# Patient Record
Sex: Male | Born: 1950 | Race: White | Hispanic: No | Marital: Single | Smoking: Former smoker
Health system: Southern US, Community
[De-identification: ages and names within clinical notes are randomized; demographics above are authoritative.]

## PROBLEM LIST (undated history)

## (undated) DIAGNOSIS — D126 Benign neoplasm of colon, unspecified: Secondary | ICD-10-CM

## (undated) DIAGNOSIS — T4145XA Adverse effect of unspecified anesthetic, initial encounter: Secondary | ICD-10-CM

## (undated) DIAGNOSIS — E230 Hypopituitarism: Principal | ICD-10-CM

## (undated) DIAGNOSIS — T8859XA Other complications of anesthesia, initial encounter: Secondary | ICD-10-CM

## (undated) DIAGNOSIS — J45909 Unspecified asthma, uncomplicated: Secondary | ICD-10-CM

## (undated) DIAGNOSIS — F32A Depression, unspecified: Secondary | ICD-10-CM

## (undated) DIAGNOSIS — K219 Gastro-esophageal reflux disease without esophagitis: Secondary | ICD-10-CM

## (undated) DIAGNOSIS — R519 Headache, unspecified: Secondary | ICD-10-CM

## (undated) DIAGNOSIS — F419 Anxiety disorder, unspecified: Secondary | ICD-10-CM

## (undated) DIAGNOSIS — E039 Hypothyroidism, unspecified: Secondary | ICD-10-CM

## (undated) DIAGNOSIS — E232 Diabetes insipidus: Secondary | ICD-10-CM

## (undated) DIAGNOSIS — M1990 Unspecified osteoarthritis, unspecified site: Secondary | ICD-10-CM

## (undated) DIAGNOSIS — F329 Major depressive disorder, single episode, unspecified: Secondary | ICD-10-CM

## (undated) DIAGNOSIS — R51 Headache: Secondary | ICD-10-CM

## (undated) DIAGNOSIS — E785 Hyperlipidemia, unspecified: Secondary | ICD-10-CM

## (undated) DIAGNOSIS — IMO0001 Reserved for inherently not codable concepts without codable children: Secondary | ICD-10-CM

## (undated) DIAGNOSIS — H548 Legal blindness, as defined in USA: Secondary | ICD-10-CM

## (undated) HISTORY — DX: Benign neoplasm of colon, unspecified: D12.6

## (undated) HISTORY — DX: Major depressive disorder, single episode, unspecified: F32.9

## (undated) HISTORY — DX: Depression, unspecified: F32.A

## (undated) HISTORY — PX: BACK SURGERY: SHX140

## (undated) HISTORY — PX: CRANIOTOMY: SHX93

## (undated) HISTORY — DX: Unspecified osteoarthritis, unspecified site: M19.90

## (undated) HISTORY — DX: Hyperlipidemia, unspecified: E78.5

## (undated) HISTORY — PX: APPENDECTOMY: SHX54

## (undated) HISTORY — DX: Hypopituitarism: E23.0

---

## 1996-10-26 HISTORY — PX: SPINE SURGERY: SHX786

## 1999-12-30 ENCOUNTER — Ambulatory Visit (HOSPITAL_COMMUNITY): Admission: RE | Admit: 1999-12-30 | Discharge: 1999-12-30 | Payer: Self-pay | Admitting: General Surgery

## 1999-12-30 ENCOUNTER — Encounter: Payer: Self-pay | Admitting: General Surgery

## 2000-01-28 ENCOUNTER — Encounter (INDEPENDENT_AMBULATORY_CARE_PROVIDER_SITE_OTHER): Payer: Self-pay | Admitting: *Deleted

## 2000-01-28 ENCOUNTER — Ambulatory Visit (HOSPITAL_COMMUNITY): Admission: RE | Admit: 2000-01-28 | Discharge: 2000-01-29 | Payer: Self-pay | Admitting: General Surgery

## 2000-01-28 ENCOUNTER — Encounter: Payer: Self-pay | Admitting: General Surgery

## 2000-02-01 ENCOUNTER — Emergency Department (HOSPITAL_COMMUNITY): Admission: EM | Admit: 2000-02-01 | Discharge: 2000-02-01 | Payer: Self-pay | Admitting: Emergency Medicine

## 2000-07-16 ENCOUNTER — Ambulatory Visit (HOSPITAL_COMMUNITY): Admission: RE | Admit: 2000-07-16 | Discharge: 2000-07-16 | Payer: Self-pay | Admitting: Cardiology

## 2000-10-26 HISTORY — PX: CHOLECYSTECTOMY: SHX55

## 2001-02-22 ENCOUNTER — Encounter: Admission: RE | Admit: 2001-02-22 | Discharge: 2001-02-22 | Payer: Self-pay | Admitting: Gastroenterology

## 2001-02-22 ENCOUNTER — Encounter: Payer: Self-pay | Admitting: Gastroenterology

## 2001-04-05 ENCOUNTER — Emergency Department (HOSPITAL_COMMUNITY): Admission: EM | Admit: 2001-04-05 | Discharge: 2001-04-05 | Payer: Self-pay | Admitting: Emergency Medicine

## 2001-10-10 ENCOUNTER — Emergency Department (HOSPITAL_COMMUNITY): Admission: EM | Admit: 2001-10-10 | Discharge: 2001-10-10 | Payer: Self-pay

## 2001-10-10 ENCOUNTER — Encounter: Payer: Self-pay | Admitting: Emergency Medicine

## 2002-01-12 ENCOUNTER — Encounter: Payer: Self-pay | Admitting: Family Medicine

## 2002-01-12 ENCOUNTER — Encounter: Admission: RE | Admit: 2002-01-12 | Discharge: 2002-01-12 | Payer: Self-pay | Admitting: Family Medicine

## 2002-01-19 ENCOUNTER — Emergency Department (HOSPITAL_COMMUNITY): Admission: EM | Admit: 2002-01-19 | Discharge: 2002-01-19 | Payer: Self-pay | Admitting: *Deleted

## 2002-01-19 ENCOUNTER — Encounter: Payer: Self-pay | Admitting: *Deleted

## 2002-01-26 ENCOUNTER — Encounter: Payer: Self-pay | Admitting: Orthopedic Surgery

## 2002-01-26 ENCOUNTER — Encounter: Admission: RE | Admit: 2002-01-26 | Discharge: 2002-01-26 | Payer: Self-pay | Admitting: Orthopedic Surgery

## 2002-10-26 DIAGNOSIS — D126 Benign neoplasm of colon, unspecified: Secondary | ICD-10-CM

## 2002-10-26 HISTORY — DX: Benign neoplasm of colon, unspecified: D12.6

## 2003-01-20 ENCOUNTER — Emergency Department (HOSPITAL_COMMUNITY): Admission: EM | Admit: 2003-01-20 | Discharge: 2003-01-20 | Payer: Self-pay | Admitting: Emergency Medicine

## 2004-07-17 ENCOUNTER — Ambulatory Visit: Payer: Self-pay | Admitting: Psychiatry

## 2004-07-17 ENCOUNTER — Inpatient Hospital Stay (HOSPITAL_COMMUNITY): Admission: RE | Admit: 2004-07-17 | Discharge: 2004-07-23 | Payer: Self-pay | Admitting: Psychiatry

## 2004-07-24 ENCOUNTER — Other Ambulatory Visit (HOSPITAL_COMMUNITY): Admission: RE | Admit: 2004-07-24 | Discharge: 2004-10-22 | Payer: Self-pay | Admitting: Psychiatry

## 2004-10-26 HISTORY — PX: KNEE ARTHROSCOPY: SUR90

## 2005-07-03 ENCOUNTER — Encounter: Admission: RE | Admit: 2005-07-03 | Discharge: 2005-07-03 | Payer: Self-pay | Admitting: Internal Medicine

## 2007-10-27 HISTORY — PX: JOINT REPLACEMENT: SHX530

## 2007-11-30 ENCOUNTER — Inpatient Hospital Stay (HOSPITAL_COMMUNITY): Admission: RE | Admit: 2007-11-30 | Discharge: 2007-12-04 | Payer: Self-pay | Admitting: Orthopedic Surgery

## 2008-01-24 ENCOUNTER — Ambulatory Visit (HOSPITAL_COMMUNITY): Admission: RE | Admit: 2008-01-24 | Discharge: 2008-01-25 | Payer: Self-pay | Admitting: Orthopedic Surgery

## 2010-04-22 ENCOUNTER — Emergency Department (HOSPITAL_COMMUNITY): Admission: EM | Admit: 2010-04-22 | Discharge: 2010-04-22 | Payer: Self-pay | Admitting: Emergency Medicine

## 2010-10-26 HISTORY — PX: KNEE ARTHROSCOPY: SUR90

## 2011-03-10 NOTE — Op Note (Signed)
Lawrence Moore, Lawrence Moore             ACCOUNT NO.:  [id number]   MEDICAL RECORD NO.:  [id number]          PATIENT TYPE:  INP   LOCATION:  0012                         FACILITY:  [facility]   PHYSICIAN:  Madlyn Frankel. Charlann Boxer, M.D.  DATE OF BIRTH:  04-18-1951   DATE OF PROCEDURE:  11/30/2007  DATE OF DISCHARGE:                               OPERATIVE REPORT   PREOPERATIVE DIAGNOSIS:  Left knee osteoarthritis.   POSTOPERATIVE DIAGNOSIS:  Left knee osteoarthritis.   PROCEDURE:  Left total knee replacement.   COMPONENTS USED:  DePuy rotating platform posterior stabilized knee  system size 4 femur, 4 tibia, 10-mm insert and a 41 patellar button.   SURGEON:  Madlyn Frankel. Charlann Boxer, M.D.   ASSISTANT:  Dwyane Luo, PA   ANESTHESIA:  Duramorph spinal.   DRAINS:  x1.   COMPLICATIONS:  None.   BLOOD LOSS:  50 mL.   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  He had repeated injections with persistent  recurrent swelling and pain posteriorlywith swelling, no signs of  infection.  Given this, persistent discomfort despite conservative  attempts he wished to proceed with definitive measures predictable  definitive results knee replacement discussed, the risks of infection,  DVT, component failure, need for revision surgery, knee stiffness, need  for revision surgery were all discussed.  Consent was obtained.   PROCEDURE IN DETAIL:  The patient brought to the operative theater.  Once adequate anesthesia and preoperative antibiotics, Ancef,  administered the patient was positioned supine and the left thigh  tourniquet placed.  Left lower extremity was pre scrubbed and prepped  and draped in sterile fashion.  Midline incision was made followed by  median parapatellar arthrotomy with patella subluxation rather than  eversion.  Following my initial debridement, I attended to the patella  first with precut measurement 26  to 27 mm, I resected down to 15 mm and  the 41 button.  With the button in place, the caliper measurement was  back to within normal 26 to 27 mm.  Then I attended to the femur.  The  canal was opened and irrigated.  Intramedullary rod was then passed.  At  5 degrees of valgus I resected 10 mm of bone off the distal femur.   I sized the femur to be a size 4 based on the posterior condylar axis  despite a lateral tendency to degenerative change.  There was not an  excessive hyperplastic appearance of the lateral distal femur.   I then made my anterior-posterior chamfer cuts based on this posterior  condylar axis without notching or complication.  Final box cut was made  based off the lateral aspect distal femur.  Attention was now directed  tibia with the tibia subluxated anteriorly, continued meniscectomies  were carried out followed by removal of the cruciate ligaments.   Following this I attended tot he tibia,  I placed an extramedullary  guide and made a cut off the proximal tibia removing approximately 8 mm  off the medial or high side.  Marland Kitchen  I then checked to make sure I had 10 mm  extension block to come out to full extension to make sure that the knee  would come out to full extension with 10 mm block.  Once this was done,  I went ahead and used a size 4 tibial tray. I pinned it in position,  checked the alignment with an alignment rod, was happy that was  perpendicular in both planes.  I then drilled and keel punched this area  and did a trial reduction with 4 femur 4 tibia and 10 mm insert.  The  knee came out to full extension.  The knee ligaments were stable patella  tracked without application of pressure.   This point all trial components removed the synovial capsule layer was  injected 0.25% Marcaine with epinephrine and 1 mL of Toradol for a total  of 60 mL.  The knee was copiously irrigated normal saline solution.  The  final debridements  were carried out.  Cement was mixed and the final  components were cemented into position.  The knee was brought to  extension with 10 mm insert.  Extruded cement was removed.  Once the  cement had cured, I removed excessive cement from the knee.  Once I was  happy that I was unable to visualize any loose fragments of cement the  final 10-mm insert was placed.  The knee was then reirrigated.  A medium  Hemovac drain was placed deep.  The knee was then brought to flexion  with the knee in flexion and extensor mechanism reapproximated using 1-0  Vicryl.  2-0 Vicryl was used in the subcu layer followed by 4-0  Monocryl.  The patient's knee was then cleaned, dried and dressed  sterilely with Steri-Strips and sterile wrap.  He was brought to  recovery room in stable condition.      Madlyn Frankel Charlann Boxer, M.D.  Electronically Signed     MDO/MEDQ  D:  11/30/2007  T:  12/01/2007  Job:  [id number]

## 2011-03-10 NOTE — H&P (Signed)
Lawrence Moore, Lawrence Moore             ACCOUNT NO.:  [id number]   MEDICAL RECORD NO.:  [id number]          PATIENT TYPE:  INP   LOCATION:  NA                           FACILITY:  [facility]   PHYSICIAN:  Madlyn Frankel. Charlann Boxer, M.D.  DATE OF BIRTH:  [birth year]   DATE OF ADMISSION:  11/30/2007  DATE OF DISCHARGE:                              HISTORY & PHYSICAL   PROCEDURE:  Left total knee arthroplasty.   CHIEF COMPLAINT:  Left knee pain.   HISTORY OF PRESENT ILLNESS:  A 60 year old male with a history of right  knee pain secondary to osteoarthritis with a history of the tibial  plateau fracture in 2001 and valgus deformity with lateral compartment  end-stage bone on bone joint disease.  He has been refractory to all  conservative treatment.  He also has a significant history for  panhypopituitarism.  He will require careful management perioperatively  as well as postoperatively.   PAST MEDICAL HISTORY:  1. Significant for osteoarthritis.  2. Panhypopituitarism.  3. Hyperthyroidism.  4. Adrenal insufficiency.  5. Acid reflux.  6. Fibromyalgia.  7. Asthma.  8. Impaired vision with significant vision loss.   PAST SURGICAL HISTORY:  1. 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  2. Cholecystectomy.   FAMILY HISTORY:  Cancer, hypertension, diabetes.   SOCIAL HISTORY:  The patient is single.  Primary caregiver after surgery  will be sister.   DRUG ALLERGIES:  1. IBUPROFEN.  2. TERRAMYCIN.   MEDICATIONS:  1. Ambien 10 mg one-half tablet p.o. q.h.s.  2. Celebrex 200 mg 1 p.o. b.i.d.  3. Combivent 18 mcg 3-4 puffs 4 times a day for asthma.  4. 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  5. Fluoxetine 40 mg 1 p.o. daily.  6. Fosamax 70 mg 1 p.o. q. weekly,  7. Prednisone 5 mg 1 p.o. daily.  8. Seroquel 25 mg 1 p.o. q.h.s.  9. Synthroid 150 mcg 1 p.o. daily.  10.Tegretol XR 200 mg extended release 1 p.o. q.h.s.  11.Tramadol 50 mg 1-2 p.o. q.4-6 h. p.r.n. pain.  12.AndroGel 0.25 gm 2 times a week.  13.Gabapentin 300 mg 2 capsules at night.  14.Pantoprazole 40 mg 1 p.o. daily.  15.Zetia 10 mg 1 p.o. daily.  16.Vitamin D 1000 units 1 p.o. daily.  17.Low-dose aspirin 81 mg 1 p.o. daily.  18.Tylenol #3 p.r.n.   PERIOPERATIVE MEDICATIONS:  See plan.   REVIEW OF SYSTEMS:  None other than HPI.   PHYSICAL EXAMINATION:  VITAL SIGNS:  Pulse 64, respirations 18, blood  pressure 144/82.  GENERAL:  Awake, alert, oriented, well-developed, well-nourished in no  acute distress.  NECK:  Supple.  No carotid bruits.  CHEST:  Lungs are clear to auscultation bilaterally.  BREASTS:  Deferred.  HEART:  Regular rate and rhythm without gallops, clicks, rubs or  murmurs.  ABDOMEN:  Soft, nontender, bowel sounds are present.  GENITOURINARY:  Deferred.  EXTREMITIES:  Left knee has valgus deformity.  Diffuse tenderness.  SKIN:  No cellulitis.  NEUROLOGIC:  Intact  distal sensibilities.   LABORATORY DATA:  Labs and chest x-ray pending.   DIAGNOSTICS:  EKG:  Unremarkable, normal sinus rhythm.   IMPRESSION/PLAN:  1. Left knee osteoarthritis.  2. Panhypopituitarism.  3. Hyperthyroidism.  4. Adrenal insufficiency.  5. Reflux disease.  6. Fibromyalgia.  7. Asthma.   PLAN OF ACTION:  1. Left total knee arthroplasty November 30, 2007 by surgeon Dr.      Durene Romans.  Risks and complications were discussed.  Questions      were encouraged, answered and reviewed.  Postoperative medications      including Lovenox, Robaxin, iron, aspirin, Colace, MiraLax provided      at time of history and physical.  Pain medicine will be provided at      time of surgery.  2. Panhypopituitarism:  He should receive 100 mg of IV Solu-Medrol on      call in the OR on November 30, 2007.  3. Solu-Medrol IV 50 mg every 8 hours for a total of 3 doses after      surgeries, the first dose can be given 8 hours after initial 100 mg       dose.  4. Triple usual dose of hydrocortisone or prednisone for the first 3      days after surgery and then reduce the dose to double his current      dose for the following 7 days.  5. If he has any problems with hypotension or nausea, please contact      Dr. Ocie Cornfield immediately, phone number [phone].  If      unavailable, have staff page Dr. Roanna Raider.  6. Should resume his usual dose of thyroid hormone once daily      beginning the day after surgery.  7. If any additional information needed by anesthesia Nalee Lightle, please      feel free to contact Dr. Oneita Kras office.     ______________________________  Lawrence Moore. Lawrence Moore, [place]      Madlyn Frankel. Charlann Boxer, M.D.  Electronically Signed    BLM/MEDQ  D:  11/28/2007  T:  11/29/2007  Job:  [id number]   cc:   Ocie Cornfield  Fax: [phone]

## 2011-03-10 NOTE — Op Note (Signed)
NAMEARMONI, DEPASS             ACCOUNT NO.:  [id number]   MEDICAL RECORD NO.:  [id number]          PATIENT TYPE:  AMB   LOCATION:  DAY                          FACILITY:  [facility]   PHYSICIAN:  Madlyn Frankel. Charlann Boxer, M.D.  DATE OF BIRTH:  Jul 07, 1951   DATE OF PROCEDURE:  01/24/2008  DATE OF DISCHARGE:                               OPERATIVE REPORT   PREOPERATIVE DIAGNOSIS:  Stiff, painful, arthrofibrotic left total knee.   POSTOPERATIVE DIAGNOSIS:  Stiff, painful, arthrofibrotic left total  knee.   PROCEDURES:  1. Left knee examination under anesthesia with findings of a range of      motion from approximately 5-10 lacking full extension to 90 degrees      of flexion.  2. Manipulation under anesthesia with post-manipulation findings      consistent with a range of motion of 5 to 130 degrees.   SURGEON:  Madlyn Frankel. Charlann Boxer, M.D.   ASSISTANT:  None.   ANESTHESIA:  Spinal plus MAC.   COMPLICATIONS:  None.   INDICATION FOR PROCEDURE:  Mr. Klippel is a 60 year old gentleman who  is now a little over 6 weeks from his left total knee.  He has had no  postoperative complications but did develop some stiffness with limited  progress with therapy.  Due to the clinical presentation and the limited  range of motion, lack of success at improving his motion without  significant pain relief, we discussed options.  He wished to proceed  with the recommendations for manipulation under anesthesia, which I  fully supported.  Risks and benefits were discussed including the  potential for lack of progression despite manipulation effort.   PROCEDURE IN DETAIL:  The patient was brought to the operative theater.  Once spinal anesthesia was established and MAC anesthetic provided,  examination was carried out.  The patient's flexion contracture as well  as limited flexion were noted.  At this point I worked on flexion.  The  hip flexed, I applied pressure to the proximal tibia and the distal  femur.  I  had a very audible and palpable lysis of adhesions without  complication.  Patellar mobility seemed to improve.  I worked a bit on  extension just applying force to the distal femur.   I feel that there was a little bit of lysis and stretching the capsular  tissues without complications.   He was brought to the recovery room, tolerating the procedure well.  He  will be placed in immediate postoperative CPM and spend the night and  then attend therapy tomorrow.      Madlyn Frankel Charlann Boxer, M.D.  Electronically Signed     MDO/MEDQ  D:  01/24/2008  T:  01/24/2008  Job:  [id number]

## 2011-03-13 NOTE — Discharge Summary (Signed)
NAMEDEWARD, SEBEK             ACCOUNT NO.:  [id number]   MEDICAL RECORD NO.:  [id number]          PATIENT TYPE:  IPS   LOCATION:  0508                          FACILITY:  [facility]   PHYSICIAN:  Jeanice Lim, M.D. DATE OF BIRTH:  Apr 27, 1951   DATE OF ADMISSION:  07/17/2004  DATE OF DISCHARGE:  07/23/2004                                 DISCHARGE SUMMARY   IDENTIFYING DATA:  This is a 60 year old Caucasian male, single, voluntarily  admitted.  History of previous depression, gradual increase in depression  over the past six months with increased irritability and decreased sleep.   MEDICATIONS:  1.  DDAVP.  2.  Promethazine.  3.  Fosamax.  4.  Prilosec 20 mg b.i.d.  5.  Combivent inhaler.  6.  Zocor.  7.  Prednisone.   MENTAL STATUS EXAM:  The patient was fully alert, pleasant, cooperative,  with a fairly bright affect.  Speech within normal limits.  Mild  irritability.  Thought process goal-directed, logical.  No hallucinations.  Positive suicidal ideation with thoughts of cutting himself.  Cognitively  intact with past ruminating thought of cutting himself.  Insight was fair,  judgment somewhat impaired.   ADMISSION DIAGNOSES:   AXIS I:  Major depressive disorder, recurrent, severe.   AXIS II:  Deferred.   AXIS III:  1.  Status post craniopharyngioma.  2.  Chronic back pain.   AXIS IV:  Moderate, limited support system, and history of medical problems.   AXIS V:  20/55.   The patient was admitted and ordered routine p.r.n. medications, underwent  further monitoring, and was encouraged to participate in individual, group,  and milieu therapy.  He was placed on safety checks, trazodone for  restorative sleep, and was started on Prozac targeting depressive symptoms.  The patient reported strong suicidal urge prior to admission to cut himself  with a knife that had been present for six months, history of rage, racing  thoughts, and reported decreased sleep and weight  gain.  Reported depressive  symptoms have been going on for several months and anger outbursts and  irritability for over a month.  The patient described mild ups and downs  with a gradual decrease in suicidal ideation and stabilization of mood.  The  patient was discharged in improved condition.  Mood was euthymic, affect  brighter.  No dangerous ideation or psychotic symptoms.  The patient was  given medication education, showing a positive response to medications  without any side effects, is aware of the __________  alternative  treatments, and was discharged with:  1.  Tegretol XR 200 mg at night.  2.  Fosamax as taken previously prescribed.  3.  Seroquel 25 mg one-half three at night.  4.  Ambien 10 mg q.h.s. p.r.n.  5.  Prozac 20 mg q.a.m.  6.  Prednisone 5 mg q.a.m.  7.  Lamictal 25 mg q.a.m.  8.  Ultram 50 mg four times a day.  9.  Levothyroxine 125 mg q.a.m.  10. Celebrex 200 mg two q.a.m.  11. DDAVP 0.1 mg q.h.s.  12. Protonix 40 mg b.i.d.  The patient is to follow up with Dr. Almedia Balls on Monday and [place] IOP  program and Dr. Ocie Cornfield at [facility] Thursday, November 3, at 1:45.   DISCHARGE DIAGNOSES:   AXIS I:  Major depressive disorder, recurrent, severe.   AXIS II:  Deferred.   AXIS III:  1.  Status post craniopharyngioma.  2.  Chronic back pain.   AXIS IV:  Moderate, limited support system, and history of medical problems.   AXIS V:  55/60.     Jame   JEM/MEDQ  D:  08/24/2004  T:  08/25/2004  Job:  [id number]

## 2011-03-13 NOTE — Discharge Summary (Signed)
[name], [name]             ACCOUNT NO.:  [id number]   MEDICAL RECORD NO.:  [id number]          PATIENT TYPE:  INP   LOCATION:  1611                         FACILITY:  [facility]   PHYSICIAN:  Madlyn Frankel. Charlann Boxer, M.D.  DATE OF BIRTH:  [birth year]   DATE OF ADMISSION:  11/30/2007  DATE OF DISCHARGE:  12/04/2007                               DISCHARGE SUMMARY   ADMISSION DIAGNOSES:  1. Osteoarthritis.  2. Panhypopituitarism.  3. Hypothyroidism.  4. Adrenal insufficiency.  5. Acid reflux.  6. Fibromyalgia.  7. Asthma.  8. Impaired vision.  9. 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   DISCHARGE DIAGNOSES:  1. Osteoarthritis.  2. Panhypopituitarism.  3. Hypothyroidism.  4. Adrenal insufficiency.  5. Acid reflux.  6. Fibromyalgia.  7. Asthma.  8. Impaired vision.  9. 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  10.Acute blood loss anemia.   CONSULTANTS:  None.   PROCEDURE:  Left total knee replacement by surgeon Madlyn Frankel. Charlann Boxer,  M.D., assistant Yetta Glassman. Loreta Ave, PA-C.   BRIEF HISTORY:  A 60 year old male with a history of right knee pain  secondary to osteoarthritis with a history of tibial plateau fracture in  2001, valgus deformity and lateral compartment, end-stage bone-on-bone  joint disease.  Refractory to all conservative treatment.  Significant  history of panhypopituitarism.   CONSULTATIONS:  None.   LABS:  Preadmission CBC had a hemoglobin of 13.3, hematocrit 38.3.  On  postop day #2 he did have an acute blood loss anemia with a hemoglobin  of 7.2, hematocrit 20.2, was replaced with blood.  At discharge  hemoglobin 9.4, hematocrit 26.6, platelets 152 and stable.  White cell  differential normal.  His INR was 0.9.  PT was 12.8.  Routine chemistry  on admission:  Sodium was 139, potassium 4, glucose 104, creatinine  1.01.  At discharge sodium 137, potassium 4.2, glucose 99 and creatinine  0.98.  Kidney function remained normal with GFR good perfusion with  greater than 60.  His calcium was 8.1.  UA was negative.   CARDIOLOGY:  EKG:  Normal sinus rhythm.   RADIOLOGY:  Chest two-view:  Mild peribronchial cupping compatible with  the patient's history of asthma.  No acute disease.   HOSPITAL COURSE:  The patient underwent left total knee, was admitted to  the orthopedic floor in stable condition.  Seen day #1, afebrile,  hemodynamically stable.  We discontinued the Hemovac.  He was  neurovascularly intact.  He could do straight leg raise.  He was PT, OT,  weightbearing as tolerated.  We restarted his prednisone per  recommendation by medical doctor.  Made good progress on first day with  recommended home health care PT.  Seen day #2.  The knee was dry.  Continued the PT.  DVT prophylaxis was started.  Remained afebrile,  continued to be able to do a straight leg raise and was able to ambulate  100 feet during physical therapy.  On the 7th he had a low hemoglobin,  was  transfused a couple of units of blood.  Hemoglobin and hematocrit  came up to a stable level.  By the 8th he was feeling good.  He was  afebrile.  Lungs were clear.  He was ready for discharge on the 8th.   DISCHARGE DISPOSITION:  Stable and improved condition.  Discharged home  with home health about PT.   DISCHARGE DIET:  Regular.   DISCHARGE WOUND CARE:  Keep dry.   DISCHARGE PHYSICAL THERAPY:  Weightbearing as tolerated with the use of  a rolling walker.   DISCHARGE MEDICATIONS:  1. Lovenox 40 mg subcutaneous q.24 h. x11 days.  2. Robaxin 500 mg p.o. q.6 h. p.r.n. muscle spasm pain.  3. Vicodin 02/3252 mg one to two p.o. q.4-6 h. p.r.n. pain.  4. Iron 325 mg one p.o. t.i.d.  5. Aspirin 325 mg one p.o. daily x4 weeks after Lovenox completed.  6. Colace 199 mg p.o. b.i.d.  7. MiraLax 17 g p.o. daily.  8. Advair 250/50 mcg p.r.n.  9. Gabapentin 300 mg two at bedtime.  10.Celebrex 200 mg one p.o.  b.i.d.  11.Tegretol XR 200 mg one p.o. nightly.  12.Fluoxetine 40 mg one p.o. q.a.m.  13.Pantoprazole 40 mg one p.o. q.a.m.  14.Tramadol 50 mg one p.o. q.4-6 h. p.r.n.  15.Seroquel 25 mg one p.o. nightly.  16.Prednisone.  17.Desmopressin 0.1 mg one p.o. q.a.m. and one p.o. q.p.m.  18.Synthroid 150 mcg one p.o. q.a.m.  19.Alendronate 70 mg once a week on Wednesdays.  20.Aspirin 81 mg.  21.Beano p.r.n.  22.Vitamin D 1000 units one p.o. daily.  23.Combivent __________ two puffs as needed.   DISCHARGE FOLLOWUP:  Follow up with Dr. Charlann Boxer at phone number [phone].     ______________________________  Yetta Glassman. Loreta Ave, [place]      Madlyn Frankel. Charlann Boxer, M.D.  Electronically Signed    BLM/MEDQ  D:  02/06/2008  T:  02/06/2008  Job:  [id number]   cc:   Ocie Cornfield  Fax: [phone]   Dr. Haydee Salter Sena  Fax: [phone]

## 2011-03-13 NOTE — Op Note (Signed)
[facility]  Patient:    ARVIND, MEXICANO                      MRN: [id number] Proc. Date: 01/28/00 Adm. Date:  [id number] Attending:  Brandy Hale CC:         Meliton Rattan, M.D., [facility]             E. Harrold Donath, M.D., [place], [facility] [place]. Derrell Lolling, M.D. (2 copies)                           Operative Report  PREOPERATIVE DIAGNOSIS:  Chronic cholecystitis.  POSTOPERATIVE DIAGNOSIS:  Chronic cholecystitis.  OPERATION:  Laparoscopic cholecystectomy with intraoperative cholangiogram.  SURGEON:  Angelia Mould. Derrell Lolling, M.D.  FIRST ASSISTANT:  Zigmund Daniel, M.D.  OPERATIVE INDICATIONS:  This is a 60 year old white man who presents with a six-week history of intermittent episodes of right upper quadrant pain and anorexia.  The pain is exacerbated by meals.  The pain occurs in the right upper quadrant and radiates to his infrascapular area.  This occurs almost daily.  He has lost a little bit of weight because of reduction in his appetite.  He has had an ultrasound which suggests either a gallbladder polyp or stone.  Common bile duct was normal.  Liver function tests are normal.  He has had a hepatobiliary scan which is fairly normal.  He has hemoccult-negative stool and not other alteration of his bowel habits.  He was offered elective cholecystectomy as a diagnostic and therapeutic maneuver as opposed to further extensive workup.  Because of his classic symptoms and filling defect on his gallbladder ultrasound, both he and  elected to go ahead with cholecystectomy.  OPERATIVE FINDINGS:  The gallbladder looked fairly normal.  It was a little bit  discolored but thin walled, not acutely inflamed and not too many adhesions to t. I am not sure whether there was any palpable defect in the gallbladder or not. The cystic duct was tiny.  The intraoperative cholangiogram was normal.  There  was o dilatation of the biliary tree, no stricture, no filling defect, and no obstruction with good flow of contrast into the duodenum.  The liver looked fairly normal, ust a slightly irregular surface, but normal color.  The stomach and duodenum looked normal.  The small intestine and large intestine looked normal.  There was no ascites.  There was no abnormality of the peritoneal surface and no abnormality of the omentum.  OPERATIVE TECHNIQUE:  Following the induction of general endotracheal anesthesia, the patients abdomen was prepped and draped in sterile fashion.  Marcaine 0.5% with epinephrine was used as local infiltration anesthetic.  A vertical incision was made in the lower rim of the umbilicus.  The fascia was incised in the midline, and the abdominal cavity entered under direct vision.  A 10 mm Hasson trocar was inserted and secured with pursestring suture of 0 Vicryl.  Pneumoperitoneum was  treated.  Video camera was inserted with visualization and findings as described above.  A 10 mm trocar was placed in the subxiphoid area and two 5 mm trocars placed in the right mid abdomen.  The gallbladder was placed on traction.  A few adhesions were taken down around the infundibulum.  We dissected out the cystic  duct and the cystic artery.  The cystic  artery was secured with metal clips and  divided.  I secured the anterior branch and the posterior branch of the cystic artery separately before dividing these.  We had a nice long length of cystic duct. We secured the cystic duct with a metal clip close to the gallbladder.  We opened the cystic duct, and we had clear bile there.  We inserted a cholangiogram catheter.  This was a little bit difficult initially because the cystic duct was so tiny.  Cholangiogram was ultimately obtained, and this looked good.  There was o filling defect.  The intrahepatic and extrahepatic biliary tree looked normal.  There was good flow  of contrast into the duodenum.  The cholangiogram catheter was removed, and the cystic duct was secured with metal clips and divided.  The gallbladder was dissected from its bed with electrocautery and removed through the umbilical port.  The operative field and subphrenic space were copiously irrigated with saline.  The irrigation fluid was clear.  There was no bleeding and no bile leak whatsoever.  Trocars were removed under direct vision, and there was no bleeding from the trocar sites.  The pneumoperitoneum was released.  The fascia of the umbilicus was closed with 0 Vicryl suture.  The skin incisions were closed with subcuticular sutures of 4-0 Vicryl and Steri-Strips.  Clean bandages were placed, and the patient was taken to the recovery room in stable condition.  Estimated blood loss was about 10 cc.  Complications were none. Sponge, needle, and instrument counts were correct. DD:  01/28/00 TD:  01/28/00 Job: [id number]

## 2011-07-17 LAB — BASIC METABOLIC PANEL
BUN: 17
BUN: 9
CO2: 29
Calcium: 8.1 — ABNORMAL LOW
Chloride: 102
Chloride: 103
GFR calc Af Amer: >60
GFR calc non Af Amer: >60
GFR calc non Af Amer: >60
GFR calc non Af Amer: >60
Glucose, Bld: 104 — ABNORMAL HIGH
Glucose, Bld: 99
Potassium: 4
Potassium: 4
Potassium: 4.2
Sodium: 134 — ABNORMAL LOW
Sodium: 137
Sodium: 139

## 2011-07-17 LAB — CBC
HCT: 38.3 — ABNORMAL LOW
Hemoglobin: 8.5 — ABNORMAL LOW
Hemoglobin: 9 — ABNORMAL LOW
MCHC: 34.7
MCHC: 35.1
MCHC: 35.8
MCV: 88.1
MCV: 88.6
Platelets: 155
Platelets: 167
Platelets: 207
RBC: 2.29 — ABNORMAL LOW
RBC: 2.7 — ABNORMAL LOW
RBC: 4.32
RDW: 13.5
RDW: 13.6
WBC: 8.1
WBC: 8.5
WBC: 9.2

## 2011-07-17 LAB — CROSSMATCH

## 2011-07-17 LAB — DIFFERENTIAL
Basophils Absolute: 0
Basophils Relative: 0
Eosinophils Relative: 2
Lymphs Abs: 2
Monocytes Absolute: 0.5
Neutro Abs: 5.5

## 2011-07-17 LAB — URINALYSIS, ROUTINE W REFLEX MICROSCOPIC
Hgb urine dipstick: NEGATIVE
Ketones, ur: NEGATIVE
Specific Gravity, Urine: 1.017
Urobilinogen, UA: 0.2

## 2011-07-17 LAB — ABO/RH: ABO/RH(D): O POS

## 2011-07-17 LAB — PROTIME-INR: INR: 0.9

## 2011-07-17 LAB — HEMOGLOBIN AND HEMATOCRIT, BLOOD: HCT: 21.9 — ABNORMAL LOW

## 2011-07-17 LAB — TYPE AND SCREEN

## 2011-07-20 LAB — DIFFERENTIAL
Basophils Absolute: 0.1
Eosinophils Absolute: 0.1
Lymphocytes Relative: 18
Lymphs Abs: 1.7
Monocytes Absolute: 0.5
Monocytes Relative: 5
Neutro Abs: 6.8
Neutrophils Relative %: 75

## 2011-07-20 LAB — BASIC METABOLIC PANEL
BUN: 13
Calcium: 9.9
Creatinine, Ser: 1.08
GFR calc non Af Amer: >60
Sodium: 141

## 2011-07-20 LAB — CBC
HCT: 39.5
Hemoglobin: 14.1
MCHC: 35.8
MCV: 88.4
Platelets: 232
RBC: 4.47
RDW: 13.9

## 2012-07-19 ENCOUNTER — Other Ambulatory Visit: Payer: Self-pay

## 2012-07-19 DIAGNOSIS — M79609 Pain in unspecified limb: Secondary | ICD-10-CM

## 2012-08-05 ENCOUNTER — Encounter: Payer: Self-pay | Admitting: Vascular Surgery

## 2012-08-08 ENCOUNTER — Encounter: Payer: Self-pay | Admitting: Vascular Surgery

## 2012-08-09 ENCOUNTER — Ambulatory Visit (INDEPENDENT_AMBULATORY_CARE_PROVIDER_SITE_OTHER): Payer: Medicaid Other | Admitting: Vascular Surgery

## 2012-08-09 ENCOUNTER — Encounter (INDEPENDENT_AMBULATORY_CARE_PROVIDER_SITE_OTHER): Payer: Medicare Other | Admitting: *Deleted

## 2012-08-09 ENCOUNTER — Encounter: Payer: Self-pay | Admitting: Vascular Surgery

## 2012-08-09 VITALS — BP 169/68 | HR 76 | Resp 20 | Ht 64.0 in | Wt 213.7 lb

## 2012-08-09 DIAGNOSIS — I739 Peripheral vascular disease, unspecified: Secondary | ICD-10-CM | POA: Insufficient documentation

## 2012-08-09 DIAGNOSIS — I70219 Atherosclerosis of native arteries of extremities with intermittent claudication, unspecified extremity: Secondary | ICD-10-CM

## 2012-08-09 DIAGNOSIS — M79609 Pain in unspecified limb: Secondary | ICD-10-CM

## 2012-08-09 NOTE — Progress Notes (Signed)
V[facility] and [facility]in Specialist of    Patient name: Lawrence Moore MRN: [id number] DOB: [birth year] Sex: male   Referred by: Tresa Endo  Reason for referral:  Chief Complaint  Patient presents with  . PVD    REFERRED BY DR Remi Deter KELLY    HISTORY OF PRESENT ILLNESS: Patient has today for evaluation of bilateral calf burning with exercise. He also reports some tingling in his feet at night which is unassociated. He reports that the pain occurs only with walking in his calves and is relieved with rest. He has no history of tissue loss has no history of DVT. Does have a history of degenerative disc disease in his back and has had left knee replacement.  No past medical history on file.  Past Surgical History  Procedure Date  . Appendectomy   . Cholecystectomy 2002    Lap. cholecystectomy  . 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 Knee arthroscopy 2006    Right Knee  . Spine surgery 1998  . Joint replacement 2009    Left Knee replacement    History   Social History  . Marital Status: Single    Spouse Name: N/A    Number of Children: N/A  . Years of Education: N/A   Occupational History  . Not on file.   Social History Main Topics  . Smoking status: Former Smoker    Types: Cigarettes    Quit date: 01/25/1971  . Smokeless tobacco: Never Used  . Alcohol Use: No  . Drug Use: No  . Sexually Active: Not on file   Other Topics Concern  . Not on file   Social History Narrative  . No narrative on file    Family History  Problem Relation Age of Onset  . Depression Mother   . Hyperlipidemia Mother   . Other Mother     VARICOSE VEINS  . Cancer Father     prostate cancer  . Deep vein thrombosis Father   . Depression Sister   . Hyperlipidemia Sister     Allergies as of 08/09/2012 - Review Complete 08/09/2012  Allergen Reaction Noted  . Ibuprofen  08/05/2012  . Zocor (simvastatin)  08/05/2012    Current Outpatient  Prescriptions on File Prior to Visit  Medication Sig Dispense Refill  . albuterol-ipratropium (COMBIVENT) 18-103 MCG/ACT inhaler Inhale 2 puffs into the lungs every 6 (six) hours as needed.      . calcium citrate (CALCITRATE - DOSED IN MG ELEMENTAL CALCIUM) 950 MG tablet Take 1 tablet by mouth daily.      . celecoxib (CELEBREX) 200 MG capsule Take 200 mg by mouth daily.      . Cholecalciferol (VITAMIN D3) 2000 UNITS TABS Take by mouth.      . desmopressin (DDAVP) 0.1 MG tablet Take 0.1 mg by mouth daily.      Marland Kitchen dicyclomine (BENTYL) 10 MG capsule Take 10 mg by mouth 4 (four) times daily -  before meals and at bedtime.      Marland Kitchen FLUoxetine (PROZAC) 10 MG capsule Take 40 mg by mouth daily.      Marland Kitchen FLUTICASONE PROPIONATE, NASAL, NA Place 50 mcg into the nose.      Marland Kitchen Fluticasone-Salmeterol (ADVAIR) 250-50 MCG/DOSE AEPB Inhale 1 puff into the lungs every 12 (twelve) hours.      . gabapentin (NEURONTIN) 300 MG capsule Take 300 mg by mouth 3 (three) times daily.      Marland Kitchen levothyroxine (SYNTHROID, LEVOTHROID) 150  MCG tablet Take 150 mcg by mouth daily.      . Loperamide-Simethicone (IMODIUM ADVANCED) 2-125 MG TABS Take by mouth.      . pantoprazole (PROTONIX) 40 MG tablet Take 40 mg by mouth daily.      . pravastatin (PRAVACHOL) 40 MG tablet Take 40 mg by mouth daily.      . psyllium (REGULOID) 0.52 G capsule Take 0.52 g by mouth daily.      . QUEtiapine (SEROQUEL) 25 MG tablet Take 25 mg by mouth at bedtime.      . SUMAtriptan (IMITREX) 100 MG tablet Take 100 mg by mouth every 2 (two) hours as needed.      . testosterone (ANDROGEL) 50 MG/5GM GEL Place 5 g onto the skin daily.      . traMADol (ULTRAM) 50 MG tablet Take 50 mg by mouth every 6 (six) hours as needed.         REVIEW OF SYSTEMS:  Positives indicated with an "X"  CARDIOVASCULAR:  [ ]  chest pain   [ ]  chest pressure   [ ]  palpitations   [ ]  orthopnea   [ ]  dyspnea on exertion   [x ] claudication   [ ]  rest pain   [ ]  DVT   [ ]   phlebitis PULMONARY:   [ ]  productive cough   [x ] asthma   [ ]  wheezing NEUROLOGIC:   [ ]  weakness  [ ]  paresthesias  [ ]  aphasia  [ ]  amaurosis  [x ] dizziness HEMATOLOGIC:   [ ]  bleeding problems   [ ]  clotting disorders MUSCULOSKELETAL:  [ ]  joint pain   [ ]  joint swelling GASTROINTESTINAL: [ ]   blood in stool  [ ]   hematemesis GENITOURINARY:  [ ]   dysuria  [ ]   hematuria PSYCHIATRIC:  [ ]  history of major depression INTEGUMENTARY:  [ ]  rashes  [ ]  ulcers CONSTITUTIONAL:  [ ]  fever   [ ]  chills  PHYSICAL EXAMINATION:  General: The patient is a well-nourished male, in no acute distress. Obese Vital signs are BP 169/68  Pulse 76  Resp 20  Ht 5\' 4"  (1.626 m)  Wt 213 lb 11.2 oz (96.934 kg)  BMI 36.68 kg/m2 Pulmonary: There is a good air exchange bilaterally without wheezing or rales. Abdomen: Soft and non-tender with normal pitch bowel sounds. Musculoskeletal: There are no major deformities.  There is no significant extremity pain. Neurologic: No focal weakness or paresthesias are detected, Skin: There are no ulcer or rashes noted. Psychiatric: The patient has normal affect. Cardiovascular: There is a regular rate and rhythm without significant murmur appreciated. Pulse status 2+ radial and 2+ dorsalis pedis pulses bilaterally  VVS Vascular Lab Studies:  Ordered and Independently Reviewed resting lower surety arterial studies revealed normal ankle arm index at rest with normal triphasic waveforms.  Impression and Plan:  The patient has symptoms which are typical of lower surety claudication. However physical exam and his noninvasive studies did not support this. I did explain the potential of iliac occlusive disease which would allow him to continue to have normal pulses and resting pressures. I have recommended exercise noninvasive study. I explained that this is normal this essentially rules out any arterial disease. I offered to proceed with this today. He has another  appointment is unable to do this is a pre-schedule this at his convenience. I did reassure him that regardless this is not limb threatening and would be safe to continue to tolerate even if he  is related to iliac occlusive disease. Also explained that it is indeed related to this and all likelihood he would have a high incidence of being able to be treated with percutaneous intervention. He will notify us when he wants to reschedule A. exercise ankle arm indices in our office    Cataleah Stites [facility] [place] Office: [phone]

## 2012-08-16 ENCOUNTER — Encounter (INDEPENDENT_AMBULATORY_CARE_PROVIDER_SITE_OTHER): Payer: Medicare Other | Admitting: *Deleted

## 2012-08-16 DIAGNOSIS — I739 Peripheral vascular disease, unspecified: Secondary | ICD-10-CM

## 2012-08-16 DIAGNOSIS — I70219 Atherosclerosis of native arteries of extremities with intermittent claudication, unspecified extremity: Secondary | ICD-10-CM

## 2012-08-22 ENCOUNTER — Encounter: Payer: Self-pay | Admitting: Vascular Surgery

## 2012-08-23 ENCOUNTER — Encounter: Payer: Self-pay | Admitting: Vascular Surgery

## 2012-08-23 ENCOUNTER — Ambulatory Visit (INDEPENDENT_AMBULATORY_CARE_PROVIDER_SITE_OTHER): Payer: Medicare Other | Admitting: Vascular Surgery

## 2012-08-23 VITALS — BP 163/79 | HR 102 | Resp 20 | Ht 64.0 in | Wt 213.0 lb

## 2012-08-23 DIAGNOSIS — I739 Peripheral vascular disease, unspecified: Secondary | ICD-10-CM

## 2012-08-23 NOTE — Progress Notes (Signed)
The patient presents today for continued discussion of lower extremity claudication. I had seen him initially several weeks ago. At that time he had a resting noninvasive study which was completely normal and he did have normal pedal pulses. I did feel that for classic for claudication. Subsequently as return for a exercise lower extremity study. This was a markedly abnormal. Again he had normal pressures at rest but had immediate significant drop in both legs with walking and did not have recovery even at 10 minutes.  Past Medical History  Diagnosis Date  . Degenerative joint disease   . Hyperlipidemia   . Depression     History  Substance Use Topics  . Smoking status: Former Smoker    Types: Cigarettes    Quit date: 01/25/1971  . Smokeless tobacco: Never Used  . Alcohol Use: No    Family History  Problem Relation Age of Onset  . Depression Mother   . Hyperlipidemia Mother   . Other Mother     VARICOSE VEINS  . Cancer Father     prostate cancer  . Deep vein thrombosis Father   . Depression Sister   . Hyperlipidemia Sister     Allergies  Allergen Reactions  . Ibuprofen   . Zocor (Simvastatin)     Current outpatient prescriptions:albuterol-ipratropium (COMBIVENT) 18-103 MCG/ACT inhaler, Inhale 2 puffs into the lungs every 6 (six) hours as needed., Disp: , Rfl: ;  calcium citrate (CALCITRATE - DOSED IN MG ELEMENTAL CALCIUM) 950 MG tablet, Take 1 tablet by mouth daily., Disp: , Rfl: ;  celecoxib (CELEBREX) 200 MG capsule, Take 200 mg by mouth daily., Disp: , Rfl: ;  Cholecalciferol (VITAMIN D3) 2000 UNITS TABS, Take by mouth., Disp: , Rfl:  desmopressin (DDAVP) 0.1 MG tablet, Take 0.1 mg by mouth daily., Disp: , Rfl: ;  dicyclomine (BENTYL) 10 MG capsule, Take 10 mg by mouth 4 (four) times daily -  before meals and at bedtime., Disp: , Rfl: ;  FLUoxetine (PROZAC) 10 MG capsule, Take 40 mg by mouth daily., Disp: , Rfl: ;  FLUTICASONE PROPIONATE, NASAL, NA, Place 50 mcg into the  nose., Disp: , Rfl:  Fluticasone-Salmeterol (ADVAIR) 250-50 MCG/DOSE AEPB, Inhale 1 puff into the lungs every 12 (twelve) hours., Disp: , Rfl: ;  gabapentin (NEURONTIN) 300 MG capsule, Take 300 mg by mouth 3 (three) times daily., Disp: , Rfl: ;  levothyroxine (SYNTHROID, LEVOTHROID) 150 MCG tablet, Take 150 mcg by mouth daily., Disp: , Rfl: ;  Loperamide-Simethicone (IMODIUM ADVANCED) 2-125 MG TABS, Take by mouth., Disp: , Rfl:  pantoprazole (PROTONIX) 40 MG tablet, Take 40 mg by mouth daily., Disp: , Rfl: ;  pravastatin (PRAVACHOL) 40 MG tablet, Take 40 mg by mouth daily., Disp: , Rfl: ;  psyllium (REGULOID) 0.52 G capsule, Take 0.52 g by mouth daily., Disp: , Rfl: ;  QUEtiapine (SEROQUEL) 25 MG tablet, Take 25 mg by mouth at bedtime., Disp: , Rfl: ;  SUMAtriptan (IMITREX) 100 MG tablet, Take 100 mg by mouth every 2 (two) hours as needed., Disp: , Rfl:  testosterone (ANDROGEL) 50 MG/5GM GEL, Place 5 g onto the skin daily., Disp: , Rfl: ;  traMADol (ULTRAM) 50 MG tablet, Take 50 mg by mouth every 6 (six) hours as needed., Disp: , Rfl:   BP 163/79  Pulse 102  Resp 20  Ht 5\' 4"  (1.626 m)  Wt 213 lb (96.616 kg)  BMI 36.56 kg/m2  Body mass index is 36.56 kg/(m^2).       Physical  exam is unchanged. He continues to have 2+ dorsalis pedis pulses with no evidence of tissue loss  Impression and plan lower surety claudication related arterial insufficiency. I discussed this at length with the patient have recommended arteriography for further evaluation. I explained that with his symptoms and exercise study most likely this does represent iliac or aortic occlusive disease. Next frequently can be treated with endovascular technique as an outpatient. He lives with his sister and has some health issues with her currently. He'll notify us and he wished to proceed with outpatient arteriography and potential iliac intervention

## 2012-08-29 ENCOUNTER — Other Ambulatory Visit: Payer: Self-pay | Admitting: *Deleted

## 2012-08-30 ENCOUNTER — Encounter (HOSPITAL_COMMUNITY): Payer: Self-pay | Admitting: Pharmacist

## 2012-08-31 ENCOUNTER — Encounter (HOSPITAL_COMMUNITY)
Admission: RE | Disposition: A | Discharge status: Home / Self Care | Payer: Self-pay | Source: Ambulatory Visit | Attending: Vascular Surgery

## 2012-08-31 ENCOUNTER — Ambulatory Visit (HOSPITAL_COMMUNITY)
Admission: RE | Admit: 2012-08-31 | Discharge: 2012-08-31 | Disposition: A | Discharge status: Home / Self Care | Payer: Medicare Other | Source: Ambulatory Visit | Attending: Vascular Surgery | Admitting: Vascular Surgery

## 2012-08-31 DIAGNOSIS — I70219 Atherosclerosis of native arteries of extremities with intermittent claudication, unspecified extremity: Secondary | ICD-10-CM

## 2012-08-31 DIAGNOSIS — E785 Hyperlipidemia, unspecified: Secondary | ICD-10-CM | POA: Insufficient documentation

## 2012-08-31 HISTORY — PX: ABDOMINAL AORTAGRAM: SHX5454

## 2012-08-31 LAB — POCT I-STAT, CHEM 8
BUN: 11 mg/dL (ref 6–23)
Calcium, Ion: 1.17 mmol/L (ref 1.13–1.30)
HCT: 44 % (ref 39.0–52.0)
TCO2: 27 mmol/L (ref 0–100)

## 2012-08-31 SURGERY — ABDOMINAL AORTAGRAM
Anesthesia: LOCAL

## 2012-08-31 MED ORDER — LIDOCAINE HCL (PF) 1 % IJ SOLN
INTRAMUSCULAR | Status: AC
  Filled 2012-08-31: qty 30

## 2012-08-31 MED ORDER — ONDANSETRON HCL 4 MG/2ML IJ SOLN: 4.0000 mg | Freq: Four times a day (QID) | INTRAMUSCULAR | Status: DC | PRN

## 2012-08-31 MED ORDER — OXYCODONE-ACETAMINOPHEN 5-325 MG PO TABS: 1.0000 | ORAL_TABLET | ORAL | Status: DC | PRN

## 2012-08-31 MED ORDER — SODIUM CHLORIDE 0.9 % IV SOLN: INTRAVENOUS | Status: DC

## 2012-08-31 MED ORDER — MIDAZOLAM HCL 2 MG/2ML IJ SOLN
INTRAMUSCULAR | Status: AC
  Filled 2012-08-31: qty 2

## 2012-08-31 MED ORDER — SODIUM CHLORIDE 0.9 % IV SOLN: 1.0000 mL/kg/h | INTRAVENOUS | Status: DC

## 2012-08-31 MED ORDER — ACETAMINOPHEN 325 MG PO TABS: 650.0000 mg | ORAL_TABLET | ORAL | Status: DC | PRN

## 2012-08-31 MED ORDER — HEPARIN (PORCINE) IN NACL 2-0.9 UNIT/ML-% IJ SOLN
INTRAMUSCULAR | Status: AC
  Filled 2012-08-31: qty 1000

## 2012-08-31 MED ORDER — FENTANYL CITRATE 0.05 MG/ML IJ SOLN
INTRAMUSCULAR | Status: AC
  Filled 2012-08-31: qty 2

## 2012-08-31 NOTE — H&P (View-Only) (Signed)
The patient presents today for continued discussion of lower extremity claudication. I had seen him initially several weeks ago. At that time he had a resting noninvasive study which was completely normal and he did have normal pedal pulses. I did feel that for classic for claudication. Subsequently as return for a exercise lower extremity study. This was a markedly abnormal. Again he had normal pressures at rest but had immediate significant drop in both legs with walking and did not have recovery even at 10 minutes.  Past Medical History  Diagnosis Date  . Degenerative joint disease   . Hyperlipidemia   . Depression     History  Substance Use Topics  . Smoking status: Former Smoker    Types: Cigarettes    Quit date: 01/25/1971  . Smokeless tobacco: Never Used  . Alcohol Use: No    Family History  Problem Relation Age of Onset  . Depression Mother   . Hyperlipidemia Mother   . Other Mother     VARICOSE VEINS  . Cancer Father     prostate cancer  . Deep vein thrombosis Father   . Depression Sister   . Hyperlipidemia Sister     Allergies  Allergen Reactions  . Ibuprofen   . Zocor (Simvastatin)     Current outpatient prescriptions:albuterol-ipratropium (COMBIVENT) 18-103 MCG/ACT inhaler, Inhale 2 puffs into the lungs every 6 (six) hours as needed., Disp: , Rfl: ;  calcium citrate (CALCITRATE - DOSED IN MG ELEMENTAL CALCIUM) 950 MG tablet, Take 1 tablet by mouth daily., Disp: , Rfl: ;  celecoxib (CELEBREX) 200 MG capsule, Take 200 mg by mouth daily., Disp: , Rfl: ;  Cholecalciferol (VITAMIN D3) 2000 UNITS TABS, Take by mouth., Disp: , Rfl:  desmopressin (DDAVP) 0.1 MG tablet, Take 0.1 mg by mouth daily., Disp: , Rfl: ;  dicyclomine (BENTYL) 10 MG capsule, Take 10 mg by mouth 4 (four) times daily -  before meals and at bedtime., Disp: , Rfl: ;  FLUoxetine (PROZAC) 10 MG capsule, Take 40 mg by mouth daily., Disp: , Rfl: ;  FLUTICASONE PROPIONATE, NASAL, NA, Place 50 mcg into the  nose., Disp: , Rfl:  Fluticasone-Salmeterol (ADVAIR) 250-50 MCG/DOSE AEPB, Inhale 1 puff into the lungs every 12 (twelve) hours., Disp: , Rfl: ;  gabapentin (NEURONTIN) 300 MG capsule, Take 300 mg by mouth 3 (three) times daily., Disp: , Rfl: ;  levothyroxine (SYNTHROID, LEVOTHROID) 150 MCG tablet, Take 150 mcg by mouth daily., Disp: , Rfl: ;  Loperamide-Simethicone (IMODIUM ADVANCED) 2-125 MG TABS, Take by mouth., Disp: , Rfl:  pantoprazole (PROTONIX) 40 MG tablet, Take 40 mg by mouth daily., Disp: , Rfl: ;  pravastatin (PRAVACHOL) 40 MG tablet, Take 40 mg by mouth daily., Disp: , Rfl: ;  psyllium (REGULOID) 0.52 G capsule, Take 0.52 g by mouth daily., Disp: , Rfl: ;  QUEtiapine (SEROQUEL) 25 MG tablet, Take 25 mg by mouth at bedtime., Disp: , Rfl: ;  SUMAtriptan (IMITREX) 100 MG tablet, Take 100 mg by mouth every 2 (two) hours as needed., Disp: , Rfl:  testosterone (ANDROGEL) 50 MG/5GM GEL, Place 5 g onto the skin daily., Disp: , Rfl: ;  traMADol (ULTRAM) 50 MG tablet, Take 50 mg by mouth every 6 (six) hours as needed., Disp: , Rfl:   BP 163/79  Pulse 102  Resp 20  Ht 5' 4" (1.626 m)  Wt 213 lb (96.616 kg)  BMI 36.56 kg/m2  Body mass index is 36.56 kg/(m^2).       Physical   exam is unchanged. He continues to have 2+ dorsalis pedis pulses with no evidence of tissue loss  Impression and plan lower surety claudication related arterial insufficiency. I discussed this at length with the patient have recommended arteriography for further evaluation. I explained that with his symptoms and exercise study most likely this does represent iliac or aortic occlusive disease. Next frequently can be treated with endovascular technique as an outpatient. He lives with his sister and has some health issues with her currently. He'll notify us and he wished to proceed with outpatient arteriography and potential iliac intervention 

## 2012-08-31 NOTE — Op Note (Signed)
OPERATIVE REPORT  DATE OF SURGERY: 08/31/2012  PATIENT: Lawrence Moore, 61 y.o. male MRN: [id number]  DOB: 1951/05/10  PRE-OPERATIVE DIAGNOSIS: Lateral extremity pain with claudication symptoms  POST-OPERATIVE DIAGNOSIS:  Same  PROCEDURE: Aortogram with bilateral extremity runoff, ultrasound and visualization of access  SURGEON:  Gretta Began, M.D.    ANESTHESIA:  1% lidocaine local and IV sedation  EBL: Minimal ml     BLOOD ADMINISTERED: None  DRAINS: None  SPECIMEN: None  COUNTS CORRECT:  YES  PLAN OF CARE: Holding area   PATIENT DISPOSITION:  PACU - hemodynamically stable  PROCEDURE DETAILS: The patient presents with symptoms of bilateral extremity pain is associated with walking only. Resting noninvasive studies were normal. He did have an exercise lower surety study and had immediate drop of his pressures bilaterally with prolonged recovery time suggesting arterial insufficiency. It was recommended he undergo arteriography for further evaluation  The patient was taken to the peripheral vascular cath lab prepped and draped in usual sterile fashion and both groins. Using SonoSite ultrasound the right common femoral artery was accessed. A guidewire was passed up the level of the suprarenal aorta. A 5 French sheath was passed over the guidewire and a pigtail catheter is positioned level suprarenal aorta. AP projection was undertaken. This showed widely patent single renal arteries bilaterally widely patent mesenteric vessels. The aorta had mild irregularity to at its course down to the level of the bifurcation. The iliac vessels were normal. A lateral aortogram was done to assure that there was not a posterior-based plaque and this indeed also showed mild irregularity with no flow limiting stenosis. Next AP projection was undertaken of the pelvis and bilateral rales. This revealed widely patent common external and internal iliac arteries bilaterally with widely patent profunda  and superficial femoral arteries bilaterally. Patient widely patent popliteal arteries and three-vessel runoff. Left popliteal and some limited visit visualization due to total knee replacement. Patient tolerated the decortication the sheath was pulled after standard groin hold  Findings #1 mild irregularity in the infrarenal aorta down to the level of the aortic bifurcation #2 widely patent iliac segments and widely patent profunda and superficial femoral arteries with three-vessel runoff   Gretta Began, M.D. 08/31/2012 1:54 PM

## 2012-08-31 NOTE — Interval H&P Note (Signed)
History and Physical Interval Note:  08/31/2012 12:54 PM  Lawrence Moore  has presented today for surgery, with the diagnosis of pvd  The various methods of treatment have been discussed with the patient and family. After consideration of risks, benefits and other options for treatment, the patient has consented to  Procedure(s) (LRB) with comments: ABDOMINAL AORTAGRAM (N/A) as a surgical intervention .  The patient's history has been reviewed, patient examined, no change in status, stable for surgery.  I have reviewed the patient's chart and labs.  Questions were answered to the patient's satisfaction.     [name]

## 2012-11-03 ENCOUNTER — Other Ambulatory Visit: Payer: Self-pay | Admitting: Nurse Practitioner

## 2013-12-04 DIAGNOSIS — K589 Irritable bowel syndrome without diarrhea: Secondary | ICD-10-CM | POA: Diagnosis not present

## 2013-12-04 DIAGNOSIS — E785 Hyperlipidemia, unspecified: Secondary | ICD-10-CM | POA: Diagnosis not present

## 2013-12-04 DIAGNOSIS — K219 Gastro-esophageal reflux disease without esophagitis: Secondary | ICD-10-CM | POA: Diagnosis not present

## 2013-12-04 DIAGNOSIS — G4700 Insomnia, unspecified: Secondary | ICD-10-CM | POA: Diagnosis not present

## 2013-12-04 DIAGNOSIS — F329 Major depressive disorder, single episode, unspecified: Secondary | ICD-10-CM | POA: Diagnosis not present

## 2013-12-28 DIAGNOSIS — M810 Age-related osteoporosis without current pathological fracture: Secondary | ICD-10-CM | POA: Diagnosis not present

## 2013-12-28 DIAGNOSIS — E291 Testicular hypofunction: Secondary | ICD-10-CM | POA: Diagnosis not present

## 2013-12-28 DIAGNOSIS — E230 Hypopituitarism: Secondary | ICD-10-CM | POA: Diagnosis not present

## 2013-12-28 DIAGNOSIS — E559 Vitamin D deficiency, unspecified: Secondary | ICD-10-CM | POA: Diagnosis not present

## 2013-12-28 DIAGNOSIS — E2749 Other adrenocortical insufficiency: Secondary | ICD-10-CM | POA: Diagnosis not present

## 2013-12-28 DIAGNOSIS — E038 Other specified hypothyroidism: Secondary | ICD-10-CM | POA: Diagnosis not present

## 2014-01-11 DIAGNOSIS — G43009 Migraine without aura, not intractable, without status migrainosus: Secondary | ICD-10-CM | POA: Diagnosis not present

## 2014-01-11 DIAGNOSIS — M545 Low back pain, unspecified: Secondary | ICD-10-CM | POA: Diagnosis not present

## 2014-01-11 DIAGNOSIS — IMO0002 Reserved for concepts with insufficient information to code with codable children: Secondary | ICD-10-CM | POA: Diagnosis not present

## 2014-01-11 DIAGNOSIS — M48061 Spinal stenosis, lumbar region without neurogenic claudication: Secondary | ICD-10-CM | POA: Diagnosis not present

## 2014-04-16 DIAGNOSIS — M545 Low back pain, unspecified: Secondary | ICD-10-CM | POA: Diagnosis not present

## 2014-04-16 DIAGNOSIS — M810 Age-related osteoporosis without current pathological fracture: Secondary | ICD-10-CM | POA: Diagnosis not present

## 2014-04-16 DIAGNOSIS — M159 Polyosteoarthritis, unspecified: Secondary | ICD-10-CM | POA: Diagnosis not present

## 2014-04-16 DIAGNOSIS — M25559 Pain in unspecified hip: Secondary | ICD-10-CM | POA: Diagnosis not present

## 2014-04-18 DIAGNOSIS — IMO0002 Reserved for concepts with insufficient information to code with codable children: Secondary | ICD-10-CM | POA: Diagnosis not present

## 2014-04-18 DIAGNOSIS — M545 Low back pain, unspecified: Secondary | ICD-10-CM | POA: Diagnosis not present

## 2014-04-18 DIAGNOSIS — M542 Cervicalgia: Secondary | ICD-10-CM | POA: Diagnosis not present

## 2014-05-07 DIAGNOSIS — IMO0002 Reserved for concepts with insufficient information to code with codable children: Secondary | ICD-10-CM | POA: Diagnosis not present

## 2014-05-07 DIAGNOSIS — Z6841 Body Mass Index (BMI) 40.0 and over, adult: Secondary | ICD-10-CM | POA: Diagnosis not present

## 2014-05-17 DIAGNOSIS — E230 Hypopituitarism: Secondary | ICD-10-CM | POA: Diagnosis not present

## 2014-05-17 DIAGNOSIS — K219 Gastro-esophageal reflux disease without esophagitis: Secondary | ICD-10-CM | POA: Diagnosis not present

## 2014-05-17 DIAGNOSIS — F3289 Other specified depressive episodes: Secondary | ICD-10-CM | POA: Diagnosis not present

## 2014-05-17 DIAGNOSIS — F329 Major depressive disorder, single episode, unspecified: Secondary | ICD-10-CM | POA: Diagnosis not present

## 2014-05-28 DIAGNOSIS — IMO0002 Reserved for concepts with insufficient information to code with codable children: Secondary | ICD-10-CM | POA: Diagnosis not present

## 2014-05-30 DIAGNOSIS — M48061 Spinal stenosis, lumbar region without neurogenic claudication: Secondary | ICD-10-CM | POA: Diagnosis not present

## 2014-05-30 DIAGNOSIS — IMO0002 Reserved for concepts with insufficient information to code with codable children: Secondary | ICD-10-CM | POA: Diagnosis not present

## 2014-06-18 DIAGNOSIS — M25529 Pain in unspecified elbow: Secondary | ICD-10-CM | POA: Diagnosis not present

## 2014-06-18 DIAGNOSIS — M7020 Olecranon bursitis, unspecified elbow: Secondary | ICD-10-CM | POA: Diagnosis not present

## 2014-06-18 DIAGNOSIS — M5137 Other intervertebral disc degeneration, lumbosacral region: Secondary | ICD-10-CM | POA: Diagnosis not present

## 2014-06-18 DIAGNOSIS — IMO0002 Reserved for concepts with insufficient information to code with codable children: Secondary | ICD-10-CM | POA: Diagnosis not present

## 2014-06-28 ENCOUNTER — Ambulatory Visit (INDEPENDENT_AMBULATORY_CARE_PROVIDER_SITE_OTHER): Payer: Medicare Other | Admitting: Internal Medicine

## 2014-06-28 ENCOUNTER — Encounter: Payer: Self-pay | Admitting: Internal Medicine

## 2014-06-28 VITALS — BP 124/70 | HR 96 | Temp 98.7°F | Resp 12 | Ht 64.0 in | Wt 216.0 lb

## 2014-06-28 DIAGNOSIS — E230 Hypopituitarism: Secondary | ICD-10-CM | POA: Insufficient documentation

## 2014-06-28 DIAGNOSIS — Z125 Encounter for screening for malignant neoplasm of prostate: Secondary | ICD-10-CM | POA: Diagnosis not present

## 2014-06-28 DIAGNOSIS — M810 Age-related osteoporosis without current pathological fracture: Secondary | ICD-10-CM | POA: Diagnosis not present

## 2014-06-28 HISTORY — DX: Hypopituitarism: E23.0

## 2014-06-28 LAB — BASIC METABOLIC PANEL
BUN: 19 mg/dL (ref 6–23)
CHLORIDE: 103 meq/L (ref 96–112)
CO2: 27 meq/L (ref 19–32)
CREATININE: 1.2 mg/dL (ref 0.4–1.5)
Calcium: 9.1 mg/dL (ref 8.4–10.5)
GFR: 67.09 mL/min (ref >60.00)
Glucose, Bld: 99 mg/dL (ref 70–99)
POTASSIUM: 3.8 meq/L (ref 3.5–5.1)
SODIUM: 138 meq/L (ref 135–145)

## 2014-06-28 LAB — CBC
HCT: 41 % (ref 39.0–52.0)
Hemoglobin: 13.9 g/dL (ref 13.0–17.0)
MCHC: 33.9 g/dL (ref 30.0–36.0)
MCV: 88.6 fl (ref 78.0–100.0)
Platelets: 184 10*3/uL (ref 150.0–400.0)
RBC: 4.63 Mil/uL (ref 4.22–5.81)
RDW: 13.7 % (ref 11.5–15.5)
WBC: 9.4 10*3/uL (ref 4.0–10.5)

## 2014-06-28 LAB — PSA, MEDICARE: PSA: 0.3 ng/mL (ref 0.10–4.00)

## 2014-06-28 LAB — T4, FREE: FREE T4: 1.38 ng/dL (ref 0.60–1.60)

## 2014-06-28 MED ORDER — PREDNISONE 5 MG PO TABS: 5.0000 mg | ORAL_TABLET | Freq: Every day | ORAL | Status: DC

## 2014-06-28 MED ORDER — DESMOPRESSIN ACETATE 0.1 MG PO TABS: ORAL_TABLET | ORAL | Status: DC

## 2014-06-28 MED ORDER — TESTOSTERONE 50 MG/5GM (1%) TD GEL: 2.5000 g | Freq: Every day | TRANSDERMAL | Status: DC

## 2014-06-28 NOTE — Patient Instructions (Addendum)
Please come back for thyroid lab in 6 weeks.  Continue Levothyroxine 150 mcg for now.  Please take the Levothyroxine first thing in am, every day, with water, separated by >4 hours from acid reflux medications, calcium, iron, multivitamins. Eat breakfast >30 min later.  Take calcium with lunch (or pm snack) and dinner). Take Protonix with lunch or dinner.  Restart Androgel 2.5 g daily.  Continue DDAVP 1 tab in am and 1.5 tab in pm.  Continue Prednisone 5 mg daily  Sick day rules:  If you cannot keep anything down, including your Prednisone, please go to the emergency room or your primary care physician office to get steroids injected in the muscle or vein.  If you have a fever (more than 100 Fahrenheit), please double the dose of your Prednisone for the duration of the fever.  Do not run out of your steroid medication.   Please obtain a MedAlert bracelet mentioning "hypopituitarism".  Please stop at the lab.  Please come back for a follow-up appointment in 6 months

## 2014-06-28 NOTE — Progress Notes (Addendum)
Patient ID: Lawrence Moore, male   DOB: 02/14/1951, 63 y.o.   MRN: [id number]   HPI  Lawrence STARNER is a 63 y.o.-year-old male, referred by his PCP, Dr. Scarlette Ar, for management of hypopituitarism and osteoporosis. He has seen Dr. Tamala Julian in the past.   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 He was started on HRT in 1967, in [facility].  Hypothyroidism:  Levothyroxine 150 mcg, taken: - fasting, with water - he takes the LT4 along with all the other meds - after b'fast! - he takes calcium and PPIs - no iron or MVI  No available TFTs to review.  Adrenal insufficiency:  Prednisone 5 mg in am  He was cortisone acetate for a long time >> developed OP. He does have osteoporosis and had fractures, last DEXA scan in 2006.Hydrocortisone gives him itching. No fatigue later in the day.  He does have back pain >> does not sleep well at night No weight gain except 4 lbs since 2013. He is less active 2/2 back pain. He has very increased appetite, especially in last 2 mo.  No diabetes, no HTN.  Hypogonadism:  He was on Androgel 5 g  (shoulders, at night) >> stopped 2 mo ago  No erections now, but was having bothersome erections especially at night. No available PSA, CBC or testosterone levels to review.  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  He drinks >1 gallon fluids a day. No available Na levels to review.  He is not on [place] supplement. He was on [facility] from 1968-1974.   Pt c/o: - + weight gain - + fatigue - + constipation/+ diarrhea - IBS - no hair falling - + depression - + migraines - R - for last few years.   OP: - last DEXA scan: 2013-2014  - improved since 2006 - he was on Fosamax: 1998/1999-2012 - he takes 2x calcium citrate 500 mg in am   I reviewed his chart and he also has a history of PVD, HL, GERD,  depression.  ROS: Constitutional: + weight gain, + fatigue, no subjective hyperthermia/hypothermia Eyes: no blurry vision, no xerophthalmia ENT: no sore throat, no nodules palpated in throat, no dysphagia/odynophagia, no hoarseness, + decreased hearing Cardiovascular: no CP/SOB/palpitations/leg swelling Respiratory: no cough/+ SOB/+ wheezing Gastrointestinal: no N/V/+ D/+ C Musculoskeletal: no muscle/+ joint aches (back) Skin: no rashes Neurological: no tremors/numbness/tingling/dizziness, + HA Psychiatric: + depression/no anxiety  Past Medical History  Diagnosis Date  . Degenerative joint disease   . Hyperlipidemia   . Depression   . Hypopituitarism 06/28/2014   Past Surgical History  Procedure Laterality Date  . Appendectomy    . Cholecystectomy  2002    Lap. cholecystectomy  . 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 of craniopharyngioma  . Knee arthroscopy  2006    Right Knee  . Spine surgery  1998  . Joint replacement  2009    Left Knee replacement   History   Social History  . Marital Status: Single    Spouse Name: N/A    Number of Children: 0   Occupational History  .    Social History Main Topics  . Smoking status: Former Smoker    Types: Cigarettes    Quit date: 2006  . Smokeless tobacco: No  . Alcohol Use: 1 drink wine/beer  .  Drug Use: No   Current Outpatient Prescriptions on File Prior to Visit  Medication Sig Dispense Refill  . albuterol-ipratropium (COMBIVENT) 18-103 MCG/ACT inhaler Inhale 1-2 puffs into the lungs every 6 (six) hours as needed. For shortness of breath      . calcium citrate (CALCITRATE - DOSED IN MG ELEMENTAL CALCIUM) 950 MG tablet Take 1 tablet by mouth daily.      . celecoxib (CELEBREX) 200 MG capsule Take 400 mg by mouth every morning.       . Cholecalciferol (VITAMIN D3) 2000 UNITS TABS Take 2,000 Units by mouth 2 (two) times daily.       Marland Kitchen FIBER, GUAR GUM, PO Take 2 tablets by mouth 2 (two) times daily.      Marland Kitchen  FLUoxetine (PROZAC) 40 MG capsule Take 40 mg by mouth every morning.      . Fluticasone-Salmeterol (ADVAIR) 250-50 MCG/DOSE AEPB Inhale 1 puff into the lungs every 12 (twelve) hours.      . gabapentin (NEURONTIN) 300 MG capsule Take 600 mg by mouth at bedtime.       Marland Kitchen levothyroxine (SYNTHROID, LEVOTHROID) 150 MCG tablet Take 150 mcg by mouth daily before breakfast.       . Loperamide-Simethicone (IMODIUM ADVANCED) 2-125 MG TABS Take 1-2 tablets by mouth 3 (three) times daily as needed. For diarrhea      . pantoprazole (PROTONIX) 40 MG tablet Take 40 mg by mouth daily.      . pravastatin (PRAVACHOL) 40 MG tablet Take 40 mg by mouth at bedtime.       Marland Kitchen QUEtiapine (SEROQUEL) 25 MG tablet Take 25 mg by mouth at bedtime.      . dicyclomine (BENTYL) 10 MG capsule Take 10 mg by mouth 2 (two) times daily.       . fluticasone (FLONASE) 50 MCG/ACT nasal spray Place 2 sprays into both nostrils daily.      . SUMAtriptan (IMITREX) 100 MG tablet Take 100 mg by mouth every 2 (two) hours as needed. For migraines      . traMADol (ULTRAM) 50 MG tablet Take 50 mg by mouth every 6 (six) hours as needed. For pain       No current facility-administered medications on file prior to visit.   Allergies  Allergen Reactions  . Ibuprofen Other (See Comments)    abd pain, cramping  . Zocor [Simvastatin] Other (See Comments)    Liver enzyme elevation   Family History  Problem Relation Age of Onset  . Depression Mother   . Hyperlipidemia Mother   . Other Mother     VARICOSE VEINS  . Cancer Father     prostate cancer  . Deep vein thrombosis Father   . Depression Sister   . Hyperlipidemia Sister    PE: BP 124/70  Pulse 96  Temp(Src) 98.7 F (37.1 C) (Oral)  Resp 12  Ht 5\' 4"  (1.626 m)  Wt 216 lb (97.977 kg)  BMI 37.06 kg/m2  SpO2 97% Wt Readings from Last 3 Encounters:  06/28/14 216 lb (97.977 kg)  08/31/12 212 lb (96.163 kg)  08/31/12 212 lb (96.163 kg)   Constitutional: overweight, in NAD Eyes:  PERRLA, EOMI, no exophthalmos ENT: moist mucous membranes, no thyromegaly, no cervical lymphadenopathy Cardiovascular: RRR, No MRG Respiratory: CTA B Gastrointestinal: abdomen soft, NT, ND, BS+ Musculoskeletal: no deformities, strength intact in all 4 Skin: moist, warm, no rashes Neurological: no tremor with outstretched hands, DTR normal in all 4  ASSESSMENT: 1. Hypopituitarism  2.  Osteoporosis  PLAN:  1. Hypopituitarism A. Central hypothyroidism - she is not taking the Levothyroxine correctly: takes it with Ca and PPI - We discussed correct intake of thyroid hormones, in am, separated by >30 min from breakfast, and >4h from PPI, calcium, iron, MVI. - will recheck fT4 (we cannot use TSH) - since we will separate the LT4 from Ca and PPi >> recheck labs in 6 weeks - continue LT4 150 mcg for now B. Central adrenal insufficiency - we discussed about proper replacement with Prednisone - we need to keep the dose at the minimum dose that allows him to feel well. He appears not overly replaced. - given sick days rules:  If you cannot keep anything down, including your hydrocortisone medication, please go to the emergency room or your primary care physician office to get steroids injected in the muscle or vein.  If you have a fever (more than 100 Fahrenheit), please double the dose of your hydrocortisone for the duration of the fever.  Do not run out of your hydrocortisone medication. - advised for [place] bracelet mentioning "hypopituitarism" (has wallet card only) C. Hypogonadotropic hypogonadism - was on Androgel - applied this correctly, but stopped 2 mo ago 2/2 bothersome erections - he agrees to restart Androgel but at a lower dose 2.5 mg daily. I encouraged him to restart more to help with his osteoporosis (although it will not be a large improvement from such a low dose). - check testosterone, PSA, CBC - needs yearly DREs from PCP - refilled Rx D. ? 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  E. DI - he is on replacement with ddAVP 1 tab in am and 1/2 tab at night >> will continue same doses - 1x nocturia; if he misses hs dose, he cannot sleep 2/2 increased urination, if he misses the am dose >> thirsty all day - will check his sodium - refilled his Rx with same doses  2. Osteoporosis - h/o fractures - BMD improved in last years - was on long-term tx with Fosamax, now on drug holiday - will obtain records from Dr Tamala Julian - separated calcium in 2 doses - continue vit D replacement - will address more in detail at next visit  Office Visit on 06/28/2014  Component Date Value Ref Range Status  . Testosterone 06/28/2014 19* 300 - 890 ng/dL Final   Comment:           [place] Stage       Male              Male                                        I              < 30 ng/dL        < 10 ng/dL                                        II             <  150 ng/dL       < 30 ng/dL                                        III            100-320 ng/dL     < 35 ng/dL                                        IV             200-970 ng/dL     15-40 ng/dL                                        V/Adult        300-890 ng/dL     10-70 ng/dL                             . Sex Hormone Binding 06/28/2014 11* 13 - 71 nmol/L Final  . Testosterone, Free 06/28/2014 5.5* 47.0 - 244.0 pg/mL Final   Comment:                            The concentration of free testosterone is derived from a mathematical                          expression based on constants for the binding of testosterone to sex                          hormone-binding globulin and albumin.  . Testosterone-% Free 06/28/2014 2.9  1.6 - 2.9 % Final  . PSA 06/28/2014 0.30  0.10 - 4.00 ng/ml Final  .  WBC 06/28/2014 9.4  4.0 - 10.5 K/uL Final  . RBC 06/28/2014 4.63  4.22 - 5.81 Mil/uL Final  . Platelets 06/28/2014 184.0  150.0 - 400.0 K/uL Final  . Hemoglobin 06/28/2014 13.9  13.0 - 17.0 g/dL Final  . HCT 06/28/2014 41.0  39.0 - 52.0 % Final  . MCV 06/28/2014 88.6  78.0 - 100.0 fl Final  . MCHC 06/28/2014 33.9  30.0 - 36.0 g/dL Final  . RDW 06/28/2014 13.7  11.5 - 15.5 % Final  . Free T4 06/28/2014 1.38  0.60 - 1.60 ng/dL Final  . Sodium 06/28/2014 138  135 - 145 mEq/L Final  . Potassium 06/28/2014 3.8  3.5 - 5.1 mEq/L Final  . Chloride 06/28/2014 103  96 - 112 mEq/L Final  . CO2 06/28/2014 27  19 - 32 mEq/L Final  . Glucose, Bld 06/28/2014 99  70 - 99 mg/dL Final  . BUN 06/28/2014 19  6 - 23 mg/dL Final  . Creatinine, Ser 06/28/2014 1.2  0.4 - 1.5 mg/dL Final  . Calcium 06/28/2014 9.1  8.4 - 10.5 mg/dL Final  . GFR 06/28/2014 67.09  >60.00 mL/min Final   Testosterone is very low, OTW labs are normal. Continue with plan to start lower dose of testosterone.  - time spent with the patient: 1 hour, of which >50% was spent in obtaining information  about his condition, his symptoms, reviewing previous  evaluations and treatments, counseling him about his condition (please see the discussed topics above), and developing a plan to further investigate it.   We asked for records from Dr Tamala Julian  - pending.  08/02/2014 Received records from Dr Tamala Julian: DEXA scan 01/07/2014: - spine: T-score -2.5 (+ 11.1% since 12/02/2011)  - LFN: T-score: -2.1

## 2014-06-29 ENCOUNTER — Encounter: Payer: Self-pay | Admitting: Internal Medicine

## 2014-06-29 LAB — TESTOSTERONE, FREE, TOTAL, SHBG
SEX HORMONE BINDING: 11 nmol/L — AB (ref 13–71)
TESTOSTERONE FREE: 5.5 pg/mL — AB (ref 47.0–244.0)
TESTOSTERONE-% FREE: 2.9 % (ref 1.6–2.9)
Testosterone: 19 ng/dL — ABNORMAL LOW (ref 300–890)

## 2014-07-17 DIAGNOSIS — Z6841 Body Mass Index (BMI) 40.0 and over, adult: Secondary | ICD-10-CM | POA: Diagnosis not present

## 2014-07-17 DIAGNOSIS — I10 Essential (primary) hypertension: Secondary | ICD-10-CM | POA: Diagnosis not present

## 2014-07-17 DIAGNOSIS — IMO0002 Reserved for concepts with insufficient information to code with codable children: Secondary | ICD-10-CM | POA: Diagnosis not present

## 2014-07-20 ENCOUNTER — Other Ambulatory Visit: Payer: Self-pay | Admitting: Neurosurgery

## 2014-07-20 ENCOUNTER — Other Ambulatory Visit (HOSPITAL_COMMUNITY): Payer: Self-pay | Admitting: Neurosurgery

## 2014-07-20 DIAGNOSIS — M545 Low back pain, unspecified: Secondary | ICD-10-CM

## 2014-08-03 ENCOUNTER — Encounter (HOSPITAL_COMMUNITY): Payer: Self-pay | Admitting: Pharmacy Technician

## 2014-08-06 ENCOUNTER — Other Ambulatory Visit: Payer: Self-pay | Admitting: *Deleted

## 2014-08-06 MED ORDER — LEVOTHYROXINE SODIUM 150 MCG PO TABS: 150.0000 ug | ORAL_TABLET | Freq: Every day | ORAL | Status: DC

## 2014-08-14 ENCOUNTER — Ambulatory Visit: Payer: Self-pay

## 2014-08-17 ENCOUNTER — Ambulatory Visit (HOSPITAL_COMMUNITY)
Admission: RE | Admit: 2014-08-17 | Discharge: 2014-08-17 | Disposition: A | Discharge status: Home / Self Care | Payer: Medicare Other | Source: Ambulatory Visit | Attending: Neurosurgery | Admitting: Neurosurgery

## 2014-08-17 DIAGNOSIS — M4806 Spinal stenosis, lumbar region: Secondary | ICD-10-CM | POA: Insufficient documentation

## 2014-08-17 DIAGNOSIS — M5125 Other intervertebral disc displacement, thoracolumbar region: Secondary | ICD-10-CM | POA: Insufficient documentation

## 2014-08-17 DIAGNOSIS — M545 Low back pain, unspecified: Secondary | ICD-10-CM

## 2014-08-17 DIAGNOSIS — M5126 Other intervertebral disc displacement, lumbar region: Secondary | ICD-10-CM | POA: Insufficient documentation

## 2014-08-17 DIAGNOSIS — M549 Dorsalgia, unspecified: Secondary | ICD-10-CM | POA: Diagnosis present

## 2014-08-17 DIAGNOSIS — M5127 Other intervertebral disc displacement, lumbosacral region: Secondary | ICD-10-CM | POA: Diagnosis not present

## 2014-08-17 MED ORDER — DIAZEPAM 5 MG PO TABS
10.0000 mg | ORAL_TABLET | Freq: Once | ORAL | Status: AC
  Administered 2014-08-17: 10 mg via ORAL

## 2014-08-17 MED ORDER — ONDANSETRON HCL 4 MG/2ML IJ SOLN: 4.0000 mg | Freq: Four times a day (QID) | INTRAMUSCULAR | Status: DC | PRN

## 2014-08-17 MED ORDER — OXYCODONE HCL 5 MG PO TABS: 5.0000 mg | ORAL_TABLET | ORAL | Status: DC | PRN

## 2014-08-17 MED ORDER — IOHEXOL 180 MG/ML  SOLN
20.0000 mL | Freq: Once | INTRAMUSCULAR | Status: AC | PRN
  Administered 2014-08-17: 16 mL via INTRATHECAL

## 2014-08-17 MED ORDER — DIAZEPAM 5 MG PO TABS
ORAL_TABLET | ORAL | Status: AC
  Administered 2014-08-17: 10 mg via ORAL
  Filled 2014-08-17: qty 2

## 2014-08-17 NOTE — Discharge Instructions (Signed)
Myelogram and Lumbar Puncture Discharge Instructions ° °1. Go home and rest quietly for the next 24 hours.  It is important to lie flat for the next 24 hours.  Get up only to go to the restroom.  You may lie in the bed or on a couch on your back, your stomach, your left side or your right side.  You may have one pillow under your head.  You may have pillows between your knees while you are on your side or under your knees while you are on your back. ° °2. DO NOT drive today.  Recline the seat as far back as it will go, while still wearing your seat belt, on the way home. ° °3. You may get up to go to the bathroom as needed.  You may sit up for 10 minutes to eat.  You may resume your normal diet and medications unless otherwise indicated. ° °4. The incidence of headache, nausea, or vomiting is about 5% (one in 20 patients).  If you develop a headache, lie flat and drink plenty of fluids until the headache goes away.  Caffeinated beverages may be helpful.  If you develop severe nausea and vomiting or a headache that does not go away with flat bed rest, call Dr Cabbell. ° °5. You may resume normal activities after your 24 hours of bed rest is over; however, do not exert yourself strongly or do any heavy lifting tomorrow. ° °6. Call your physician for a follow-up appointment.  The results of your myelogram will be sent directly to your physician by the following day. ° °7. If you have any questions or if complications develop after you arrive home, please call Dr Cabbell. ° °Discharge instructions have been explained to the patient.  The patient, or the person responsible for the patient, fully understands these instructions. ° ° °

## 2014-09-04 DIAGNOSIS — R030 Elevated blood-pressure reading, without diagnosis of hypertension: Secondary | ICD-10-CM | POA: Diagnosis not present

## 2014-09-04 DIAGNOSIS — M5117 Intervertebral disc disorders with radiculopathy, lumbosacral region: Secondary | ICD-10-CM | POA: Diagnosis not present

## 2014-09-04 DIAGNOSIS — Z6841 Body Mass Index (BMI) 40.0 and over, adult: Secondary | ICD-10-CM | POA: Diagnosis not present

## 2014-09-06 ENCOUNTER — Other Ambulatory Visit (HOSPITAL_COMMUNITY): Payer: Self-pay | Admitting: Neurosurgery

## 2014-10-03 ENCOUNTER — Telehealth: Payer: Self-pay | Admitting: Internal Medicine

## 2014-10-03 ENCOUNTER — Encounter (HOSPITAL_COMMUNITY)
Admission: RE | Admit: 2014-10-03 | Discharge: 2014-10-03 | Disposition: A | Discharge status: Home / Self Care | Payer: Medicare Other | Source: Ambulatory Visit | Attending: Neurosurgery | Admitting: Neurosurgery

## 2014-10-03 ENCOUNTER — Encounter (HOSPITAL_COMMUNITY): Payer: Self-pay

## 2014-10-03 DIAGNOSIS — Z87891 Personal history of nicotine dependence: Secondary | ICD-10-CM | POA: Insufficient documentation

## 2014-10-03 DIAGNOSIS — H548 Legal blindness, as defined in USA: Secondary | ICD-10-CM | POA: Insufficient documentation

## 2014-10-03 DIAGNOSIS — Z6837 Body mass index (BMI) 37.0-37.9, adult: Secondary | ICD-10-CM | POA: Insufficient documentation

## 2014-10-03 DIAGNOSIS — E2749 Other adrenocortical insufficiency: Secondary | ICD-10-CM | POA: Insufficient documentation

## 2014-10-03 DIAGNOSIS — E039 Hypothyroidism, unspecified: Secondary | ICD-10-CM | POA: Insufficient documentation

## 2014-10-03 DIAGNOSIS — J45909 Unspecified asthma, uncomplicated: Secondary | ICD-10-CM | POA: Diagnosis not present

## 2014-10-03 DIAGNOSIS — E230 Hypopituitarism: Secondary | ICD-10-CM | POA: Insufficient documentation

## 2014-10-03 DIAGNOSIS — Z01818 Encounter for other preprocedural examination: Secondary | ICD-10-CM | POA: Diagnosis not present

## 2014-10-03 DIAGNOSIS — E785 Hyperlipidemia, unspecified: Secondary | ICD-10-CM | POA: Diagnosis not present

## 2014-10-03 DIAGNOSIS — A159 Respiratory tuberculosis unspecified: Secondary | ICD-10-CM | POA: Diagnosis not present

## 2014-10-03 HISTORY — DX: Unspecified asthma, uncomplicated: J45.909

## 2014-10-03 HISTORY — DX: Gastro-esophageal reflux disease without esophagitis: K21.9

## 2014-10-03 HISTORY — DX: Other complications of anesthesia, initial encounter: T88.59XA

## 2014-10-03 HISTORY — DX: Headache, unspecified: R51.9

## 2014-10-03 HISTORY — DX: Headache: R51

## 2014-10-03 HISTORY — DX: Adverse effect of unspecified anesthetic, initial encounter: T41.45XA

## 2014-10-03 HISTORY — DX: Hypothyroidism, unspecified: E03.9

## 2014-10-03 LAB — CBC
HEMATOCRIT: 44.4 % (ref 39.0–52.0)
Hemoglobin: 15.1 g/dL (ref 13.0–17.0)
MCH: 29.5 pg (ref 26.0–34.0)
MCHC: 34 g/dL (ref 30.0–36.0)
MCV: 86.9 fL (ref 78.0–100.0)
PLATELETS: 196 10*3/uL (ref 150–400)
RBC: 5.11 MIL/uL (ref 4.22–5.81)
RDW: 13.1 % (ref 11.5–15.5)
WBC: 8.3 10*3/uL (ref 4.0–10.5)

## 2014-10-03 LAB — BASIC METABOLIC PANEL
ANION GAP: 15 (ref 5–15)
BUN: 18 mg/dL (ref 6–23)
CALCIUM: 9.4 mg/dL (ref 8.4–10.5)
CO2: 23 mEq/L (ref 19–32)
Chloride: 103 mEq/L (ref 96–112)
Creatinine, Ser: 1.14 mg/dL (ref 0.50–1.35)
GFR calc non Af Amer: 67 mL/min — ABNORMAL LOW (ref >90)
GFR, EST AFRICAN AMERICAN: 78 mL/min — AB (ref >90)
Glucose, Bld: 103 mg/dL — ABNORMAL HIGH (ref 70–99)
Potassium: 4 mEq/L (ref 3.7–5.3)
Sodium: 141 mEq/L (ref 137–147)

## 2014-10-03 LAB — SURGICAL PCR SCREEN
MRSA, PCR: NEGATIVE
STAPHYLOCOCCUS AUREUS: POSITIVE — AB

## 2014-10-03 NOTE — Pre-Procedure Instructions (Addendum)
Lawrence Moore  10/03/2014   Your procedure is scheduled on:  10/12/14  Report to [facility] short stay admitting at 1130 AM.  Call this number if you have problems the morning of surgery: [phone]   Remember:   Do not eat food or drink liquids after midnight.   Take these medicines the morning of surgery with A SIP OF WATER: tylenol; if needed, inhalers bring with you) ,ddvap,prozac,gabapentin, protonix, prednisone, testosterone   Take all meds as ordered until day of surgery except as instructed below or per dr  Bridgette Habermann all herbel meds, nsaids (aleve,naproxen,advil,ibuprofen) 5 days prior to surgery Starting 10/07/14 including vitamins, aspirin celebrex   Do not wear jewelry, make-up or nail polish.  Do not wear lotions, powders, or perfumes. You may wear deodorant.  Do not shave 48 hours prior to surgery. Men may shave face and neck.  Do not bring valuables to the hospital.  [facility] is not responsible                  for any belongings or valuables.               Contacts, dentures or bridgework may not be worn into surgery.  Leave suitcase in the car. After surgery it may be brought to your room.  For patients admitted to the hospital, discharge time is determined by your                treatment team.               Patients discharged the day of surgery will not be allowed to drive  home.  Name and phone number of your driver:   Special Instructions:  Special Instructions: [facility] - Preparing for Surgery  Before surgery, you can play an important role.  Because skin is not sterile, your skin needs to be as free of germs as possible.  You can reduce the number of germs on you skin by washing with CHG (chlorahexidine gluconate) soap before surgery.  CHG is an antiseptic cleaner which kills germs and bonds with the skin to continue killing germs even after washing.  Please DO NOT use if you have an allergy to CHG or antibacterial soaps.  If your skin becomes  reddened/irritated stop using the CHG and inform your nurse when you arrive at Short Stay.  Do not shave (including legs and underarms) for at least 48 hours prior to the first CHG shower.  You may shave your face.  Please follow these instructions carefully:   1.  Shower with CHG Soap the night before surgery and the morning of Surgery.  2.  If you choose to wash your hair, wash your hair first as usual with your normal shampoo.  3.  After you shampoo, rinse your hair and body thoroughly to remove the Shampoo.  4.  Use CHG as you would any other liquid soap.  You can apply chg directly  to the skin and wash gently with scrungie or a clean washcloth.  5.  Apply the CHG Soap to your body ONLY FROM THE NECK DOWN.  Do not use on open wounds or open sores.  Avoid contact with your eyes ears, mouth and genitals (private parts).  Wash genitals (private parts)       with your normal soap.  6.  Wash thoroughly, paying special attention to the area where your surgery will be performed.  7.  Thoroughly rinse your body with warm  water from the neck down.  8.  DO NOT shower/wash with your normal soap after using and rinsing off the CHG Soap.  9.  Pat yourself dry with a clean towel.            10.  Wear clean pajamas.            11.  Place clean sheets on your bed the night of your first shower and do not sleep with pets.  Day of Surgery  Do not apply any lotions/deodorants the morning of surgery.  Please wear clean clothes to the hospital/surgery center.   Please read over the following fact sheets that you were given: Pain Booklet, Coughing and Deep Breathing, MRSA Information and Surgical Site Infection Prevention

## 2014-10-03 NOTE — Telephone Encounter (Signed)
Lawrence Moore at the neurological surgical office [phone]) and advised her per Dr Arman Filter note. She understood and will advise the surgeon.

## 2014-10-03 NOTE — Telephone Encounter (Signed)
Please read notes below and advise.

## 2014-10-03 NOTE — Telephone Encounter (Signed)
He will need stress dose steroids per protocol (likely 50 mg hydrocortisone q6-8h on the day of the surgery), then decrease to his usual dose at discharge. He should be allowed to take his ddAVP doses in am and at bedtime in the day of the surgery. OTW, no change.

## 2014-10-03 NOTE — Progress Notes (Signed)
   10/03/14 1001  OBSTRUCTIVE SLEEP APNEA  Have you ever been diagnosed with sleep apnea through a sleep study? No  Do you snore loudly (loud enough to be heard through closed doors)?  0  Do you often feel tired, fatigued, or sleepy during the daytime? 0  Has anyone observed you stop breathing during your sleep? 0  Do you have, or are you being treated for high blood pressure? 0  BMI more than 35 kg/m2? 1  Age over 62 years old? 1  Neck circumference greater than 40 cm/16 inches? 1 (18)  Gender: 1  Obstructive Sleep Apnea Score 4  Score 4 or greater  Results sent to PCP

## 2014-10-03 NOTE — Telephone Encounter (Signed)
Dr. Lacy Duverney office calling to let us know pt is scheduled for surgery for herniated disc. Is there anything that needs to be done regarding his meds

## 2014-10-03 NOTE — Telephone Encounter (Signed)
[phone] [id number]

## 2014-10-04 ENCOUNTER — Encounter (HOSPITAL_COMMUNITY): Payer: Self-pay | Admitting: Vascular Surgery

## 2014-10-04 NOTE — Progress Notes (Signed)
Pt. Calling to get instructions regarding use of Mupirocin. Pt. Informed to call Pharm. To make sure that they have filled request.

## 2014-10-04 NOTE — Progress Notes (Signed)
Anesthesia Chart Review:  Pt is 63 year old male scheduled for L5-S1 lumbar laminectomy, decompression microdiscectomy on 10/12/2014 with Dr. Christella Noa.   PMH: hypopituitarism (includes diabetes insipidus, hypothyroidism, adrenal insufficiency, hypogonadism), asthma, hyperlidipemia, tuberculosis. Legally blind. BMI 37. Former smoker, 99 pack years).   Medications include: DDAVP, prednisone, combivent, advair, seroquel, neurontin, levothyroxine, pravastatin, imitrex.   Preoperative labs reviewed.    EKG to be obtained DOS.   Per endocrinologist, Dr. Philemon Kingdom, Pt should have high dose steroids per protocol (likely 50mg  hydrocortisone q6-8 hours on DOS), then decrease to his usual dose at discharge. He should take his usual dose of DDAVP in a.m. And at bedtime DOS.   If no changes, I anticipate pt can proceed with surgery as scheduled.   Willeen Cass, FNP-BC [facility] Short Stay Surgical Center/Anesthesiology Phone: [phone] 10/04/2014 2:34 PM

## 2014-10-04 NOTE — Anesthesia Preprocedure Evaluation (Addendum)
Anesthesia Evaluation  Patient identified by MRN, date of birth, ID band Patient awake    Reviewed: Allergy & Precautions, H&P , NPO status , Patient's Chart, lab work & pertinent test results  Airway Mallampati: II  TM Distance: >3 FB Neck ROM: Full    Dental   Pulmonary asthma , former smoker,  breath sounds clear to auscultation        Cardiovascular + Peripheral Vascular Disease Rhythm:Regular     Neuro/Psych  Headaches, Depression    GI/Hepatic Neg liver ROS, GERD-  ,  Endo/Other  Hypothyroidism   Renal/GU negative Renal ROS     Musculoskeletal  (+) Arthritis -,   Abdominal   Peds  Hematology   Anesthesia Other Findings   Reproductive/Obstetrics                          Anesthesia Physical Anesthesia Plan  ASA: III  Anesthesia Plan:    Post-op Pain Management:    Induction: Intravenous  Airway Management Planned: LMA  Additional Equipment:   Intra-op Plan:   Post-operative Plan: Extubation in OR  Informed Consent: I have reviewed the patients History and Physical, chart, labs and discussed the procedure including the risks, benefits and alternatives for the proposed anesthesia with the patient or authorized representative who has indicated his/her understanding and acceptance.   Dental advisory given  Plan Discussed with: CRNA, Anesthesiologist and Surgeon  Anesthesia Plan Comments: (Please see telephone note from Dr. Philemon Kingdom for stress steroid recommendations peri-operatively. )      Anesthesia Quick Evaluation

## 2014-10-11 MED ORDER — CEFAZOLIN SODIUM-DEXTROSE 2-3 GM-% IV SOLR
2.0000 g | INTRAVENOUS | Status: AC
  Administered 2014-10-12: 2 g via INTRAVENOUS
  Filled 2014-10-11: qty 50

## 2014-10-12 ENCOUNTER — Encounter (HOSPITAL_COMMUNITY): Payer: Self-pay | Admitting: *Deleted

## 2014-10-12 ENCOUNTER — Ambulatory Visit (HOSPITAL_COMMUNITY): Payer: Medicare Other

## 2014-10-12 ENCOUNTER — Ambulatory Visit (HOSPITAL_COMMUNITY): Payer: Medicare Other | Admitting: Emergency Medicine

## 2014-10-12 ENCOUNTER — Encounter (HOSPITAL_COMMUNITY)
Admission: RE | Disposition: A | Discharge status: Home / Self Care | Payer: Self-pay | Source: Ambulatory Visit | Attending: Neurosurgery

## 2014-10-12 ENCOUNTER — Ambulatory Visit (HOSPITAL_COMMUNITY)
Admission: RE | Admit: 2014-10-12 | Discharge: 2014-10-13 | Disposition: A | Discharge status: Home / Self Care | Payer: Medicare Other | Source: Ambulatory Visit | Attending: Neurosurgery | Admitting: Neurosurgery

## 2014-10-12 DIAGNOSIS — M5116 Intervertebral disc disorders with radiculopathy, lumbar region: Secondary | ICD-10-CM | POA: Diagnosis not present

## 2014-10-12 DIAGNOSIS — Z23 Encounter for immunization: Secondary | ICD-10-CM | POA: Diagnosis not present

## 2014-10-12 DIAGNOSIS — J45909 Unspecified asthma, uncomplicated: Secondary | ICD-10-CM | POA: Insufficient documentation

## 2014-10-12 DIAGNOSIS — M5117 Intervertebral disc disorders with radiculopathy, lumbosacral region: Secondary | ICD-10-CM | POA: Diagnosis not present

## 2014-10-12 DIAGNOSIS — Z886 Allergy status to analgesic agent status: Secondary | ICD-10-CM | POA: Diagnosis not present

## 2014-10-12 DIAGNOSIS — M5126 Other intervertebral disc displacement, lumbar region: Secondary | ICD-10-CM | POA: Diagnosis present

## 2014-10-12 DIAGNOSIS — E230 Hypopituitarism: Secondary | ICD-10-CM | POA: Insufficient documentation

## 2014-10-12 DIAGNOSIS — Z87891 Personal history of nicotine dependence: Secondary | ICD-10-CM | POA: Diagnosis not present

## 2014-10-12 DIAGNOSIS — M5127 Other intervertebral disc displacement, lumbosacral region: Secondary | ICD-10-CM | POA: Diagnosis present

## 2014-10-12 DIAGNOSIS — Z881 Allergy status to other antibiotic agents status: Secondary | ICD-10-CM | POA: Diagnosis not present

## 2014-10-12 DIAGNOSIS — I739 Peripheral vascular disease, unspecified: Secondary | ICD-10-CM | POA: Insufficient documentation

## 2014-10-12 DIAGNOSIS — Z9889 Other specified postprocedural states: Secondary | ICD-10-CM | POA: Diagnosis not present

## 2014-10-12 DIAGNOSIS — K219 Gastro-esophageal reflux disease without esophagitis: Secondary | ICD-10-CM | POA: Insufficient documentation

## 2014-10-12 DIAGNOSIS — Z419 Encounter for procedure for purposes other than remedying health state, unspecified: Secondary | ICD-10-CM

## 2014-10-12 DIAGNOSIS — M1990 Unspecified osteoarthritis, unspecified site: Secondary | ICD-10-CM | POA: Diagnosis not present

## 2014-10-12 DIAGNOSIS — Z888 Allergy status to other drugs, medicaments and biological substances status: Secondary | ICD-10-CM | POA: Diagnosis not present

## 2014-10-12 DIAGNOSIS — F329 Major depressive disorder, single episode, unspecified: Secondary | ICD-10-CM | POA: Diagnosis not present

## 2014-10-12 HISTORY — DX: Diabetes insipidus: E23.2

## 2014-10-12 HISTORY — PX: LUMBAR LAMINECTOMY/DECOMPRESSION MICRODISCECTOMY: SHX5026

## 2014-10-12 LAB — GLUCOSE, CAPILLARY: Glucose-Capillary: 167 mg/dL — ABNORMAL HIGH (ref 70–99)

## 2014-10-12 SURGERY — LUMBAR LAMINECTOMY/DECOMPRESSION MICRODISCECTOMY 1 LEVEL
Anesthesia: General | Site: Back

## 2014-10-12 MED ORDER — PNEUMOCOCCAL VAC POLYVALENT 25 MCG/0.5ML IJ INJ
0.5000 mL | INJECTION | INTRAMUSCULAR | Status: AC
  Administered 2014-10-13: 0.5 mL via INTRAMUSCULAR
  Filled 2014-10-12: qty 0.5

## 2014-10-12 MED ORDER — MIDAZOLAM HCL 5 MG/5ML IJ SOLN
INTRAMUSCULAR | Status: DC | PRN
  Administered 2014-10-12: 2 mg via INTRAVENOUS

## 2014-10-12 MED ORDER — OXYCODONE-ACETAMINOPHEN 5-325 MG PO TABS
1.0000 | ORAL_TABLET | ORAL | Status: DC | PRN
  Administered 2014-10-12 – 2014-10-13 (×2): 2 via ORAL
  Filled 2014-10-12 (×2): qty 2

## 2014-10-12 MED ORDER — SODIUM CHLORIDE 0.9 % IV SOLN: 250.0000 mL | INTRAVENOUS | Status: DC

## 2014-10-12 MED ORDER — POTASSIUM CHLORIDE IN NACL 20-0.9 MEQ/L-% IV SOLN
INTRAVENOUS | Status: DC
  Filled 2014-10-12 (×3): qty 1000

## 2014-10-12 MED ORDER — FENTANYL CITRATE 0.05 MG/ML IJ SOLN
INTRAMUSCULAR | Status: DC | PRN
  Administered 2014-10-12 (×3): 50 ug via INTRAVENOUS
  Administered 2014-10-12 (×2): 100 ug via INTRAVENOUS
  Administered 2014-10-12: 150 ug via INTRAVENOUS

## 2014-10-12 MED ORDER — DIAZEPAM 5 MG PO TABS: 5.0000 mg | ORAL_TABLET | Freq: Four times a day (QID) | ORAL | Status: DC | PRN

## 2014-10-12 MED ORDER — PROPOFOL 10 MG/ML IV BOLUS
INTRAVENOUS | Status: DC | PRN
  Administered 2014-10-12: 200 mg via INTRAVENOUS

## 2014-10-12 MED ORDER — GABAPENTIN 300 MG PO CAPS: 600.0000 mg | ORAL_CAPSULE | Freq: Two times a day (BID) | ORAL | Status: DC | PRN

## 2014-10-12 MED ORDER — IPRATROPIUM-ALBUTEROL 20-100 MCG/ACT IN AERS: 1.0000 | INHALATION_SPRAY | Freq: Four times a day (QID) | RESPIRATORY_TRACT | Status: DC | PRN

## 2014-10-12 MED ORDER — SODIUM CHLORIDE 0.9 % IJ SOLN: 3.0000 mL | INTRAMUSCULAR | Status: DC | PRN

## 2014-10-12 MED ORDER — PROPOFOL 10 MG/ML IV BOLUS
INTRAVENOUS | Status: AC
  Filled 2014-10-12: qty 20

## 2014-10-12 MED ORDER — CELECOXIB 400 MG PO CAPS
400.0000 mg | ORAL_CAPSULE | Freq: Every day | ORAL | Status: DC
  Administered 2014-10-13: 400 mg via ORAL
  Filled 2014-10-12: qty 1

## 2014-10-12 MED ORDER — ACETAMINOPHEN 650 MG RE SUPP: 650.0000 mg | RECTAL | Status: DC | PRN

## 2014-10-12 MED ORDER — POLYETHYLENE GLYCOL 3350 17 G PO PACK
17.0000 g | PACK | Freq: Every day | ORAL | Status: DC | PRN
  Filled 2014-10-12: qty 1

## 2014-10-12 MED ORDER — MOMETASONE FURO-FORMOTEROL FUM 100-5 MCG/ACT IN AERO
2.0000 | INHALATION_SPRAY | Freq: Two times a day (BID) | RESPIRATORY_TRACT | Status: DC
Start: 2014-10-12 — End: 2014-10-13
  Administered 2014-10-12: 2 via RESPIRATORY_TRACT
  Filled 2014-10-12: qty 8.8

## 2014-10-12 MED ORDER — HYDROCORTISONE NA SUCCINATE PF 100 MG IJ SOLR
INTRAMUSCULAR | Status: DC | PRN
  Administered 2014-10-12: 100 mg via INTRAVENOUS

## 2014-10-12 MED ORDER — FLUOXETINE HCL 20 MG PO TABS
40.0000 mg | ORAL_TABLET | Freq: Every day | ORAL | Status: DC
  Administered 2014-10-13: 40 mg via ORAL
  Filled 2014-10-12: qty 2

## 2014-10-12 MED ORDER — SODIUM CHLORIDE 0.9 % IJ SOLN: 3.0000 mL | Freq: Two times a day (BID) | INTRAMUSCULAR | Status: DC

## 2014-10-12 MED ORDER — HYDROMORPHONE HCL 1 MG/ML IJ SOLN: 0.5000 mg | INTRAMUSCULAR | Status: DC | PRN

## 2014-10-12 MED ORDER — ACETAMINOPHEN 325 MG PO TABS: 650.0000 mg | ORAL_TABLET | ORAL | Status: DC | PRN

## 2014-10-12 MED ORDER — HYDROCORTISONE NA SUCCINATE PF 100 MG IJ SOLR
100.0000 mg | Freq: Once | INTRAMUSCULAR | Status: DC
  Filled 2014-10-12: qty 2

## 2014-10-12 MED ORDER — FENTANYL CITRATE 0.05 MG/ML IJ SOLN
INTRAMUSCULAR | Status: AC
  Filled 2014-10-12: qty 5

## 2014-10-12 MED ORDER — SUMATRIPTAN SUCCINATE 100 MG PO TABS
100.0000 mg | ORAL_TABLET | ORAL | Status: DC | PRN
  Filled 2014-10-12: qty 1

## 2014-10-12 MED ORDER — GLYCOPYRROLATE 0.2 MG/ML IJ SOLN
INTRAMUSCULAR | Status: DC | PRN
  Administered 2014-10-12: 0.6 mg via INTRAVENOUS

## 2014-10-12 MED ORDER — ALBUMIN HUMAN 5 % IV SOLN
INTRAVENOUS | Status: DC | PRN
  Administered 2014-10-12: 16:00:00 via INTRAVENOUS

## 2014-10-12 MED ORDER — INFLUENZA VAC SPLIT QUAD 0.5 ML IM SUSY
0.5000 mL | PREFILLED_SYRINGE | INTRAMUSCULAR | Status: AC
Start: 2014-10-13 — End: 2014-10-13
  Administered 2014-10-13: 0.5 mL via INTRAMUSCULAR
  Filled 2014-10-12: qty 0.5

## 2014-10-12 MED ORDER — FIBER PO POWD: 2.0000 | Freq: Two times a day (BID) | ORAL | Status: DC

## 2014-10-12 MED ORDER — LACTATED RINGERS IV SOLN
INTRAVENOUS | Status: DC
  Administered 2014-10-12: 12:00:00 via INTRAVENOUS

## 2014-10-12 MED ORDER — MENTHOL 3 MG MT LOZG: 1.0000 | LOZENGE | OROMUCOSAL | Status: DC | PRN

## 2014-10-12 MED ORDER — GABAPENTIN 300 MG PO CAPS
600.0000 mg | ORAL_CAPSULE | Freq: Two times a day (BID) | ORAL | Status: DC
  Administered 2014-10-12: 600 mg via ORAL
  Filled 2014-10-12 (×3): qty 2

## 2014-10-12 MED ORDER — GABAPENTIN 300 MG PO CAPS
900.0000 mg | ORAL_CAPSULE | Freq: Every evening | ORAL | Status: DC | PRN
  Filled 2014-10-12: qty 3

## 2014-10-12 MED ORDER — DESMOPRESSIN ACETATE 0.1 MG PO TABS
0.1500 mg | ORAL_TABLET | Freq: Every day | ORAL | Status: DC
  Administered 2014-10-12: 0.15 mg via ORAL
  Filled 2014-10-12 (×2): qty 2

## 2014-10-12 MED ORDER — LIDOCAINE-EPINEPHRINE 0.5 %-1:200000 IJ SOLN
INTRAMUSCULAR | Status: DC | PRN
  Administered 2014-10-12: 10 mL

## 2014-10-12 MED ORDER — HYDROCODONE-ACETAMINOPHEN 5-325 MG PO TABS: 1.0000 | ORAL_TABLET | ORAL | Status: DC | PRN

## 2014-10-12 MED ORDER — ARTIFICIAL TEARS OP OINT
TOPICAL_OINTMENT | OPHTHALMIC | Status: DC | PRN
  Administered 2014-10-12: 1 via OPHTHALMIC

## 2014-10-12 MED ORDER — IPRATROPIUM-ALBUTEROL 0.5-2.5 (3) MG/3ML IN SOLN: 3.0000 mL | Freq: Four times a day (QID) | RESPIRATORY_TRACT | Status: DC | PRN

## 2014-10-12 MED ORDER — ACETAMINOPHEN-CODEINE #4 300-60 MG PO TABS: 1.0000 | ORAL_TABLET | Freq: Two times a day (BID) | ORAL | Status: DC | PRN

## 2014-10-12 MED ORDER — 0.9 % SODIUM CHLORIDE (POUR BTL) OPTIME
TOPICAL | Status: DC | PRN
  Administered 2014-10-12: 1000 mL

## 2014-10-12 MED ORDER — EPHEDRINE SULFATE 50 MG/ML IJ SOLN
INTRAMUSCULAR | Status: DC | PRN
  Administered 2014-10-12: 10 mg via INTRAVENOUS

## 2014-10-12 MED ORDER — CALCIUM POLYCARBOPHIL 625 MG PO TABS
1250.0000 mg | ORAL_TABLET | Freq: Two times a day (BID) | ORAL | Status: DC
  Administered 2014-10-13: 1250 mg via ORAL
  Filled 2014-10-12 (×3): qty 2

## 2014-10-12 MED ORDER — KETOROLAC TROMETHAMINE 30 MG/ML IJ SOLN
30.0000 mg | Freq: Four times a day (QID) | INTRAMUSCULAR | Status: DC
  Administered 2014-10-12 – 2014-10-13 (×2): 30 mg via INTRAVENOUS
  Filled 2014-10-12 (×6): qty 1

## 2014-10-12 MED ORDER — TESTOSTERONE 50 MG/5GM (1%) TD GEL: 2.5000 g | Freq: Every day | TRANSDERMAL | Status: DC

## 2014-10-12 MED ORDER — DESMOPRESSIN ACETATE 0.1 MG PO TABS
0.1000 mg | ORAL_TABLET | Freq: Every day | ORAL | Status: DC
  Administered 2014-10-13: 0.1 mg via ORAL
  Filled 2014-10-12 (×2): qty 1

## 2014-10-12 MED ORDER — LACTATED RINGERS IV SOLN
INTRAVENOUS | Status: DC | PRN
  Administered 2014-10-12: 16:00:00 via INTRAVENOUS

## 2014-10-12 MED ORDER — ONDANSETRON HCL 4 MG/2ML IJ SOLN: 4.0000 mg | INTRAMUSCULAR | Status: DC | PRN

## 2014-10-12 MED ORDER — HYDROMORPHONE HCL 1 MG/ML IJ SOLN
INTRAMUSCULAR | Status: AC
  Filled 2014-10-12: qty 1

## 2014-10-12 MED ORDER — QUETIAPINE FUMARATE 25 MG PO TABS
25.0000 mg | ORAL_TABLET | Freq: Every day | ORAL | Status: DC
  Administered 2014-10-12: 25 mg via ORAL
  Filled 2014-10-12 (×3): qty 1

## 2014-10-12 MED ORDER — ROCURONIUM BROMIDE 100 MG/10ML IV SOLN
INTRAVENOUS | Status: DC | PRN
  Administered 2014-10-12: 40 mg via INTRAVENOUS

## 2014-10-12 MED ORDER — MIDAZOLAM HCL 2 MG/2ML IJ SOLN
INTRAMUSCULAR | Status: AC
  Filled 2014-10-12: qty 2

## 2014-10-12 MED ORDER — NEOSTIGMINE METHYLSULFATE 10 MG/10ML IV SOLN
INTRAVENOUS | Status: DC | PRN
  Administered 2014-10-12: 5 mg via INTRAVENOUS

## 2014-10-12 MED ORDER — PANTOPRAZOLE SODIUM 40 MG PO TBEC
40.0000 mg | DELAYED_RELEASE_TABLET | Freq: Every day | ORAL | Status: DC
  Administered 2014-10-13: 40 mg via ORAL
  Filled 2014-10-12: qty 1

## 2014-10-12 MED ORDER — GABAPENTIN 300 MG PO CAPS
600.0000 mg | ORAL_CAPSULE | Freq: Two times a day (BID) | ORAL | Status: DC
Start: 2014-10-13 — End: 2014-10-12

## 2014-10-12 MED ORDER — PREDNISONE 5 MG PO TABS
5.0000 mg | ORAL_TABLET | Freq: Every day | ORAL | Status: DC
Start: 2014-10-13 — End: 2014-10-13
  Administered 2014-10-13: 5 mg via ORAL
  Filled 2014-10-12: qty 1

## 2014-10-12 MED ORDER — LIDOCAINE HCL (CARDIAC) 20 MG/ML IV SOLN
INTRAVENOUS | Status: DC | PRN
  Administered 2014-10-12: 70 mg via INTRAVENOUS

## 2014-10-12 MED ORDER — PRAVASTATIN SODIUM 40 MG PO TABS
40.0000 mg | ORAL_TABLET | Freq: Every day | ORAL | Status: DC
  Administered 2014-10-12: 40 mg via ORAL
  Filled 2014-10-12 (×2): qty 1

## 2014-10-12 MED ORDER — THROMBIN 5000 UNITS EX SOLR
CUTANEOUS | Status: DC | PRN
  Administered 2014-10-12 (×2): 5000 [IU] via TOPICAL

## 2014-10-12 MED ORDER — VITAMIN D3 25 MCG (1000 UNIT) PO TABS
1000.0000 [IU] | ORAL_TABLET | Freq: Every day | ORAL | Status: DC
  Administered 2014-10-13: 1000 [IU] via ORAL
  Filled 2014-10-12: qty 1

## 2014-10-12 MED ORDER — HYDROMORPHONE HCL 1 MG/ML IJ SOLN
0.2500 mg | INTRAMUSCULAR | Status: DC | PRN
  Administered 2014-10-12 (×2): 0.5 mg via INTRAVENOUS

## 2014-10-12 MED ORDER — HYDROCORTISONE 20 MG PO TABS
50.0000 mg | ORAL_TABLET | Freq: Four times a day (QID) | ORAL | Status: DC
  Administered 2014-10-12 – 2014-10-13 (×2): 50 mg via ORAL
  Filled 2014-10-12 (×7): qty 1

## 2014-10-12 MED ORDER — SENNA 8.6 MG PO TABS
1.0000 | ORAL_TABLET | Freq: Two times a day (BID) | ORAL | Status: DC
  Administered 2014-10-12 – 2014-10-13 (×2): 8.6 mg via ORAL
  Filled 2014-10-12 (×3): qty 1

## 2014-10-12 MED ORDER — ONDANSETRON HCL 4 MG/2ML IJ SOLN
INTRAMUSCULAR | Status: DC | PRN
  Administered 2014-10-12 (×2): 4 mg via INTRAVENOUS

## 2014-10-12 MED ORDER — LEVOTHYROXINE SODIUM 150 MCG PO TABS
150.0000 ug | ORAL_TABLET | Freq: Every day | ORAL | Status: DC
  Administered 2014-10-13: 150 ug via ORAL
  Filled 2014-10-12 (×2): qty 1

## 2014-10-12 MED ORDER — PHENOL 1.4 % MT LIQD
1.0000 | OROMUCOSAL | Status: DC | PRN
  Filled 2014-10-12: qty 177

## 2014-10-12 MED ORDER — HEMOSTATIC AGENTS (NO CHARGE) OPTIME
TOPICAL | Status: DC | PRN
  Administered 2014-10-12: 1 via TOPICAL

## 2014-10-12 SURGICAL SUPPLY — 55 items
BAG DECANTER FOR FLEXI CONT (MISCELLANEOUS) ×3 IMPLANT
BENZOIN TINCTURE PRP APPL 2/3 (GAUZE/BANDAGES/DRESSINGS) IMPLANT
BLADE CLIPPER SURG (BLADE) IMPLANT
BUR MATCHSTICK NEURO 3.0 LAGG (BURR) ×3 IMPLANT
CANISTER SUCT 3000ML (MISCELLANEOUS) ×3 IMPLANT
CLOSURE WOUND 1/2 X4 (GAUZE/BANDAGES/DRESSINGS)
CONT SPEC 4OZ CLIKSEAL STRL BL (MISCELLANEOUS) ×3 IMPLANT
DECANTER SPIKE VIAL GLASS SM (MISCELLANEOUS) ×3 IMPLANT
DRAPE LAPAROTOMY 100X72X124 (DRAPES) ×3 IMPLANT
DRAPE MICROSCOPE LEICA (MISCELLANEOUS) ×3 IMPLANT
DRAPE POUCH INSTRU U-SHP 10X18 (DRAPES) ×3 IMPLANT
DRAPE SURG 17X23 STRL (DRAPES) ×3 IMPLANT
DURAPREP 26ML APPLICATOR (WOUND CARE) ×3 IMPLANT
ELECT REM PT RETURN 9FT ADLT (ELECTROSURGICAL) ×3
ELECTRODE REM PT RTRN 9FT ADLT (ELECTROSURGICAL) ×1 IMPLANT
GAUZE SPONGE 4X4 12PLY STRL (GAUZE/BANDAGES/DRESSINGS) IMPLANT
GAUZE SPONGE 4X4 16PLY XRAY LF (GAUZE/BANDAGES/DRESSINGS) IMPLANT
GLOVE BIOGEL M 8.0 STRL (GLOVE) ×3 IMPLANT
GLOVE BIOGEL PI IND STRL 7.0 (GLOVE) ×1 IMPLANT
GLOVE BIOGEL PI IND STRL 7.5 (GLOVE) ×1 IMPLANT
GLOVE BIOGEL PI INDICATOR 7.0 (GLOVE) ×2
GLOVE BIOGEL PI INDICATOR 7.5 (GLOVE) ×2
GLOVE ECLIPSE 6.5 STRL STRAW (GLOVE) ×3 IMPLANT
GLOVE ECLIPSE 7.5 STRL STRAW (GLOVE) ×6 IMPLANT
GLOVE EXAM NITRILE LRG STRL (GLOVE) IMPLANT
GLOVE EXAM NITRILE MD LF STRL (GLOVE) IMPLANT
GLOVE EXAM NITRILE XL STR (GLOVE) IMPLANT
GLOVE EXAM NITRILE XS STR PU (GLOVE) IMPLANT
GLOVE SS BIOGEL STRL SZ 6.5 (GLOVE) ×2 IMPLANT
GLOVE SUPERSENSE BIOGEL SZ 6.5 (GLOVE) ×4
GOWN STRL REUS W/ TWL LRG LVL3 (GOWN DISPOSABLE) ×2 IMPLANT
GOWN STRL REUS W/ TWL XL LVL3 (GOWN DISPOSABLE) ×2 IMPLANT
GOWN STRL REUS W/TWL 2XL LVL3 (GOWN DISPOSABLE) IMPLANT
GOWN STRL REUS W/TWL LRG LVL3 (GOWN DISPOSABLE) ×4
GOWN STRL REUS W/TWL XL LVL3 (GOWN DISPOSABLE) ×4
KIT BASIN OR (CUSTOM PROCEDURE TRAY) ×3 IMPLANT
KIT ROOM TURNOVER OR (KITS) ×3 IMPLANT
LIQUID BAND (GAUZE/BANDAGES/DRESSINGS) ×3 IMPLANT
NEEDLE HYPO 25X1 1.5 SAFETY (NEEDLE) ×3 IMPLANT
NEEDLE SPNL 18GX3.5 QUINCKE PK (NEEDLE) ×3 IMPLANT
NS IRRIG 1000ML POUR BTL (IV SOLUTION) ×3 IMPLANT
PACK LAMINECTOMY NEURO (CUSTOM PROCEDURE TRAY) ×3 IMPLANT
PAD ARMBOARD 7.5X6 YLW CONV (MISCELLANEOUS) ×9 IMPLANT
RUBBERBAND STERILE (MISCELLANEOUS) ×6 IMPLANT
SPONGE LAP 4X18 X RAY DECT (DISPOSABLE) IMPLANT
SPONGE SURGIFOAM ABS GEL SZ50 (HEMOSTASIS) ×3 IMPLANT
STRIP CLOSURE SKIN 1/2X4 (GAUZE/BANDAGES/DRESSINGS) IMPLANT
SUT VIC AB 0 CT1 18XCR BRD8 (SUTURE) ×1 IMPLANT
SUT VIC AB 0 CT1 8-18 (SUTURE) ×2
SUT VIC AB 2-0 CT1 18 (SUTURE) ×3 IMPLANT
SUT VIC AB 3-0 SH 8-18 (SUTURE) ×6 IMPLANT
SYR 20ML ECCENTRIC (SYRINGE) ×3 IMPLANT
TOWEL OR 17X24 6PK STRL BLUE (TOWEL DISPOSABLE) ×3 IMPLANT
TOWEL OR 17X26 10 PK STRL BLUE (TOWEL DISPOSABLE) ×3 IMPLANT
WATER STERILE IRR 1000ML POUR (IV SOLUTION) ×3 IMPLANT

## 2014-10-12 NOTE — Op Note (Signed)
10/12/2014  5:58 PM  PATIENT:  Lawrence Moore  63 y.o. male  PRE-OPERATIVE DIAGNOSIS: Recurrent  lumbar herniated disc L5/S1 right, right S1 radiculopathy  POST-OPERATIVE DIAGNOSIS:Recurrent  lumbar herniated disc L5/S1 right, right S1 radiculopathy  PROCEDURE:  Procedure(s): Redo right Lumbar five-Sacral one microdiskectomy  SURGEON:   Surgeon(s): Ashok Pall, MD Floyce Stakes, MD  ASSISTANTS:botero, ernesto  ANESTHESIA:   general  EBL:  Total I/O In: 1250 [I.V.:1000; IV Piggyback:250] Out: -   BLOOD ADMINISTERED:none  CELL SAVER GIVEN:none  COUNT:per nursing  DRAINS: none   SPECIMEN:  No Specimen  DICTATION: [name] was taken to the operating room, intubated and placed under a general anesthetic without difficulty. He was positioned prone on a Wilson frame with all pressure points padded. His back was prepped and draped in a sterile manner. I opened the skin with a 10 blade and carried the dissection down to the thoracolumbar fascia. I used both sharp dissection and the monopolar cautery to expose the lamina of L5, and S1. I confirmed my location with an intraoperative xray.  I used the drill, Kerrison punches, and curettes to perform a semihemilaminectomy of L5. I used the punches to remove the ligamentum flavum and scar tissue to expose the thecal sac. I brought the microscope into the operative field and with Dr.botero's assistance we started our decompression of the spinal canal, thecal sac and S1 root(s). I cauterized epidural veins overlying the disc space then divided them sharply. I opened the disc space with a 15 blade and proceeded with the discectomy. I used pituitary rongeurs, curettes, and other instruments to remove disc material. After the discectomy was completed we inspected the S1 nerve root and felt it was well decompressed. I explored rostrally, laterally, medially, and caudally and was satisfied with the decompression. I irrigated the wound, then  closed in layers. I approximated the thoracolumbar fascia, subcutaneous, and subcuticular planes with vicryl sutures. I used dermabond for a sterile dressing.   PLAN OF CARE: Admit for overnight observation  PATIENT DISPOSITION:  PACU - hemodynamically stable.   Delay start of Pharmacological VTE agent (>24hrs) due to surgical blood loss or risk of bleeding:  yes

## 2014-10-12 NOTE — Transfer of Care (Signed)
Immediate Anesthesia Transfer of Care Note  Patient: Lawrence Moore  Procedure(s) Performed: Procedure(s) with comments: Lumbar five-Sacral one microdiskectomy (N/A) - Lumbar five-Sacral one microdiskectomy  Patient Location: PACU  Anesthesia Type:General  Level of Consciousness: oriented, sedated, patient cooperative and responds to stimulation  Airway & Oxygen Therapy: Patient Spontanous Breathing and Patient connected to nasal cannula oxygen  Post-op Assessment: Report given to PACU RN, Post -op Vital signs reviewed and stable, Patient moving all extremities and Patient moving all extremities X 4  Post vital signs: Reviewed and stable  Complications: No apparent anesthesia complications

## 2014-10-12 NOTE — H&P (Signed)
Pulse 91  Temp(Src) 97.4 F (36.3 C) (Oral)  Resp 20  Ht 5\' 4"  (1.626 m)  Wt 98.975 kg (218 lb 3.2 oz)  BMI 37.44 kg/m2  SpO2 96%     HISTORY OF PRESENT ILLNESS:                     Mr. Lawrence Moore comes in today for evaluation of pain which he has in his back and right lower extremity for approximately the last year.  He has undergone epidural injections without relief.  He said they may have lasted a week.  He takes Tylenol No. 3 and takes two tablets two to three times a week.  He can remember some days where he has taken four tablets in a day.  The pain has gotten worse over the last six months.  He said that he has absolutely no discomfort on his left side.  It is not hurting quite as bad as when he underwent his first lumbar surgery in 1997 at L5-S1.  He is right handed, 63 years of age, and retired.     In his own words, he has back pain that is going down the right leg.  Daily pain is about 2; other times 12, cannot stand still for long period.  He said their's numbness and tingling in the back of  right leg and down towards the ankle.  He does have a history of migraines.     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  He has arthritis, fibromyalgia, and also has problems with his eyes.              Prior Operations:  He has undergone left knee replacement, cholecystectomy.              Medications and Allergies:  Medications are Tylenol No. 3, Advair, AndroGel, calcium, and Celebrex, Combivent, vitamin D, desmopressin, fiber tablet, paroxetine, gabapentin, Imodium, levothyroxine, pantoprazole, pravastatin, prednisone, quetiapine fumarate, Sumatriptan, Synthroid and vitamin B3.  He is intolerant to ibuprofen and to Zocor tablets.     FAMILY HISTORY:                                             Mother is 50, is in good health with hypertension and arthritis.  Father 75, in good health and had some mini strokes secondary to cerebrovascular disease.     PHYSICAL EXAMINATION:                                Vital signs today, height 64 inches, weight 212 pounds, BMI is 41.4, blood pressure is 137/85, pulse is 91, temperature is 98.4.     On examination, Mr. [name] is alert and oriented x4, and answering all questions appropriately.  Memory, language, attention span, and fund of knowledge are normal.  He is well kempt and is in some obvious distress.  He has antalgic gait.  Negative straight  leg raising.  Reflexes are 2+ at both knees and both ankles.  He has difficulty taking a 14-inch step with his right lower extremity.  He does it with ease on the left side.  He has difficulty with heel walking, but that is mainly an issue of balance.  He can toe walk.  Muscle tone, bulk, and coordination is otherwise normal.  Romberg is negative.  Proprioception is intact in both the upper and lower extremities.  No clonus.  No Hoffmann sign.  Toes are downgoing to plantar stimulation.  He has 2+ reflexes in biceps, triceps, brachioradialis, normal strength in the upper extremities.  Normal sensation including proprioception.  Gait antalgic. Assessment/plan:  Mr. [name] returns today with myelogram and postmyelogram CT of the lumbar spine.  What it shows is that he does have some crowding of the S1 nerve root on the right side, but the nerve root seems to feel fine.  Comparing this to the MRI, which have been done in August, there is again crowding of the S1 nerve root.  I showed this to few of my partners and they believe that given the fact that he has such severe pain in his right lower extremity that, that area cannot be ignored.  I would rather just go ahead and do discectomy that level to see if this would help.  I am not overly optimistic.  I  do not think it is contraindicated,  but the amount of pain he has and the amount of disc space there do not seem to match up.  However, that is the case frequently, so what you see is not always what you get.  I spoke to Mr. [name] about this and spoke frankly about the risks and benefits. The biggest risk being doing the surgery and not improving.  He understands and wishes to proceed,

## 2014-10-12 NOTE — Anesthesia Procedure Notes (Signed)
Procedure Name: Intubation Date/Time: 10/12/2014 3:58 PM Performed by: Jacquiline Doe A Pre-anesthesia Checklist: Patient identified, Timeout performed, Emergency Drugs available, Suction available and Patient being monitored Patient Re-evaluated:Patient Re-evaluated prior to inductionOxygen Delivery Method: Circle system utilized Preoxygenation: Pre-oxygenation with 100% oxygen Intubation Type: IV induction and Cricoid Pressure applied Ventilation: Mask ventilation without difficulty and Oral airway inserted - appropriate to patient size Laryngoscope Size: Mac and 4 Grade View: Grade I Tube type: Oral Tube size: 8.0 mm Number of attempts: 1 Airway Equipment and Method: Stylet Placement Confirmation: ETT inserted through vocal cords under direct vision,  breath sounds checked- equal and bilateral and positive ETCO2 Secured at: 23 cm Tube secured with: Tape Dental Injury: Teeth and Oropharynx as per pre-operative assessment

## 2014-10-13 DIAGNOSIS — M5116 Intervertebral disc disorders with radiculopathy, lumbar region: Secondary | ICD-10-CM | POA: Diagnosis not present

## 2014-10-13 MED ORDER — OXYCODONE-ACETAMINOPHEN 5-325 MG PO TABS: 1.0000 | ORAL_TABLET | ORAL | Status: DC | PRN

## 2014-10-13 MED ORDER — DIAZEPAM 5 MG PO TABS: 5.0000 mg | ORAL_TABLET | Freq: Four times a day (QID) | ORAL | Status: DC | PRN

## 2014-10-13 NOTE — Progress Notes (Signed)
Patient alert and oriented, mae's well, voiding adequate amount of urine, swallowing without difficulty, no c/o pain. Patient discharged home with family. Script and discharged instructions given to patient. Patient and family stated understanding of d/c instructions given and has an appointment with MD. Aisha Anarie Kalish RN 

## 2014-10-13 NOTE — Discharge Summary (Signed)
Physician Discharge Summary  Patient ID: Lawrence Moore MRN: [id number] DOB/AGE: 1951/05/12 63 y.o.  Admit date: 10/12/2014 Discharge date: 10/13/2014  Admission Diagnoses:HNP L5 S1  Discharge Diagnoses: HNP L5 S1, recurrent Active Problems:   HNP (herniated nucleus pulposus), lumbar   Discharged Condition: good  Hospital Course: surgery  Consults: None  Significant Diagnostic Studies: none  Treatments: surgery:   Discharge Exam: Blood pressure 135/71, pulse 78, temperature 98.5 F (36.9 C), temperature source Oral, resp. rate 20, height 5\' 4"  (1.626 m), weight 98.975 kg (218 lb 3.2 oz), SpO2 94 %. normal  Disposition: 01-Home or Self Care  Discharge Instructions    Call MD for:  redness, tenderness, or signs of infection (pain, swelling, redness, odor or green/yellow discharge around incision site)    Complete by:  As directed      Call MD for:  severe uncontrolled pain    Complete by:  As directed      Call MD for:  temperature >100.4    Complete by:  As directed      Diet - low sodium heart healthy    Complete by:  As directed      Increase activity slowly    Complete by:  As directed             Medication List    TAKE these medications        acetaminophen-codeine 300-60 MG per tablet  Commonly known as:  TYLENOL #4  Take 1-2 tablets by mouth 2 (two) times daily as needed for moderate pain.     celecoxib 200 MG capsule  Commonly known as:  CELEBREX  Take 400 mg by mouth every morning.     cholecalciferol 1000 UNITS tablet  Commonly known as:  VITAMIN D  Take 1,000 Units by mouth daily.     COMBIVENT RESPIMAT 20-100 MCG/ACT Aers respimat  Generic drug:  Ipratropium-Albuterol  Inhale 1 puff into the lungs every 6 (six) hours as needed for wheezing or shortness of breath.     desmopressin 0.1 MG tablet  Commonly known as:  DDAVP  Take 0.1-0.15 mg by mouth 2 (two) times daily. Take 1 tablet (0.1 mcg) every morning and 1 1/2 tablet (0.15 mcg)  every night     diazepam 5 MG tablet  Commonly known as:  VALIUM  Take 1 tablet (5 mg total) by mouth every 6 (six) hours as needed for muscle spasms.     FIBER PO  Take 2 capsules by mouth 2 (two) times daily.     FLUoxetine 40 MG capsule  Commonly known as:  PROZAC  Take 40 mg by mouth daily.     Fluticasone-Salmeterol 250-50 MCG/DOSE Aepb  Commonly known as:  ADVAIR  Inhale 1 puff into the lungs daily. Mid afternoon     gabapentin 300 MG capsule  Commonly known as:  NEURONTIN  Take 600-900 mg by mouth 3 times/day as needed-between meals & bedtime (pain).     levothyroxine 150 MCG tablet  Commonly known as:  SYNTHROID, LEVOTHROID  Take 1 tablet (150 mcg total) by mouth daily before breakfast.     oxyCODONE-acetaminophen 5-325 MG per tablet  Commonly known as:  PERCOCET/ROXICET  Take 1-2 tablets by mouth every 4 (four) hours as needed for moderate pain.     pantoprazole 40 MG tablet  Commonly known as:  PROTONIX  Take 40 mg by mouth daily.     pravastatin 40 MG tablet  Commonly known as:  PRAVACHOL  Take  40 mg by mouth at bedtime.     predniSONE 5 MG tablet  Commonly known as:  DELTASONE  Take 1 tablet (5 mg total) by mouth daily.     QUEtiapine 25 MG tablet  Commonly known as:  SEROQUEL  Take 25-50 mg by mouth at bedtime. Based on anxiety level     SUMAtriptan 100 MG tablet  Commonly known as:  IMITREX  Take 100 mg by mouth every 2 (two) hours as needed for migraine.     testosterone 50 MG/5GM (1%) Gel  Commonly known as:  ANDROGEL  Place 2.5 g onto the skin daily.         SignedEarleen Newport 10/13/2014, 10:05 AM

## 2014-10-13 NOTE — Discharge Instructions (Signed)
Wound Care °Leave incision open to air. °You may shower. °Do not scrub directly on incision.  °Do not put any creams, lotions, or ointments on incision. °Activity °Walk each and every day, increasing distance each day. °No lifting greater than 5 lbs.  Avoid bending, arching, and twisting. °No driving for 2 weeks; may ride as a passenger locally. °If provided with back brace, wear when out of bed.  It is not necessary to wear in bed. °Diet °Resume your normal diet.  °Return to Work °Will be discussed at you follow up appointment. °Call Your Doctor If Any of These Occur °Redness, drainage, or swelling at the wound.  °Temperature greater than 101 degrees. °Severe pain not relieved by pain medication. °Incision starts to come apart. °Follow Up Appt °Call today for appointment in 4 weeks ([phone]) or for problems.  If you have any hardware placed in your spine, you will need an x-ray before your appointment. °

## 2014-10-16 ENCOUNTER — Encounter (HOSPITAL_COMMUNITY): Payer: Self-pay | Admitting: Neurosurgery

## 2014-10-18 NOTE — Anesthesia Postprocedure Evaluation (Signed)
  Anesthesia Post-op Note  Patient: Lawrence Moore  Procedure(s) Performed: Procedure(s) with comments: Lumbar five-Sacral one microdiskectomy (N/A) - Lumbar five-Sacral one microdiskectomy  Patient Location: PACU  Anesthesia Type:General  Level of Consciousness: awake  Airway and Oxygen Therapy: Patient Spontanous Breathing  Post-op Pain: mild  Post-op Assessment: Post-op Vital signs reviewed  Post-op Vital Signs: Reviewed  Last Vitals:  Filed Vitals:   10/13/14 0800  BP: 135/71  Pulse: 78  Temp: 36.9 C  Resp: 20    Complications: No apparent anesthesia complications

## 2014-10-18 NOTE — Addendum Note (Signed)
Addendum  created 10/18/14 2032 by Finis Bud, MD   Modules edited: Clinical Notes   Clinical Notes:  File: [id number]

## 2014-11-06 ENCOUNTER — Other Ambulatory Visit: Payer: Self-pay | Admitting: Internal Medicine

## 2014-11-06 ENCOUNTER — Other Ambulatory Visit: Payer: Self-pay

## 2014-11-06 DIAGNOSIS — M7070 Other bursitis of hip, unspecified hip: Secondary | ICD-10-CM | POA: Diagnosis not present

## 2014-11-06 DIAGNOSIS — M150 Primary generalized (osteo)arthritis: Secondary | ICD-10-CM | POA: Diagnosis not present

## 2014-11-06 DIAGNOSIS — M810 Age-related osteoporosis without current pathological fracture: Secondary | ICD-10-CM | POA: Diagnosis not present

## 2014-11-06 MED ORDER — TESTOSTERONE 2 MG/24HR TD PT24: MEDICATED_PATCH | TRANSDERMAL | Status: DC

## 2014-11-13 DIAGNOSIS — M25651 Stiffness of right hip, not elsewhere classified: Secondary | ICD-10-CM | POA: Diagnosis not present

## 2014-11-13 DIAGNOSIS — R293 Abnormal posture: Secondary | ICD-10-CM | POA: Diagnosis not present

## 2014-11-13 DIAGNOSIS — M25551 Pain in right hip: Secondary | ICD-10-CM | POA: Diagnosis not present

## 2014-11-13 DIAGNOSIS — R262 Difficulty in walking, not elsewhere classified: Secondary | ICD-10-CM | POA: Diagnosis not present

## 2014-11-14 DIAGNOSIS — H25093 Other age-related incipient cataract, bilateral: Secondary | ICD-10-CM | POA: Diagnosis not present

## 2014-11-14 DIAGNOSIS — H4720 Unspecified optic atrophy: Secondary | ICD-10-CM | POA: Diagnosis not present

## 2014-11-15 ENCOUNTER — Telehealth: Payer: Self-pay | Admitting: Internal Medicine

## 2014-11-15 DIAGNOSIS — M25551 Pain in right hip: Secondary | ICD-10-CM | POA: Diagnosis not present

## 2014-11-15 DIAGNOSIS — R293 Abnormal posture: Secondary | ICD-10-CM | POA: Diagnosis not present

## 2014-11-15 DIAGNOSIS — M25651 Stiffness of right hip, not elsewhere classified: Secondary | ICD-10-CM | POA: Diagnosis not present

## 2014-11-15 DIAGNOSIS — R262 Difficulty in walking, not elsewhere classified: Secondary | ICD-10-CM | POA: Diagnosis not present

## 2014-11-15 NOTE — Telephone Encounter (Signed)
Called pt and advised him that we were notified from the ins co that they would not cover the Androgel, but would approve the patch. Rx sent to pt's pharmacy on 11/06/14 for the patch. Pt to contact pharmacy.

## 2014-11-15 NOTE — Telephone Encounter (Signed)
Patient stated that ins did not approve his Androgel because it was not on med list, they are saying she would have to override the medication. Please advise

## 2014-11-20 DIAGNOSIS — R293 Abnormal posture: Secondary | ICD-10-CM | POA: Diagnosis not present

## 2014-11-20 DIAGNOSIS — M25651 Stiffness of right hip, not elsewhere classified: Secondary | ICD-10-CM | POA: Diagnosis not present

## 2014-11-20 DIAGNOSIS — R262 Difficulty in walking, not elsewhere classified: Secondary | ICD-10-CM | POA: Diagnosis not present

## 2014-11-20 DIAGNOSIS — M25551 Pain in right hip: Secondary | ICD-10-CM | POA: Diagnosis not present

## 2014-11-27 DIAGNOSIS — M25651 Stiffness of right hip, not elsewhere classified: Secondary | ICD-10-CM | POA: Diagnosis not present

## 2014-11-27 DIAGNOSIS — M25551 Pain in right hip: Secondary | ICD-10-CM | POA: Diagnosis not present

## 2014-11-27 DIAGNOSIS — R262 Difficulty in walking, not elsewhere classified: Secondary | ICD-10-CM | POA: Diagnosis not present

## 2014-11-27 DIAGNOSIS — R293 Abnormal posture: Secondary | ICD-10-CM | POA: Diagnosis not present

## 2014-11-29 DIAGNOSIS — R293 Abnormal posture: Secondary | ICD-10-CM | POA: Diagnosis not present

## 2014-11-29 DIAGNOSIS — M25551 Pain in right hip: Secondary | ICD-10-CM | POA: Diagnosis not present

## 2014-11-29 DIAGNOSIS — R262 Difficulty in walking, not elsewhere classified: Secondary | ICD-10-CM | POA: Diagnosis not present

## 2014-11-29 DIAGNOSIS — M25651 Stiffness of right hip, not elsewhere classified: Secondary | ICD-10-CM | POA: Diagnosis not present

## 2014-12-04 DIAGNOSIS — R293 Abnormal posture: Secondary | ICD-10-CM | POA: Diagnosis not present

## 2014-12-04 DIAGNOSIS — M25651 Stiffness of right hip, not elsewhere classified: Secondary | ICD-10-CM | POA: Diagnosis not present

## 2014-12-04 DIAGNOSIS — M25551 Pain in right hip: Secondary | ICD-10-CM | POA: Diagnosis not present

## 2014-12-04 DIAGNOSIS — R262 Difficulty in walking, not elsewhere classified: Secondary | ICD-10-CM | POA: Diagnosis not present

## 2014-12-06 DIAGNOSIS — R293 Abnormal posture: Secondary | ICD-10-CM | POA: Diagnosis not present

## 2014-12-06 DIAGNOSIS — R262 Difficulty in walking, not elsewhere classified: Secondary | ICD-10-CM | POA: Diagnosis not present

## 2014-12-06 DIAGNOSIS — M25551 Pain in right hip: Secondary | ICD-10-CM | POA: Diagnosis not present

## 2014-12-06 DIAGNOSIS — M25651 Stiffness of right hip, not elsewhere classified: Secondary | ICD-10-CM | POA: Diagnosis not present

## 2014-12-11 DIAGNOSIS — M25651 Stiffness of right hip, not elsewhere classified: Secondary | ICD-10-CM | POA: Diagnosis not present

## 2014-12-11 DIAGNOSIS — R293 Abnormal posture: Secondary | ICD-10-CM | POA: Diagnosis not present

## 2014-12-11 DIAGNOSIS — R262 Difficulty in walking, not elsewhere classified: Secondary | ICD-10-CM | POA: Diagnosis not present

## 2014-12-11 DIAGNOSIS — M25551 Pain in right hip: Secondary | ICD-10-CM | POA: Diagnosis not present

## 2014-12-13 DIAGNOSIS — R293 Abnormal posture: Secondary | ICD-10-CM | POA: Diagnosis not present

## 2014-12-13 DIAGNOSIS — M25651 Stiffness of right hip, not elsewhere classified: Secondary | ICD-10-CM | POA: Diagnosis not present

## 2014-12-13 DIAGNOSIS — M25551 Pain in right hip: Secondary | ICD-10-CM | POA: Diagnosis not present

## 2014-12-13 DIAGNOSIS — R262 Difficulty in walking, not elsewhere classified: Secondary | ICD-10-CM | POA: Diagnosis not present

## 2014-12-14 DIAGNOSIS — F329 Major depressive disorder, single episode, unspecified: Secondary | ICD-10-CM | POA: Diagnosis not present

## 2014-12-14 DIAGNOSIS — K219 Gastro-esophageal reflux disease without esophagitis: Secondary | ICD-10-CM | POA: Diagnosis not present

## 2014-12-14 DIAGNOSIS — J4530 Mild persistent asthma, uncomplicated: Secondary | ICD-10-CM | POA: Diagnosis not present

## 2014-12-14 DIAGNOSIS — E780 Pure hypercholesterolemia: Secondary | ICD-10-CM | POA: Diagnosis not present

## 2014-12-20 DIAGNOSIS — R293 Abnormal posture: Secondary | ICD-10-CM | POA: Diagnosis not present

## 2014-12-20 DIAGNOSIS — M25651 Stiffness of right hip, not elsewhere classified: Secondary | ICD-10-CM | POA: Diagnosis not present

## 2014-12-20 DIAGNOSIS — M25551 Pain in right hip: Secondary | ICD-10-CM | POA: Diagnosis not present

## 2014-12-20 DIAGNOSIS — R262 Difficulty in walking, not elsewhere classified: Secondary | ICD-10-CM | POA: Diagnosis not present

## 2014-12-25 DIAGNOSIS — M25651 Stiffness of right hip, not elsewhere classified: Secondary | ICD-10-CM | POA: Diagnosis not present

## 2014-12-25 DIAGNOSIS — M25551 Pain in right hip: Secondary | ICD-10-CM | POA: Diagnosis not present

## 2014-12-25 DIAGNOSIS — R293 Abnormal posture: Secondary | ICD-10-CM | POA: Diagnosis not present

## 2014-12-25 DIAGNOSIS — R262 Difficulty in walking, not elsewhere classified: Secondary | ICD-10-CM | POA: Diagnosis not present

## 2014-12-27 DIAGNOSIS — M25551 Pain in right hip: Secondary | ICD-10-CM | POA: Diagnosis not present

## 2014-12-27 DIAGNOSIS — R293 Abnormal posture: Secondary | ICD-10-CM | POA: Diagnosis not present

## 2014-12-27 DIAGNOSIS — R262 Difficulty in walking, not elsewhere classified: Secondary | ICD-10-CM | POA: Diagnosis not present

## 2014-12-27 DIAGNOSIS — M25651 Stiffness of right hip, not elsewhere classified: Secondary | ICD-10-CM | POA: Diagnosis not present

## 2015-01-01 DIAGNOSIS — R262 Difficulty in walking, not elsewhere classified: Secondary | ICD-10-CM | POA: Diagnosis not present

## 2015-01-01 DIAGNOSIS — M25551 Pain in right hip: Secondary | ICD-10-CM | POA: Diagnosis not present

## 2015-01-01 DIAGNOSIS — R293 Abnormal posture: Secondary | ICD-10-CM | POA: Diagnosis not present

## 2015-01-01 DIAGNOSIS — M25651 Stiffness of right hip, not elsewhere classified: Secondary | ICD-10-CM | POA: Diagnosis not present

## 2015-01-03 DIAGNOSIS — R293 Abnormal posture: Secondary | ICD-10-CM | POA: Diagnosis not present

## 2015-01-03 DIAGNOSIS — M25551 Pain in right hip: Secondary | ICD-10-CM | POA: Diagnosis not present

## 2015-01-03 DIAGNOSIS — M25651 Stiffness of right hip, not elsewhere classified: Secondary | ICD-10-CM | POA: Diagnosis not present

## 2015-01-03 DIAGNOSIS — R262 Difficulty in walking, not elsewhere classified: Secondary | ICD-10-CM | POA: Diagnosis not present

## 2015-01-08 DIAGNOSIS — M25651 Stiffness of right hip, not elsewhere classified: Secondary | ICD-10-CM | POA: Diagnosis not present

## 2015-01-08 DIAGNOSIS — R262 Difficulty in walking, not elsewhere classified: Secondary | ICD-10-CM | POA: Diagnosis not present

## 2015-01-08 DIAGNOSIS — M25551 Pain in right hip: Secondary | ICD-10-CM | POA: Diagnosis not present

## 2015-01-08 DIAGNOSIS — R293 Abnormal posture: Secondary | ICD-10-CM | POA: Diagnosis not present

## 2015-01-10 DIAGNOSIS — M25651 Stiffness of right hip, not elsewhere classified: Secondary | ICD-10-CM | POA: Diagnosis not present

## 2015-01-10 DIAGNOSIS — M25551 Pain in right hip: Secondary | ICD-10-CM | POA: Diagnosis not present

## 2015-01-10 DIAGNOSIS — R293 Abnormal posture: Secondary | ICD-10-CM | POA: Diagnosis not present

## 2015-01-10 DIAGNOSIS — R262 Difficulty in walking, not elsewhere classified: Secondary | ICD-10-CM | POA: Diagnosis not present

## 2015-01-15 DIAGNOSIS — M25651 Stiffness of right hip, not elsewhere classified: Secondary | ICD-10-CM | POA: Diagnosis not present

## 2015-01-15 DIAGNOSIS — M25551 Pain in right hip: Secondary | ICD-10-CM | POA: Diagnosis not present

## 2015-01-15 DIAGNOSIS — R262 Difficulty in walking, not elsewhere classified: Secondary | ICD-10-CM | POA: Diagnosis not present

## 2015-01-15 DIAGNOSIS — R293 Abnormal posture: Secondary | ICD-10-CM | POA: Diagnosis not present

## 2015-01-17 DIAGNOSIS — R293 Abnormal posture: Secondary | ICD-10-CM | POA: Diagnosis not present

## 2015-01-17 DIAGNOSIS — M25651 Stiffness of right hip, not elsewhere classified: Secondary | ICD-10-CM | POA: Diagnosis not present

## 2015-01-17 DIAGNOSIS — R262 Difficulty in walking, not elsewhere classified: Secondary | ICD-10-CM | POA: Diagnosis not present

## 2015-01-17 DIAGNOSIS — M25551 Pain in right hip: Secondary | ICD-10-CM | POA: Diagnosis not present

## 2015-01-22 DIAGNOSIS — M25551 Pain in right hip: Secondary | ICD-10-CM | POA: Diagnosis not present

## 2015-01-22 DIAGNOSIS — M25651 Stiffness of right hip, not elsewhere classified: Secondary | ICD-10-CM | POA: Diagnosis not present

## 2015-01-22 DIAGNOSIS — R262 Difficulty in walking, not elsewhere classified: Secondary | ICD-10-CM | POA: Diagnosis not present

## 2015-01-22 DIAGNOSIS — R293 Abnormal posture: Secondary | ICD-10-CM | POA: Diagnosis not present

## 2015-01-24 DIAGNOSIS — R262 Difficulty in walking, not elsewhere classified: Secondary | ICD-10-CM | POA: Diagnosis not present

## 2015-01-24 DIAGNOSIS — R293 Abnormal posture: Secondary | ICD-10-CM | POA: Diagnosis not present

## 2015-01-24 DIAGNOSIS — M25551 Pain in right hip: Secondary | ICD-10-CM | POA: Diagnosis not present

## 2015-01-24 DIAGNOSIS — M25651 Stiffness of right hip, not elsewhere classified: Secondary | ICD-10-CM | POA: Diagnosis not present

## 2015-01-29 ENCOUNTER — Other Ambulatory Visit: Payer: Self-pay | Admitting: *Deleted

## 2015-01-29 MED ORDER — DESMOPRESSIN ACETATE 0.1 MG PO TABS: 0.1000 mg | ORAL_TABLET | Freq: Two times a day (BID) | ORAL | Status: DC

## 2015-01-29 NOTE — Telephone Encounter (Signed)
Please advise if ok to refill. 

## 2015-01-31 DIAGNOSIS — K219 Gastro-esophageal reflux disease without esophagitis: Secondary | ICD-10-CM | POA: Diagnosis not present

## 2015-01-31 DIAGNOSIS — H903 Sensorineural hearing loss, bilateral: Secondary | ICD-10-CM | POA: Diagnosis not present

## 2015-01-31 DIAGNOSIS — J4530 Mild persistent asthma, uncomplicated: Secondary | ICD-10-CM | POA: Diagnosis not present

## 2015-01-31 DIAGNOSIS — F329 Major depressive disorder, single episode, unspecified: Secondary | ICD-10-CM | POA: Diagnosis not present

## 2015-02-04 ENCOUNTER — Other Ambulatory Visit: Payer: Self-pay | Admitting: *Deleted

## 2015-02-04 MED ORDER — DESMOPRESSIN ACETATE 0.1 MG PO TABS: ORAL_TABLET | ORAL | Status: DC

## 2015-02-04 NOTE — Telephone Encounter (Signed)
Pt needs rx refill to go to [facility]. Done.

## 2015-02-18 ENCOUNTER — Other Ambulatory Visit: Payer: Self-pay | Admitting: *Deleted

## 2015-02-18 MED ORDER — LEVOTHYROXINE SODIUM 150 MCG PO TABS: ORAL_TABLET | ORAL | Status: DC

## 2015-05-21 ENCOUNTER — Other Ambulatory Visit: Payer: Self-pay | Admitting: Internal Medicine

## 2015-07-21 ENCOUNTER — Other Ambulatory Visit: Payer: Self-pay | Admitting: Internal Medicine

## 2015-08-09 ENCOUNTER — Encounter: Payer: Self-pay | Admitting: Internal Medicine

## 2015-08-09 ENCOUNTER — Ambulatory Visit (INDEPENDENT_AMBULATORY_CARE_PROVIDER_SITE_OTHER): Payer: Medicare Other | Admitting: Internal Medicine

## 2015-08-09 ENCOUNTER — Other Ambulatory Visit (INDEPENDENT_AMBULATORY_CARE_PROVIDER_SITE_OTHER): Payer: Medicare Other | Admitting: *Deleted

## 2015-08-09 VITALS — BP 126/68 | HR 89 | Temp 98.7°F | Resp 12 | Wt 213.8 lb

## 2015-08-09 DIAGNOSIS — E230 Hypopituitarism: Secondary | ICD-10-CM

## 2015-08-09 DIAGNOSIS — Z23 Encounter for immunization: Secondary | ICD-10-CM

## 2015-08-09 DIAGNOSIS — M810 Age-related osteoporosis without current pathological fracture: Secondary | ICD-10-CM | POA: Diagnosis not present

## 2015-08-09 NOTE — Patient Instructions (Signed)
Please use Flonase before applying the testosterone patches. Let me know if you still have irritation at the patch site.   Please come back for labs in 2 months after restarting testosterone.  Please return in 1 year.

## 2015-08-09 NOTE — Progress Notes (Signed)
Patient ID: Lawrence Moore, male   DOB: 1951-08-21, 64 y.o.   MRN: [id number]   HPI  Lawrence Moore is a 64 y.o.-year-old male, initially referred by his PCP, Dr. Scarlette Ar, for management of hypopituitarism and osteoporosis. He has seen Dr. Tamala Julian ([place]) in the past. Last visit with me 1 year ago.  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 He was started on HRT in 1967, in [facility].  Hypothyroidism:  Levothyroxine 150 mcg, taken: - fasting, with water - he takes the LT4 by itself - 30-35 min before b'fast - he takes calcium and PPIs with b'fast! - no iron or MVI  Previous TFTs: Lab Results  Component Value Date   FREET4 1.38 06/28/2014   - no weight gain - no cold intolerance - + constipation, diarrhea - IBS  Adrenal insufficiency:  Prednisone 5 mg in am  He had L surgery 09/2014 >> no problems with anesthesia or recovery.  He was cortisone acetate for a long time >> developed OP. He does have osteoporosis and had fractures, last DEXA scan in 2006.Hydrocortisone gives him itching. No fatigue later in the day.  No weight gain. No diabetes, no HTN.  Hypogonadism:  He is on Androgel patches 5 g - insurance comp. did not approve gel - stopped 2 mo ago b/c irritiation at the site  No erections now, but was having bothersome erections especially at night. No available PSA, CBC or testosterone levels to review.  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  He drinks >1 gallon fluids a day.    Chemistry      Component Value Date/Time   NA 141 10/03/2014 1025   K 4.0 10/03/2014 1025   CL 103 10/03/2014 1025   CO2 23 10/03/2014 1025   BUN 18 10/03/2014 1025   CREATININE 1.14 10/03/2014 1025      Component Value Date/Time   CALCIUM 9.4 10/03/2014 1025     He is not on [place] supplement. He was on [facility] from 1968-1974.   Pt  mentions: - + weight loss - no fatigue - + constipation/+ diarrhea - IBS - no hair falling - + depression - Fluoxetine helps - + migraines - R - for last years - not worse - Imitrex helping.  OP: - last DEXA scan:  08/02/2014 Received records from Dr Tamala Julian: DEXA scan 01/07/2014: - spine: T-score -2.5 (+ 11.1% since 12/02/2011)  - LFN: T-score: -2.1 - improved since 2006 - he was on Fosamax: 1998/1999-2012 - now on drug holiday - he takes 2x calcium citrate 500 mg in am, vit D 2000 units daily  He also has a history of PVD, HL, GERD, depression.  ROS: Constitutional: + weight gain, + fatigue, no subjective hyperthermia/hypothermia Eyes: no blurry vision, no xerophthalmia ENT: no sore throat, no nodules palpated in throat, no dysphagia/odynophagia, no hoarseness, + decreased hearing Cardiovascular: no CP/SOB/palpitations/leg swelling Respiratory: no cough/+ SOB/+ wheezing Gastrointestinal: no N/V/+ D/+ C Musculoskeletal: no muscle/+ joint aches (back) Skin: no rashes Neurological: no tremors/numbness/tingling/dizziness, + HA Psychiatric: + depression/no anxiety  Past Medical History  Diagnosis Date  . Degenerative joint disease   . Hyperlipidemia   . Depression   . Hypopituitarism ([place]) 06/28/2014  . Complication of anesthesia     "woke from anesthia itching" lft knee replacment  . Asthma   . Tuberculosis  have had tb vaccince yrs ago  . Hypothyroidism   . GERD (gastroesophageal reflux disease)   . Headache     migraines  . Diabetes insipidus [facility])    Past Surgical History  Procedure Laterality Date  . Appendectomy    . Cholecystectomy  2002    Lap. cholecystectomy  . 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 of craniopharyngioma  . Knee arthroscopy  2006    Right Knee  . Spine surgery  1998  . Joint replacement  2009    Left Knee replacement  . Knee arthroscopy Left 12  . Abdominal aortagram N/A 08/31/2012    Procedure: ABDOMINAL AORTAGRAM;  Surgeon:  Rosetta Posner, MD;  Location: [facility] CATH LAB;  Service: Cardiovascular;  Laterality: N/A;  . Back surgery    . Lumbar laminectomy/decompression microdiscectomy N/A 10/12/2014    Procedure: Lumbar five-Sacral one microdiskectomy;  Surgeon: Ashok Pall, MD;  Location: [place];  Service: Neurosurgery;  Laterality: N/A;  Lumbar five-Sacral one microdiskectomy   History   Social History  . Marital Status: Single    Spouse Name: N/A    Number of Children: 0   Occupational History  .    Social History Main Topics  . Smoking status: Former Smoker    Types: Cigarettes    Quit date: 2006  . Smokeless tobacco: No  . Alcohol Use: 1 drink wine/beer  . Drug Use: No   Current Outpatient Prescriptions on File Prior to Visit  Medication Sig Dispense Refill  . acetaminophen-codeine (TYLENOL #4) 300-60 MG per tablet Take 1-2 tablets by mouth 2 (two) times daily as needed for moderate pain.    . celecoxib (CELEBREX) 200 MG capsule Take 400 mg by mouth every morning.     . cholecalciferol (VITAMIN D) 1000 UNITS tablet Take 1,000 Units by mouth daily.    Marland Kitchen desmopressin (DDAVP) 0.1 MG tablet Take 1 tablet (0.1 mcg) every morning and 1 1/2 tablet (0.15 mcg) every night 225 tablet 3  . FIBER PO Take 2 capsules by mouth 2 (two) times daily.    Marland Kitchen FLUoxetine (PROZAC) 40 MG capsule Take 40 mg by mouth daily.     . Fluticasone-Salmeterol (ADVAIR) 250-50 MCG/DOSE AEPB Inhale 1 puff into the lungs daily. Mid afternoon    . gabapentin (NEURONTIN) 300 MG capsule Take 600-900 mg by mouth 3 times/day as needed-between meals & bedtime (pain).     . Ipratropium-Albuterol (COMBIVENT RESPIMAT) 20-100 MCG/ACT AERS respimat Inhale 1 puff into the lungs every 6 (six) hours as needed for wheezing or shortness of breath.    . levothyroxine (SYNTHROID, LEVOTHROID) 150 MCG tablet Take 1 tablet by mouth once a day before breakfast 90 tablet 0  . pantoprazole (PROTONIX) 40 MG tablet Take 40 mg by mouth daily.    . pravastatin  (PRAVACHOL) 40 MG tablet Take 40 mg by mouth at bedtime.     . predniSONE (DELTASONE) 5 MG tablet Take 1 tablet (5 mg total) by mouth daily. 100 tablet 3  . QUEtiapine (SEROQUEL) 25 MG tablet Take 25-50 mg by mouth at bedtime. Based on anxiety level    . SUMAtriptan (IMITREX) 100 MG tablet Take 100 mg by mouth every 2 (two) hours as needed for migraine.     . diazepam (VALIUM) 5 MG tablet Take 1 tablet (5 mg total) by mouth every 6 (six) hours as needed for muscle spasms. (Patient not taking: Reported on 08/09/2015) 30 tablet 0  . testosterone (ANDROGEL) 50 MG/5GM (  1%) GEL Place 2.5 g onto the skin daily. (Patient not taking: Reported on 08/09/2015) 250 g 3  . Testosterone 2 MG/24HR PT24 1 patch every 24 hrs (Patient not taking: Reported on 08/09/2015) 60 patch 2   No current facility-administered medications on file prior to visit.   Allergies  Allergen Reactions  . Adhesive [Tape] Other (See Comments)    bandaids cause redness - please [place] paper tape  . Erythromycin Swelling  . Ibuprofen Other (See Comments)    abd pain, cramping  . Zocor [Simvastatin] Other (See Comments)    Liver enzyme elevation   Family History  Problem Relation Age of Onset  . Depression Mother   . Hyperlipidemia Mother   . Other Mother     VARICOSE VEINS  . Cancer Father     prostate cancer  . Deep vein thrombosis Father   . Depression Sister   . Hyperlipidemia Sister    PE: BP 126/68 mmHg  Pulse 89  Temp(Src) 98.7 F (37.1 C) (Oral)  Resp 12  Wt 213 lb 12.8 oz (96.979 kg)  SpO2 96% Body mass index is 36.68 kg/(m^2). Wt Readings from Last 3 Encounters:  08/09/15 213 lb 12.8 oz (96.979 kg)  10/12/14 218 lb 3.2 oz (98.975 kg)  10/03/14 218 lb 3.2 oz (98.975 kg)   Constitutional: overweight, in NAD Eyes: PERRLA, EOMI, no exophthalmos ENT: moist mucous membranes, no thyromegaly, no cervical lymphadenopathy Cardiovascular: RRR, No MRG Respiratory: CTA B Gastrointestinal: abdomen soft, NT, ND,  BS+ Musculoskeletal: no deformities, strength intact in all 4 Skin: moist, warm, no rashes Neurological: + tremor with outstretched hands, DTR normal in all 4  ASSESSMENT: 1. Hypopituitarism  2. Osteoporosis  PLAN:  1. Hypopituitarism A. Central hypothyroidism - she is not taking the Levothyroxine correctly: takes Ca and PPI with b'fast, 30 min after LT4 - We discussed correct intake of thyroid hormones, in am, separated by >30 min from breakfast, and >4h from PPI, calcium, iron, MVI. - will recheck fT4 and fT3 (we cannot use TSH) - since we will separate the LT4 from Ca and PPi >> recheck labs in 2 mo - continue LT4 150 mcg for now B. Central adrenal insufficiency - on replacement with Prednisone 5 mg daily. He appears properly replaced.  - he is aware of sick days rules:  If you cannot keep anything down, including your hydrocortisone medication, please go to the emergency room or your primary care physician office to get steroids injected in the muscle or vein.  If you have a fever (more than 100 Fahrenheit), please double the dose of your hydrocortisone for the duration of the fever.  Do not run out of your hydrocortisone medication. -has wallet card mentioning "hypopituitarism" C. Hypogonadotropic hypogonadism - was on Androgel 2.5 g daily, but stopped 2 mo as we had to switch to patches and they caused skin irritation -  I encouraged him to restart more to help with his osteoporosis (although it will not be a large improvement from such a low dose). I suggested to apply Flonase at the patch site first. If this does not relieve his irritation, I will need to formulate an appeal for his insurance company to allow [place] to switch back to testosterone gel. - check testosterone, PSA, CBC - 2 mo after he restarts Testosterone - needs yearly DREs from PCP D. ? [place] insufficiency - pt is likely [place] deficient since he already has 3 pituitary hh deficiencies - we discussed about this in the  past and decided to forego testing for now especially since the CV benefits of replacement are not clearly defined and the fact that is a daily injectable med.  E. DI - he is on replacement with ddAVP 1 tab in am and 1/2 tab at night >> will continue same doses - 1x nocturia; if he misses hs dose, he cannot sleep 2/2 increased urination, if he misses the am dose >> thirsty all day - will check his sodium at next lab draw. This was normal at last check in 09/2015  2. Osteoporosis - h/o fractures - BMD improved in last years - was on long-term tx with Fosamax, now on drug holiday - separated calcium in 2 doses - continue vit D replacement, with 2000 IU daily - will check vit D level in 2 mo - will plan to get a new DEXA scan in 07/2016  Pt given flu and PNA vaccines today.  Orders Placed This Encounter  Procedures  . Basic Metabolic Panel  . Testosterone, Free, Total, SHBG  . PSA, Medicare  . CBC  . Vit D  25 hydroxy (rtn osteoporosis monitoring)  . T4, free  . T3, free   - time spent with the patient: 40 min, of which >50% was spent in obtaining information about his condition, his symptoms, reviewing previous  evaluations and treatments, counseling him about his conditions (please see the discussed topics above), and developing a plan to further investigate and tx them.

## 2015-08-12 ENCOUNTER — Other Ambulatory Visit: Payer: Self-pay

## 2015-08-16 ENCOUNTER — Other Ambulatory Visit: Payer: Self-pay | Admitting: Internal Medicine

## 2015-08-16 MED ORDER — PREDNISONE 5 MG PO TABS: 5.0000 mg | ORAL_TABLET | Freq: Every day | ORAL | Status: DC

## 2015-08-28 DIAGNOSIS — M150 Primary generalized (osteo)arthritis: Secondary | ICD-10-CM | POA: Diagnosis not present

## 2015-08-28 DIAGNOSIS — M7070 Other bursitis of hip, unspecified hip: Secondary | ICD-10-CM | POA: Diagnosis not present

## 2015-09-16 DIAGNOSIS — M79632 Pain in left forearm: Secondary | ICD-10-CM | POA: Diagnosis not present

## 2015-11-06 ENCOUNTER — Other Ambulatory Visit: Payer: Self-pay | Admitting: Internal Medicine

## 2015-12-05 DIAGNOSIS — M5410 Radiculopathy, site unspecified: Secondary | ICD-10-CM | POA: Diagnosis not present

## 2015-12-05 DIAGNOSIS — R030 Elevated blood-pressure reading, without diagnosis of hypertension: Secondary | ICD-10-CM | POA: Diagnosis not present

## 2015-12-05 DIAGNOSIS — M25551 Pain in right hip: Secondary | ICD-10-CM | POA: Diagnosis not present

## 2015-12-12 DIAGNOSIS — M5126 Other intervertebral disc displacement, lumbar region: Secondary | ICD-10-CM | POA: Diagnosis not present

## 2015-12-12 DIAGNOSIS — M5410 Radiculopathy, site unspecified: Secondary | ICD-10-CM | POA: Diagnosis not present

## 2015-12-17 ENCOUNTER — Other Ambulatory Visit: Payer: Self-pay | Admitting: Internal Medicine

## 2015-12-23 ENCOUNTER — Other Ambulatory Visit: Payer: Self-pay | Admitting: Neurosurgery

## 2015-12-23 DIAGNOSIS — I10 Essential (primary) hypertension: Secondary | ICD-10-CM | POA: Diagnosis not present

## 2015-12-23 DIAGNOSIS — M5117 Intervertebral disc disorders with radiculopathy, lumbosacral region: Secondary | ICD-10-CM | POA: Diagnosis not present

## 2015-12-23 DIAGNOSIS — Z6838 Body mass index (BMI) 38.0-38.9, adult: Secondary | ICD-10-CM | POA: Diagnosis not present

## 2016-01-01 NOTE — Pre-Procedure Instructions (Signed)
    Lawrence Moore  01/01/2016      [facility] [address], [place] [address] [place] [address] Phone: [phone] Fax: [phone]  [place], [place] [address] [place] [address] [place] [address] Phone: [phone] Fax: [phone]    Your procedure is scheduled on .Friday, March 17  Report to [facility] Admitting at 12:00 noon  Call this number if you have problems the morning of surgery:  [phone]              Any questions call [phone] Monday-Friday 8am-4pm  Remember:  Do not eat food or drink liquids after midnight.  Take these medicines the morning of surgery with A SIP OF WATER: tylenol #4 if needed, prozac,DDAVP, advair-bring to hospital, gabapentin, combivent if needed-bring to hospital, levothyroxine,protonix, prednisone, imitrex,               Stop 1 week prior to surgery: aspirin,vitamins, herbal medicines, nsaids: advil, motrin, ibuprofen, aleve, BC'S, Goody;s     Do not wear jewelry, make-up or nail polish.  Do not wear lotions, powders, or perfumes.  You may wear deodorant.  Do not shave 48 hours prior to surgery.  Men may shave face and neck.  Do not bring valuables to the hospital.  [facility] is not responsible for any belongings or valuables.  Contacts, dentures or bridgework may not be worn into surgery.  Leave your suitcase in the car.  After surgery it may be brought to your room.  For patients admitted to the hospital, discharge time will be determined by your treatment team.  Patients discharged the day of surgery will not be allowed to drive home.    Special instructions: review all  handouts  Please read over the following fact sheets that you were given. Pain Booklet, Coughing and Deep Breathing, MRSA Information and Surgical Site Infection Prevention

## 2016-01-02 ENCOUNTER — Encounter (HOSPITAL_COMMUNITY): Payer: Self-pay

## 2016-01-02 ENCOUNTER — Encounter (HOSPITAL_COMMUNITY)
Admission: RE | Admit: 2016-01-02 | Discharge: 2016-01-02 | Disposition: A | Discharge status: Home / Self Care | Payer: Medicare Other | Source: Ambulatory Visit | Attending: Neurosurgery | Admitting: Neurosurgery

## 2016-01-02 DIAGNOSIS — Z01812 Encounter for preprocedural laboratory examination: Secondary | ICD-10-CM | POA: Diagnosis not present

## 2016-01-02 DIAGNOSIS — I10 Essential (primary) hypertension: Secondary | ICD-10-CM | POA: Insufficient documentation

## 2016-01-02 DIAGNOSIS — Z01818 Encounter for other preprocedural examination: Secondary | ICD-10-CM | POA: Insufficient documentation

## 2016-01-02 DIAGNOSIS — M5126 Other intervertebral disc displacement, lumbar region: Secondary | ICD-10-CM | POA: Insufficient documentation

## 2016-01-02 HISTORY — DX: Reserved for inherently not codable concepts without codable children: IMO0001

## 2016-01-02 LAB — BASIC METABOLIC PANEL
ANION GAP: 9 (ref 5–15)
BUN: 16 mg/dL (ref 6–20)
CHLORIDE: 108 mmol/L (ref 101–111)
CO2: 27 mmol/L (ref 22–32)
Calcium: 9.5 mg/dL (ref 8.9–10.3)
Creatinine, Ser: 1.15 mg/dL (ref 0.61–1.24)
GFR calc Af Amer: >60 mL/min (ref >60)
GLUCOSE: 118 mg/dL — AB (ref 65–99)
POTASSIUM: 3.6 mmol/L (ref 3.5–5.1)
Sodium: 144 mmol/L (ref 135–145)

## 2016-01-02 LAB — CBC
HEMATOCRIT: 41.9 % (ref 39.0–52.0)
HEMOGLOBIN: 14.5 g/dL (ref 13.0–17.0)
MCH: 31 pg (ref 26.0–34.0)
MCHC: 34.6 g/dL (ref 30.0–36.0)
MCV: 89.5 fL (ref 78.0–100.0)
PLATELETS: 170 10*3/uL (ref 150–400)
RBC: 4.68 MIL/uL (ref 4.22–5.81)
RDW: 12.8 % (ref 11.5–15.5)
WBC: 7.3 10*3/uL (ref 4.0–10.5)

## 2016-01-02 LAB — SURGICAL PCR SCREEN
MRSA, PCR: NEGATIVE
Staphylococcus aureus: NEGATIVE

## 2016-01-07 ENCOUNTER — Other Ambulatory Visit: Payer: Self-pay | Admitting: Internal Medicine

## 2016-01-09 MED ORDER — CEFAZOLIN SODIUM-DEXTROSE 2-3 GM-% IV SOLR
2.0000 g | INTRAVENOUS | Status: AC
  Administered 2016-01-10: 2 g via INTRAVENOUS
  Filled 2016-01-09: qty 50

## 2016-01-10 ENCOUNTER — Ambulatory Visit (HOSPITAL_COMMUNITY): Payer: Medicare Other | Admitting: Certified Registered"

## 2016-01-10 ENCOUNTER — Ambulatory Visit (HOSPITAL_COMMUNITY)
Admission: RE | Admit: 2016-01-10 | Discharge: 2016-01-11 | Disposition: A | Discharge status: Home / Self Care | Payer: Medicare Other | Source: Ambulatory Visit | Attending: Neurosurgery | Admitting: Neurosurgery

## 2016-01-10 ENCOUNTER — Ambulatory Visit (HOSPITAL_COMMUNITY): Payer: Medicare Other

## 2016-01-10 ENCOUNTER — Encounter (HOSPITAL_COMMUNITY)
Admission: RE | Disposition: A | Discharge status: Home / Self Care | Payer: Self-pay | Source: Ambulatory Visit | Attending: Neurosurgery

## 2016-01-10 ENCOUNTER — Encounter (HOSPITAL_COMMUNITY): Payer: Self-pay | Admitting: Certified Registered"

## 2016-01-10 DIAGNOSIS — K219 Gastro-esophageal reflux disease without esophagitis: Secondary | ICD-10-CM | POA: Diagnosis not present

## 2016-01-10 DIAGNOSIS — M5127 Other intervertebral disc displacement, lumbosacral region: Secondary | ICD-10-CM | POA: Insufficient documentation

## 2016-01-10 DIAGNOSIS — F329 Major depressive disorder, single episode, unspecified: Secondary | ICD-10-CM | POA: Diagnosis not present

## 2016-01-10 DIAGNOSIS — E785 Hyperlipidemia, unspecified: Secondary | ICD-10-CM | POA: Diagnosis not present

## 2016-01-10 DIAGNOSIS — Z791 Long term (current) use of non-steroidal anti-inflammatories (NSAID): Secondary | ICD-10-CM | POA: Insufficient documentation

## 2016-01-10 DIAGNOSIS — Z87891 Personal history of nicotine dependence: Secondary | ICD-10-CM | POA: Diagnosis not present

## 2016-01-10 DIAGNOSIS — R0602 Shortness of breath: Secondary | ICD-10-CM | POA: Diagnosis not present

## 2016-01-10 DIAGNOSIS — Z7951 Long term (current) use of inhaled steroids: Secondary | ICD-10-CM | POA: Insufficient documentation

## 2016-01-10 DIAGNOSIS — Z79899 Other long term (current) drug therapy: Secondary | ICD-10-CM | POA: Insufficient documentation

## 2016-01-10 DIAGNOSIS — J449 Chronic obstructive pulmonary disease, unspecified: Secondary | ICD-10-CM | POA: Diagnosis not present

## 2016-01-10 DIAGNOSIS — G43909 Migraine, unspecified, not intractable, without status migrainosus: Secondary | ICD-10-CM | POA: Insufficient documentation

## 2016-01-10 DIAGNOSIS — M1990 Unspecified osteoarthritis, unspecified site: Secondary | ICD-10-CM | POA: Insufficient documentation

## 2016-01-10 DIAGNOSIS — Z9889 Other specified postprocedural states: Secondary | ICD-10-CM | POA: Diagnosis not present

## 2016-01-10 DIAGNOSIS — Z7952 Long term (current) use of systemic steroids: Secondary | ICD-10-CM | POA: Insufficient documentation

## 2016-01-10 DIAGNOSIS — Z96652 Presence of left artificial knee joint: Secondary | ICD-10-CM | POA: Insufficient documentation

## 2016-01-10 DIAGNOSIS — E039 Hypothyroidism, unspecified: Secondary | ICD-10-CM | POA: Diagnosis not present

## 2016-01-10 DIAGNOSIS — M5126 Other intervertebral disc displacement, lumbar region: Secondary | ICD-10-CM | POA: Diagnosis not present

## 2016-01-10 DIAGNOSIS — M79661 Pain in right lower leg: Secondary | ICD-10-CM | POA: Diagnosis present

## 2016-01-10 DIAGNOSIS — M549 Dorsalgia, unspecified: Secondary | ICD-10-CM

## 2016-01-10 HISTORY — PX: LUMBAR LAMINECTOMY/DECOMPRESSION MICRODISCECTOMY: SHX5026

## 2016-01-10 LAB — GLUCOSE, CAPILLARY: GLUCOSE-CAPILLARY: 105 mg/dL — AB (ref 65–99)

## 2016-01-10 SURGERY — LUMBAR LAMINECTOMY/DECOMPRESSION MICRODISCECTOMY 1 LEVEL
Anesthesia: General | Site: Back | Laterality: Right

## 2016-01-10 MED ORDER — ROCURONIUM BROMIDE 100 MG/10ML IV SOLN
INTRAVENOUS | Status: DC | PRN
  Administered 2016-01-10: 50 mg via INTRAVENOUS

## 2016-01-10 MED ORDER — ONDANSETRON HCL 4 MG/2ML IJ SOLN: 4.0000 mg | INTRAMUSCULAR | Status: DC | PRN

## 2016-01-10 MED ORDER — LACTATED RINGERS IV SOLN
INTRAVENOUS | Status: DC
  Administered 2016-01-10 (×2): via INTRAVENOUS

## 2016-01-10 MED ORDER — PROPOFOL 10 MG/ML IV BOLUS
INTRAVENOUS | Status: AC
  Filled 2016-01-10: qty 20

## 2016-01-10 MED ORDER — MORPHINE SULFATE (PF) 2 MG/ML IV SOLN: 1.0000 mg | INTRAVENOUS | Status: DC | PRN

## 2016-01-10 MED ORDER — ONDANSETRON HCL 4 MG/2ML IJ SOLN
INTRAMUSCULAR | Status: DC | PRN
  Administered 2016-01-10: 4 mg via INTRAVENOUS

## 2016-01-10 MED ORDER — MAGNESIUM CITRATE PO SOLN: 1.0000 | Freq: Once | ORAL | Status: DC | PRN

## 2016-01-10 MED ORDER — MIDAZOLAM HCL 2 MG/2ML IJ SOLN
INTRAMUSCULAR | Status: AC
  Filled 2016-01-10: qty 2

## 2016-01-10 MED ORDER — ZOLPIDEM TARTRATE 5 MG PO TABS: 5.0000 mg | ORAL_TABLET | Freq: Every evening | ORAL | Status: DC | PRN

## 2016-01-10 MED ORDER — LIDOCAINE HCL (CARDIAC) 20 MG/ML IV SOLN
INTRAVENOUS | Status: AC
  Filled 2016-01-10: qty 5

## 2016-01-10 MED ORDER — POTASSIUM CHLORIDE IN NACL 20-0.9 MEQ/L-% IV SOLN
INTRAVENOUS | Status: DC
  Filled 2016-01-10 (×3): qty 1000

## 2016-01-10 MED ORDER — SODIUM CHLORIDE 0.9% FLUSH
3.0000 mL | Freq: Two times a day (BID) | INTRAVENOUS | Status: DC
  Administered 2016-01-10: 3 mL via INTRAVENOUS

## 2016-01-10 MED ORDER — SENNOSIDES-DOCUSATE SODIUM 8.6-50 MG PO TABS: 1.0000 | ORAL_TABLET | Freq: Every evening | ORAL | Status: DC | PRN

## 2016-01-10 MED ORDER — NEOSTIGMINE METHYLSULFATE 10 MG/10ML IV SOLN
INTRAVENOUS | Status: DC | PRN
  Administered 2016-01-10: 4 mg via INTRAVENOUS

## 2016-01-10 MED ORDER — ONDANSETRON HCL 4 MG/2ML IJ SOLN
INTRAMUSCULAR | Status: AC
  Filled 2016-01-10: qty 2

## 2016-01-10 MED ORDER — GLYCOPYRROLATE 0.2 MG/ML IJ SOLN
INTRAMUSCULAR | Status: AC
  Filled 2016-01-10: qty 2

## 2016-01-10 MED ORDER — BISACODYL 5 MG PO TBEC: 5.0000 mg | DELAYED_RELEASE_TABLET | Freq: Every day | ORAL | Status: DC | PRN

## 2016-01-10 MED ORDER — SUMATRIPTAN SUCCINATE 100 MG PO TABS: 100.0000 mg | ORAL_TABLET | ORAL | Status: DC | PRN

## 2016-01-10 MED ORDER — GABAPENTIN 300 MG PO CAPS: 600.0000 mg | ORAL_CAPSULE | Freq: Two times a day (BID) | ORAL | Status: DC | PRN

## 2016-01-10 MED ORDER — THROMBIN 5000 UNITS EX SOLR
CUTANEOUS | Status: DC | PRN
  Administered 2016-01-10 (×2): 5000 [IU] via TOPICAL

## 2016-01-10 MED ORDER — PROMETHAZINE HCL 25 MG/ML IJ SOLN: 6.2500 mg | INTRAMUSCULAR | Status: DC | PRN

## 2016-01-10 MED ORDER — LIDOCAINE-EPINEPHRINE 0.5 %-1:200000 IJ SOLN
INTRAMUSCULAR | Status: DC | PRN
  Administered 2016-01-10: 10 mL

## 2016-01-10 MED ORDER — EPHEDRINE SULFATE 50 MG/ML IJ SOLN
INTRAMUSCULAR | Status: DC | PRN
  Administered 2016-01-10: 10 mg via INTRAVENOUS

## 2016-01-10 MED ORDER — ACETAMINOPHEN-CODEINE #4 300-60 MG PO TABS: 1.0000 | ORAL_TABLET | Freq: Two times a day (BID) | ORAL | Status: DC | PRN

## 2016-01-10 MED ORDER — IPRATROPIUM-ALBUTEROL 20-100 MCG/ACT IN AERS: 1.0000 | INHALATION_SPRAY | Freq: Four times a day (QID) | RESPIRATORY_TRACT | Status: DC | PRN

## 2016-01-10 MED ORDER — PANTOPRAZOLE SODIUM 40 MG PO TBEC: 40.0000 mg | DELAYED_RELEASE_TABLET | Freq: Every day | ORAL | Status: DC

## 2016-01-10 MED ORDER — PHENOL 1.4 % MT LIQD: 1.0000 | OROMUCOSAL | Status: DC | PRN

## 2016-01-10 MED ORDER — FLUOXETINE HCL 20 MG PO CAPS: 40.0000 mg | ORAL_CAPSULE | Freq: Every day | ORAL | Status: DC

## 2016-01-10 MED ORDER — HEMOSTATIC AGENTS (NO CHARGE) OPTIME
TOPICAL | Status: DC | PRN
  Administered 2016-01-10: 1 via TOPICAL

## 2016-01-10 MED ORDER — DESMOPRESSIN ACETATE 0.1 MG PO TABS
0.1000 mg | ORAL_TABLET | Freq: Two times a day (BID) | ORAL | Status: DC
  Administered 2016-01-10: 0.1 mg via ORAL
  Filled 2016-01-10 (×3): qty 1

## 2016-01-10 MED ORDER — HYDROMORPHONE HCL 1 MG/ML IJ SOLN
INTRAMUSCULAR | Status: AC
  Filled 2016-01-10: qty 1

## 2016-01-10 MED ORDER — MENTHOL 3 MG MT LOZG: 1.0000 | LOZENGE | OROMUCOSAL | Status: DC | PRN

## 2016-01-10 MED ORDER — OXYCODONE-ACETAMINOPHEN 5-325 MG PO TABS: 1.0000 | ORAL_TABLET | Freq: Four times a day (QID) | ORAL | Status: DC | PRN

## 2016-01-10 MED ORDER — PREDNISONE 10 MG PO TABS: 5.0000 mg | ORAL_TABLET | Freq: Every day | ORAL | Status: DC

## 2016-01-10 MED ORDER — ROCURONIUM BROMIDE 50 MG/5ML IV SOLN
INTRAVENOUS | Status: AC
  Filled 2016-01-10: qty 1

## 2016-01-10 MED ORDER — OXYCODONE-ACETAMINOPHEN 5-325 MG PO TABS
1.0000 | ORAL_TABLET | ORAL | Status: DC | PRN
  Administered 2016-01-10 – 2016-01-11 (×3): 2 via ORAL
  Filled 2016-01-10 (×3): qty 2

## 2016-01-10 MED ORDER — FENTANYL CITRATE (PF) 250 MCG/5ML IJ SOLN
INTRAMUSCULAR | Status: DC | PRN
  Administered 2016-01-10: 250 ug via INTRAVENOUS

## 2016-01-10 MED ORDER — PRAVASTATIN SODIUM 40 MG PO TABS
40.0000 mg | ORAL_TABLET | Freq: Every day | ORAL | Status: DC
  Administered 2016-01-10: 40 mg via ORAL
  Filled 2016-01-10: qty 1

## 2016-01-10 MED ORDER — LEVOTHYROXINE SODIUM 100 MCG PO TABS
150.0000 ug | ORAL_TABLET | Freq: Every day | ORAL | Status: DC
  Administered 2016-01-11: 150 ug via ORAL
  Filled 2016-01-10: qty 2

## 2016-01-10 MED ORDER — LIDOCAINE HCL (CARDIAC) 20 MG/ML IV SOLN
INTRAVENOUS | Status: DC | PRN
  Administered 2016-01-10: 100 mg via INTRAVENOUS

## 2016-01-10 MED ORDER — PHENYLEPHRINE 40 MCG/ML (10ML) SYRINGE FOR IV PUSH (FOR BLOOD PRESSURE SUPPORT)
PREFILLED_SYRINGE | INTRAVENOUS | Status: AC
  Filled 2016-01-10: qty 10

## 2016-01-10 MED ORDER — SODIUM CHLORIDE 0.9% FLUSH: 3.0000 mL | INTRAVENOUS | Status: DC | PRN

## 2016-01-10 MED ORDER — GLYCOPYRROLATE 0.2 MG/ML IJ SOLN
INTRAMUSCULAR | Status: AC
  Filled 2016-01-10: qty 1

## 2016-01-10 MED ORDER — PROPOFOL 10 MG/ML IV BOLUS
INTRAVENOUS | Status: DC | PRN
  Administered 2016-01-10: 120 mg via INTRAVENOUS
  Administered 2016-01-10: 60 mg via INTRAVENOUS

## 2016-01-10 MED ORDER — ACETAMINOPHEN 325 MG PO TABS: 650.0000 mg | ORAL_TABLET | ORAL | Status: DC | PRN

## 2016-01-10 MED ORDER — 0.9 % SODIUM CHLORIDE (POUR BTL) OPTIME
TOPICAL | Status: DC | PRN
  Administered 2016-01-10: 1000 mL

## 2016-01-10 MED ORDER — GLYCOPYRROLATE 0.2 MG/ML IJ SOLN
INTRAMUSCULAR | Status: DC | PRN
  Administered 2016-01-10: 0.6 mg via INTRAVENOUS

## 2016-01-10 MED ORDER — ACETAMINOPHEN 650 MG RE SUPP: 650.0000 mg | RECTAL | Status: DC | PRN

## 2016-01-10 MED ORDER — MEPERIDINE HCL 25 MG/ML IJ SOLN: 6.2500 mg | INTRAMUSCULAR | Status: DC | PRN

## 2016-01-10 MED ORDER — HYDROMORPHONE HCL 1 MG/ML IJ SOLN
0.2500 mg | INTRAMUSCULAR | Status: DC | PRN
  Administered 2016-01-10 (×4): 0.5 mg via INTRAVENOUS

## 2016-01-10 MED ORDER — DEXMEDETOMIDINE HCL IN NACL 200 MCG/50ML IV SOLN
INTRAVENOUS | Status: AC
  Filled 2016-01-10: qty 50

## 2016-01-10 MED ORDER — SENNA 8.6 MG PO TABS
1.0000 | ORAL_TABLET | Freq: Two times a day (BID) | ORAL | Status: DC
Start: 2016-01-10 — End: 2016-01-11
  Administered 2016-01-10: 8.6 mg via ORAL
  Filled 2016-01-10: qty 1

## 2016-01-10 MED ORDER — CALCIUM CARBONATE ANTACID 750 MG PO CHEW: 1.0000 | CHEWABLE_TABLET | Freq: Two times a day (BID) | ORAL | Status: DC | PRN

## 2016-01-10 MED ORDER — DIPHENHYDRAMINE HCL 25 MG PO CAPS
50.0000 mg | ORAL_CAPSULE | ORAL | Status: DC | PRN
  Administered 2016-01-10: 50 mg via ORAL
  Filled 2016-01-10: qty 2

## 2016-01-10 MED ORDER — HYDROCORTISONE NA SUCCINATE PF 1000 MG IJ SOLR
INTRAMUSCULAR | Status: DC | PRN
  Administered 2016-01-10: 100 mg via INTRAVENOUS

## 2016-01-10 MED ORDER — NEOSTIGMINE METHYLSULFATE 10 MG/10ML IV SOLN
INTRAVENOUS | Status: AC
  Filled 2016-01-10: qty 1

## 2016-01-10 MED ORDER — FENTANYL CITRATE (PF) 250 MCG/5ML IJ SOLN
INTRAMUSCULAR | Status: AC
  Filled 2016-01-10: qty 5

## 2016-01-10 MED ORDER — MIDAZOLAM HCL 2 MG/2ML IJ SOLN: 0.5000 mg | Freq: Once | INTRAMUSCULAR | Status: DC | PRN

## 2016-01-10 MED ORDER — QUETIAPINE FUMARATE 25 MG PO TABS
25.0000 mg | ORAL_TABLET | Freq: Every day | ORAL | Status: DC
  Administered 2016-01-10: 25 mg via ORAL
  Filled 2016-01-10: qty 2

## 2016-01-10 MED ORDER — HYDROCORTISONE NA SUCCINATE PF 100 MG IJ SOLR
INTRAMUSCULAR | Status: AC
  Filled 2016-01-10: qty 2

## 2016-01-10 MED ORDER — VITAMIN D 1000 UNITS PO TABS: 1000.0000 [IU] | ORAL_TABLET | Freq: Every day | ORAL | Status: DC

## 2016-01-10 MED ORDER — CELECOXIB 200 MG PO CAPS: 400.0000 mg | ORAL_CAPSULE | Freq: Every day | ORAL | Status: DC

## 2016-01-10 MED ORDER — MOMETASONE FURO-FORMOTEROL FUM 200-5 MCG/ACT IN AERO
2.0000 | INHALATION_SPRAY | Freq: Two times a day (BID) | RESPIRATORY_TRACT | Status: DC
  Administered 2016-01-10: 2 via RESPIRATORY_TRACT
  Filled 2016-01-10 (×2): qty 8.8

## 2016-01-10 MED ORDER — PHENYLEPHRINE HCL 10 MG/ML IJ SOLN
INTRAMUSCULAR | Status: DC | PRN
  Administered 2016-01-10 (×2): 80 ug via INTRAVENOUS

## 2016-01-10 MED ORDER — MIDAZOLAM HCL 5 MG/5ML IJ SOLN
INTRAMUSCULAR | Status: DC | PRN
  Administered 2016-01-10: 2 mg via INTRAVENOUS

## 2016-01-10 MED ORDER — DIAZEPAM 5 MG PO TABS: 5.0000 mg | ORAL_TABLET | Freq: Four times a day (QID) | ORAL | Status: DC | PRN

## 2016-01-10 SURGICAL SUPPLY — 45 items
BAG DECANTER FOR FLEXI CONT (MISCELLANEOUS) ×2 IMPLANT
BENZOIN TINCTURE PRP APPL 2/3 (GAUZE/BANDAGES/DRESSINGS) IMPLANT
BLADE CLIPPER SURG (BLADE) IMPLANT
BUR MATCHSTICK NEURO 3.0 LAGG (BURR) ×2 IMPLANT
CANISTER SUCT 3000ML PPV (MISCELLANEOUS) ×2 IMPLANT
DECANTER SPIKE VIAL GLASS SM (MISCELLANEOUS) ×2 IMPLANT
DRAPE LAPAROTOMY 100X72X124 (DRAPES) ×2 IMPLANT
DRAPE MICROSCOPE LEICA (MISCELLANEOUS) ×2 IMPLANT
DRAPE POUCH INSTRU U-SHP 10X18 (DRAPES) ×2 IMPLANT
DRAPE SURG 17X23 STRL (DRAPES) ×2 IMPLANT
DRSG OPSITE POSTOP 4X6 (GAUZE/BANDAGES/DRESSINGS) ×2 IMPLANT
DURAPREP 26ML APPLICATOR (WOUND CARE) ×2 IMPLANT
ELECT REM PT RETURN 9FT ADLT (ELECTROSURGICAL) ×2
ELECTRODE REM PT RTRN 9FT ADLT (ELECTROSURGICAL) ×1 IMPLANT
GAUZE SPONGE 4X4 12PLY STRL (GAUZE/BANDAGES/DRESSINGS) IMPLANT
GAUZE SPONGE 4X4 16PLY XRAY LF (GAUZE/BANDAGES/DRESSINGS) IMPLANT
GLOVE ECLIPSE 6.5 STRL STRAW (GLOVE) ×2 IMPLANT
GLOVE EXAM NITRILE LRG STRL (GLOVE) IMPLANT
GLOVE EXAM NITRILE MD LF STRL (GLOVE) IMPLANT
GLOVE EXAM NITRILE XL STR (GLOVE) IMPLANT
GLOVE EXAM NITRILE XS STR PU (GLOVE) IMPLANT
GOWN STRL REUS W/ TWL LRG LVL3 (GOWN DISPOSABLE) ×2 IMPLANT
GOWN STRL REUS W/ TWL XL LVL3 (GOWN DISPOSABLE) IMPLANT
GOWN STRL REUS W/TWL 2XL LVL3 (GOWN DISPOSABLE) IMPLANT
GOWN STRL REUS W/TWL LRG LVL3 (GOWN DISPOSABLE) ×2
GOWN STRL REUS W/TWL XL LVL3 (GOWN DISPOSABLE)
KIT BASIN OR (CUSTOM PROCEDURE TRAY) ×2 IMPLANT
KIT ROOM TURNOVER OR (KITS) ×2 IMPLANT
LIQUID BAND (GAUZE/BANDAGES/DRESSINGS) ×2 IMPLANT
NEEDLE HYPO 25X1 1.5 SAFETY (NEEDLE) ×2 IMPLANT
NEEDLE SPNL 18GX3.5 QUINCKE PK (NEEDLE) IMPLANT
NS IRRIG 1000ML POUR BTL (IV SOLUTION) ×2 IMPLANT
PACK LAMINECTOMY NEURO (CUSTOM PROCEDURE TRAY) ×2 IMPLANT
PAD ARMBOARD 7.5X6 YLW CONV (MISCELLANEOUS) ×6 IMPLANT
RUBBERBAND STERILE (MISCELLANEOUS) ×4 IMPLANT
SPONGE LAP 4X18 X RAY DECT (DISPOSABLE) IMPLANT
SPONGE SURGIFOAM ABS GEL SZ50 (HEMOSTASIS) ×2 IMPLANT
STRIP CLOSURE SKIN 1/2X4 (GAUZE/BANDAGES/DRESSINGS) IMPLANT
SUT VIC AB 0 CT1 18XCR BRD8 (SUTURE) ×1 IMPLANT
SUT VIC AB 0 CT1 8-18 (SUTURE) ×1
SUT VIC AB 2-0 CT1 18 (SUTURE) ×2 IMPLANT
SUT VIC AB 3-0 SH 8-18 (SUTURE) ×2 IMPLANT
TOWEL OR 17X24 6PK STRL BLUE (TOWEL DISPOSABLE) ×2 IMPLANT
TOWEL OR 17X26 10 PK STRL BLUE (TOWEL DISPOSABLE) ×2 IMPLANT
WATER STERILE IRR 1000ML POUR (IV SOLUTION) ×2 IMPLANT

## 2016-01-10 NOTE — Op Note (Addendum)
01/10/2016  3:42 PM  PATIENT:  Lawrence Moore  66 y.o. male with pain in the right lower extremity secondary to a recurrent disc herniation at L5/S1 on the right side.   PRE-OPERATIVE DIAGNOSIS:   Right Lumbar herniated disc recurrent L5/S1  POST-OPERATIVE DIAGNOSIS:  Right Lumbar herniated disc recurrent L5/S1  PROCEDURE:  Procedure(s): right redo L5/S1 LUMBAR LAMINECTOMY/DECOMPRESSION MICRODISCECTOMY 1 LEVEL  SURGEON:   Surgeon(s): Ashok Pall, MD Newman Pies, MD  ASSISTANTS:Jenkins, Dellis Filbert  ANESTHESIA:   general  EBL:  Total I/O In: 1200 [I.V.:1200] Out: 50 [Blood:50]  BLOOD ADMINISTERED:none  CELL SAVER GIVEN:none  COUNT:per nursing  DRAINS: none   SPECIMEN:  No Specimen  DICTATION: Lawrence Moore was taken to the operating room, intubated and placed under a general anesthetic without difficulty. He was positioned prone on a Wilson frame with all pressure points padded. His back was prepped and draped in a sterile manner. I opened the skin with a 10 blade and carried the dissection down to the thoracolumbar fascia. I used both sharp dissection and the monopolar cautery to expose the lamina of 5 and S1. I confirmed my location with an intraoperative xray.  I used the drill, Kerrison punches, and curettes to perform a semihemilaminectomy of L5. I used the punches to remove the ligamentum flavum to expose the thecal sac. I brought the microscope into the operative field and with Dr.Jenkins' assistance we started our decompression of the spinal canal, thecal sac and S1 root(s). I cauterized epidural veins overlying the disc space then divided them sharply. I opened the disc space with a 15 blade and proceeded with the discectomy. I used pituitary rongeurs, curettes, and other instruments to remove disc material. After the discectomy was completed we inspected the S1 nerve root and felt it was well decompressed. I explored rostrally, laterally, medially, and caudally and was  satisfied with the decompression. I irrigated the wound, then closed in layers. I approximated the thoracolumbar fascia, subcutaneous, and subcuticular planes with vicryl sutures. I used dermabond for a sterile dressing.   PLAN OF CARE: Admit for overnight observation  PATIENT DISPOSITION:  PACU - hemodynamically stable.   Delay start of Pharmacological VTE agent (>24hrs) due to surgical blood loss or risk of bleeding:  yes

## 2016-01-10 NOTE — Discharge Summary (Signed)
Physician Discharge Summary  Patient ID: Lawrence Moore MRN: [id number] DOB/AGE: 04-02-1951 65 y.o.  Admit date: 01/10/2016 Discharge date: 01/10/2016  Admission Diagnoses:Recurrent right  L5/S1 disc herniation  Discharge Diagnoses: same Active Problems:   HNP (herniated nucleus pulposus), lumbar   Discharged Condition: good  Hospital Course: Lawrence Moore was admitted and taken to the operating room for an uncomplicated lumbar discetomy for a recurrent disc herniation. Post op he is ambulating, voiding, and tolerating a regular diet. He has excellent strength in the lower extremities. His wound is clean, dry, and without signs of infection.   Treatments: surgery: right redo L5/S1 LUMBAR LAMINECTOMY/DECOMPRESSION MICRODISCECTOMY 1 LEVEL   Discharge Exam: Blood pressure 166/92, pulse 101, temperature 98.2 F (36.8 C), temperature source Oral, resp. rate 20, weight 100.018 kg (220 lb 8 oz), SpO2 91 %. General appearance: alert, cooperative, appears stated age and no distress Neurologic: Grossly normal  Disposition: 01-Home or Self Care Lumbar herniated disc    Medication List    TAKE these medications        acetaminophen-codeine 300-60 MG tablet  Commonly known as:  TYLENOL #4  Take 1-2 tablets by mouth 2 (two) times daily as needed for moderate pain.     calcium carbonate 750 MG chewable tablet  Commonly known as:  TUMS EX  Chew 1 tablet by mouth 2 (two) times daily as needed for heartburn.     celecoxib 200 MG capsule  Commonly known as:  CELEBREX  Take 400 mg by mouth every morning.     cholecalciferol 1000 units tablet  Commonly known as:  VITAMIN D  Take 1,000 Units by mouth daily.     COMBIVENT RESPIMAT 20-100 MCG/ACT Aers respimat  Generic drug:  Ipratropium-Albuterol  Inhale 1 puff into the lungs every 6 (six) hours as needed for wheezing or shortness of breath.     desmopressin 0.1 MG tablet  Commonly known as:  DDAVP  Take 1 tablet by mouth  every  morning and 1 and 1/2 tablets by mouth at night     diazepam 5 MG tablet  Commonly known as:  VALIUM  Take 1 tablet (5 mg total) by mouth every 6 (six) hours as needed for muscle spasms.     FIBER PO  Take 2 capsules by mouth 2 (two) times daily.     FLUoxetine 40 MG capsule  Commonly known as:  PROZAC  Take 40 mg by mouth daily.     Fluticasone-Salmeterol 250-50 MCG/DOSE Aepb  Commonly known as:  ADVAIR  Inhale 1 puff into the lungs daily. Mid afternoon     gabapentin 300 MG capsule  Commonly known as:  NEURONTIN  Take 600 mg by mouth 3 times/day as needed-between meals & bedtime (pain).     levothyroxine 150 MCG tablet  Commonly known as:  SYNTHROID, LEVOTHROID  Take 1 tablet (150 mcg total) by mouth daily before breakfast. **LAST REFILL** NEEDS LABS**     oxyCODONE-acetaminophen 5-325 MG tablet  Commonly known as:  PERCOCET/ROXICET  Take 1 tablet by mouth every 6 (six) hours as needed for moderate pain.     pantoprazole 40 MG tablet  Commonly known as:  PROTONIX  Take 40 mg by mouth daily.     pravastatin 40 MG tablet  Commonly known as:  PRAVACHOL  Take 40 mg by mouth at bedtime.     predniSONE 5 MG tablet  Commonly known as:  DELTASONE  Take 1 tablet (5 mg total) by mouth daily.  QUEtiapine 25 MG tablet  Commonly known as:  SEROQUEL  Take 25-50 mg by mouth at bedtime. Based on anxiety level     SUMAtriptan 100 MG tablet  Commonly known as:  IMITREX  Take 100 mg by mouth every 2 (two) hours as needed for migraine.     testosterone 50 MG/5GM (1%) Gel  Commonly known as:  ANDROGEL  Place 2.5 g onto the skin daily.     Testosterone 2 MG/24HR Pt24  1 patch every 24 hrs           Follow-up Information    Follow up with Chryl Holten L, MD In 3 weeks.   Specialty:  Neurosurgery   Why:  call the office to make an appointment   Contact information:   [address] [place] [address] [phone]       Signed: Winfield Cunas 01/10/2016, 10:00 PM

## 2016-01-10 NOTE — H&P (Signed)
BP 168/97 mmHg  Pulse 80  Temp(Src) 98.4 F (36.9 C) (Oral)  Resp 18  Wt 100.018 kg (220 lb 8 oz)  SpO2 97%  Mr. Lawrence Moore returns today.  He has been experiencing severe pain in his right lower extremity.      DATA:                                                  He underwent an MRI with and without contrast, and it shows a small recurrence on the right side at L5-S1.  I do disagree with the Radiologist saying that there is this large occurrence because most of this appears to be scar tissue pushed out by the small recurrence.  Nevertheless, I think it is why he is hurting.  Allergies  Allergen Reactions  . Erythromycin Swelling  . Zocor [Simvastatin] Other (See Comments)    Liver enzyme elevation  . Hydrocodone Other (See Comments)    Unspecified  . Adhesive [Tape] Other (See Comments)    bandaids cause redness - please [place] paper tape  . Ibuprofen Other (See Comments)    abd pain, cramping   Past Surgical History  Procedure Laterality Date  . Appendectomy    . Cholecystectomy  2002    Lap. cholecystectomy  . 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 of craniopharyngioma  . Knee arthroscopy  2006    Right Knee  . Spine surgery  1998  . Joint replacement  2009    Left Knee replacement  . Knee arthroscopy Left 12  . Abdominal aortagram N/A 08/31/2012    Procedure: ABDOMINAL AORTAGRAM;  Surgeon: Rosetta Posner, MD;  Location: [facility] CATH LAB;  Service: Cardiovascular;  Laterality: N/A;  . Back surgery    . Lumbar laminectomy/decompression microdiscectomy N/A 10/12/2014    Procedure: Lumbar five-Sacral one microdiskectomy;  Surgeon: Ashok Pall, MD;  Location: [facility];  Service: Neurosurgery;  Laterality: N/A;  Lumbar five-Sacral one microdiskectomy   Past Medical History  Diagnosis Date  . Degenerative joint disease   . Hyperlipidemia   . Depression   . 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 Complication of anesthesia     "woke from anesthia itching" lft knee replacment  .  Asthma   . Tuberculosis     have had tb vaccince yrs ago  . Hypothyroidism   . GERD (gastroesophageal reflux disease)   . Headache     migraines  . Diabetes insipidus ([place])   . Shortness of breath dyspnea    Family History  Problem Relation Age of Onset  . Depression Mother   . Hyperlipidemia Mother   . Other Mother     VARICOSE VEINS  . Cancer Father     prostate cancer  . Deep vein thrombosis Father   . Depression Sister   . Hyperlipidemia Sister    Social History   Social History  . Marital Status: Single    Spouse Name: N/A  . Number of Children: N/A  . Years of Education: N/A   Occupational History  . Not on file.   Social History Main Topics  . Smoking status: Former Smoker -- 3.00 packs/day for 33 years    Types: Cigarettes    Quit date: 01/24/2005  . Smokeless tobacco: Never Used  . Alcohol Use: Yes     Comment: occ  .  Drug Use: No  . Sexual Activity: Not on file   Other Topics Concern  . Not on file   Social History Narrative   Prior to Admission medications   Medication Sig Start Date End Date Taking? Authorizing Provider  acetaminophen-codeine (TYLENOL #4) 300-60 MG per tablet Take 1-2 tablets by mouth 2 (two) times daily as needed for moderate pain.   Yes Historical Provider, MD  calcium carbonate (TUMS EX) 750 MG chewable tablet Chew 1 tablet by mouth 2 (two) times daily as needed for heartburn.   Yes Historical Provider, MD  celecoxib (CELEBREX) 200 MG capsule Take 400 mg by mouth every morning.    Yes Historical Provider, MD  cholecalciferol (VITAMIN D) 1000 UNITS tablet Take 1,000 Units by mouth daily.   Yes Historical Provider, MD  desmopressin (DDAVP) 0.1 MG tablet Take 1 tablet by mouth  every morning and 1 and 1/2 tablets by mouth at night 01/07/16  Yes Philemon Kingdom, MD  FIBER PO Take 2 capsules by mouth 2 (two) times daily.   Yes Historical Provider, MD  FLUoxetine (PROZAC) 40 MG capsule Take 40 mg by mouth daily.    Yes Historical  Provider, MD  Fluticasone-Salmeterol (ADVAIR) 250-50 MCG/DOSE AEPB Inhale 1 puff into the lungs daily. Mid afternoon   Yes Historical Provider, MD  gabapentin (NEURONTIN) 300 MG capsule Take 600 mg by mouth 3 times/day as needed-between meals & bedtime (pain).    Yes Historical Provider, MD  Ipratropium-Albuterol (COMBIVENT RESPIMAT) 20-100 MCG/ACT AERS respimat Inhale 1 puff into the lungs every 6 (six) hours as needed for wheezing or shortness of breath.   Yes Historical Provider, MD  levothyroxine (SYNTHROID, LEVOTHROID) 150 MCG tablet Take 1 tablet (150 mcg total) by mouth daily before breakfast. **LAST REFILL** NEEDS LABS** 12/17/15  Yes Philemon Kingdom, MD  pantoprazole (PROTONIX) 40 MG tablet Take 40 mg by mouth daily.   Yes Historical Provider, MD  pravastatin (PRAVACHOL) 40 MG tablet Take 40 mg by mouth at bedtime.    Yes Historical Provider, MD  predniSONE (DELTASONE) 5 MG tablet Take 1 tablet (5 mg total) by mouth daily. 08/16/15  Yes Philemon Kingdom, MD  QUEtiapine (SEROQUEL) 25 MG tablet Take 25-50 mg by mouth at bedtime. Based on anxiety level   Yes Historical Provider, MD  SUMAtriptan (IMITREX) 100 MG tablet Take 100 mg by mouth every 2 (two) hours as needed for migraine.    Yes Historical Provider, MD  diazepam (VALIUM) 5 MG tablet Take 1 tablet (5 mg total) by mouth every 6 (six) hours as needed for muscle spasms. Patient not taking: Reported on 08/09/2015 10/13/14   Kristeen Miss, MD  testosterone (ANDROGEL) 50 MG/5GM (1%) GEL Place 2.5 g onto the skin daily. Patient not taking: Reported on 08/09/2015 06/28/14   Philemon Kingdom, MD  Testosterone 2 MG/24HR PT24 1 patch every 24 hrs Patient not taking: Reported on 08/09/2015 11/06/14   Philemon Kingdom, MD           Physical Exam  Constitutional: He is oriented to person, place, and time. He appears well-nourished. No distress.  HENT:  Head: Normocephalic and atraumatic.  Right Ear: External ear normal.  Left Ear: External  ear normal.  Eyes: EOM are normal. Pupils are equal, round, and reactive to light.  Neck: Normal range of motion. Neck supple.  Cardiovascular: Normal rate, regular rhythm, normal heart sounds and intact distal pulses.   Pulmonary/Chest: Effort normal and breath sounds normal.  Abdominal: Soft. Bowel sounds are normal.  Neurological: He is alert and oriented to person, place, and time. He displays normal reflexes. No cranial nerve deficit. He exhibits normal muscle tone. Coordination normal.  Skin: Skin is warm and dry.  Psychiatric: He has a normal mood and affect. His behavior is normal. Judgment and thought content normal.  Vitals reviewed.   He still has an antalgic gait, favors the right lower extremity.  Memory, language, attention span, and fund of knowledge are normal.      IMPRESSION/PLAN:                             I will see him again on 01/03/2016.  We are planning to do a redo laminectomy on the right side of L5-S1.

## 2016-01-10 NOTE — Transfer of Care (Signed)
Immediate Anesthesia Transfer of Care Note  Patient: Lawrence Moore  Procedure(s) Performed: Procedure(s) with comments: LUMBAR LAMINECTOMY/DECOMPRESSION MICRODISCECTOMY 1 LEVEL (Right) - Right L5S1 microdiskectomy  Patient Location: PACU  Anesthesia Type:General  Level of Consciousness: awake, alert , oriented and patient cooperative  Airway & Oxygen Therapy: Patient Spontanous Breathing and Patient connected to nasal cannula oxygen  Post-op Assessment: Report given to RN, Post -op Vital signs reviewed and stable and Patient moving all extremities  Post vital signs: Reviewed and stable  Last Vitals:  Filed Vitals:   01/10/16 1211  BP: 168/97  Pulse: 80  Temp: 36.9 C  Resp: 18    Complications: No apparent anesthesia complications

## 2016-01-10 NOTE — Anesthesia Procedure Notes (Signed)
Procedure Name: Intubation Date/Time: 01/10/2016 2:04 PM Performed by: Myna Bright Pre-anesthesia Checklist: Patient identified, Emergency Drugs available, Suction available and Patient being monitored Patient Re-evaluated:Patient Re-evaluated prior to inductionOxygen Delivery Method: Circle system utilized Preoxygenation: Pre-oxygenation with 100% oxygen Intubation Type: IV induction Ventilation: Mask ventilation without difficulty and Oral airway inserted - appropriate to patient size Laryngoscope Size: Mac and 4 Grade View: Grade II Tube type: Oral Tube size: 7.5 mm Number of attempts: 1 Airway Equipment and Method: Stylet Placement Confirmation: ETT inserted through vocal cords under direct vision,  positive ETCO2 and breath sounds checked- equal and bilateral Secured at: 22 cm Tube secured with: Tape Dental Injury: Teeth and Oropharynx as per pre-operative assessment

## 2016-01-10 NOTE — Discharge Instructions (Signed)
Lumbar Laminectomy Care After A laminectomy is an operation performed on the spine. The purpose is to decompress the spinal cord and/or the nerve roots.  The time in surgery depends on the findings in surgery and what is necessary to correct the problems. HOME CARE INSTRUCTIONS   Check the cut (incision) made by the surgeon twice a day for signs of infection. Some signs of infection may include:   A foul smelling, greenish or yellowish discharge from the wound.   Increased pain.   Increased redness over the incision (operative) site.   The skin edges may separate.   Flu-like symptoms (problems).   A temperature above 101.5 F (38.6 C).   Change your bandages in about 24 to 36 hours following surgery or as directed.   You may shower tomorrow. Avoid bathtubs, swimming pools and hot tubs for three weeks or until your incision has healed completely. If you have stitches or staples, they may be removed 2 to 3 weeks after surgery, or as directed by your doctor. This may be done by your doctor or caregiver.   You may walk as much as you like. No need to exercise at this time. Limit lifting to ~10lbs.  Weight reduction may be beneficial if you are overweight.   Daily exercise is helpful to prevent the return of problems. Walking is permitted. You may use a treadmill without an incline. Cut down on activities and exercise if you have discomfort. You may also go up and down stairs as much as you can tolerate.   DO NOT lift anything heavier than 10 . Avoid bending or twisting at the waist. Always bend your knees when lifting.   Maintain strength and range of motion as instructed.   Do not drive for 2 to 3 weeks, or as directed by your doctors. You may be a passenger for 20 to 30 minute trips. Lying back in the passenger seat may be more comfortable for you. Always wear a seatbelt.   Limit your sitting in a regular chair to 20 to 30 minutes at a time. There are no limitations for sitting in a  recliner. You should lie down or walk in between sitting periods.   Only take over-the-counter or prescription medicines for pain, discomfort, or fever as directed by your caregiver.  SEEK MEDICAL CARE IF:   There is increased bleeding (more than a small spot) from the wound.   You notice redness, swelling, or increasing pain in the wound.   Pus is coming from wound.   You develop an unexplained oral temperature above 102 F (38.9 C) develops.   You notice a foul smell coming from the wound or dressing.   You have increasing pain in your wound.  SEEK IMMEDIATE MEDICAL CARE IF:   You develop a rash.   You have difficulty breathing.   You develop any allergic problems to medicines given.   

## 2016-01-10 NOTE — Progress Notes (Signed)
tranpsorted per [place], NT

## 2016-01-10 NOTE — Anesthesia Preprocedure Evaluation (Addendum)
Anesthesia Evaluation  Patient identified by MRN, date of birth, ID band Patient awake    Reviewed: Allergy & Precautions, NPO status , Patient's Chart, lab work & pertinent test results  History of Anesthesia Complications Negative for: history of anesthetic complications  Airway Mallampati: II  TM Distance: >3 FB Neck ROM: Full    Dental  (+) Missing, Poor Dentition, Dental Advisory Given   Pulmonary asthma , COPD,  COPD inhaler, former smoker (quit 2006),    breath sounds clear to auscultation       Cardiovascular negative cardio ROS   Rhythm:Regular Rate:Normal     Neuro/Psych  Headaches, Chronic back and leg pain H/o craniopharyngioma    GI/Hepatic Neg liver ROS, GERD  Medicated and Controlled,  Endo/Other  Hypothyroidism Morbid obesityHypopituitarism s/p craniopharyngioma resection: prednisone and DDAVP  Renal/GU negative Renal ROS     Musculoskeletal  (+) Arthritis , Osteoarthritis,    Abdominal (+) + obese,   Peds  Hematology negative hematology ROS (+)   Anesthesia Other Findings   Reproductive/Obstetrics                           Anesthesia Physical Anesthesia Plan  ASA: III  Anesthesia Plan: General   Post-op Pain Management:    Induction: Intravenous  Airway Management Planned: Oral ETT  Additional Equipment:   Intra-op Plan:   Post-operative Plan: Extubation in OR  Informed Consent: I have reviewed the patients History and Physical, chart, labs and discussed the procedure including the risks, benefits and alternatives for the proposed anesthesia with the patient or authorized representative who has indicated his/her understanding and acceptance.   Dental advisory given  Plan Discussed with: CRNA and Surgeon  Anesthesia Plan Comments: (Plan routine monitors, GETA)        Anesthesia Quick Evaluation

## 2016-01-10 NOTE — Anesthesia Postprocedure Evaluation (Signed)
Anesthesia Post Note  Patient: Lawrence Moore  Procedure(s) Performed: Procedure(s) (LRB): LUMBAR LAMINECTOMY/DECOMPRESSION MICRODISCECTOMY 1 LEVEL (Right)  Patient location during evaluation: PACU Anesthesia Type: General Level of consciousness: awake and alert, oriented and patient cooperative Pain management: pain level controlled Vital Signs Assessment: post-procedure vital signs reviewed and stable Respiratory status: spontaneous breathing, nonlabored ventilation, respiratory function stable and patient connected to nasal cannula oxygen Cardiovascular status: blood pressure returned to baseline and stable Postop Assessment: no signs of nausea or vomiting Anesthetic complications: no    Last Vitals:  Filed Vitals:   01/10/16 1541 01/10/16 1553  BP:  174/94  Pulse: 101 90  Temp: 37 C   Resp: 20 14    Last Pain:  Filed Vitals:   01/10/16 1611  PainSc: 10-Worst pain ever                 Shaida Route,E. Glena Pharris

## 2016-01-11 DIAGNOSIS — K219 Gastro-esophageal reflux disease without esophagitis: Secondary | ICD-10-CM | POA: Diagnosis not present

## 2016-01-11 DIAGNOSIS — Z96652 Presence of left artificial knee joint: Secondary | ICD-10-CM | POA: Diagnosis not present

## 2016-01-11 DIAGNOSIS — M5127 Other intervertebral disc displacement, lumbosacral region: Secondary | ICD-10-CM | POA: Diagnosis not present

## 2016-01-11 DIAGNOSIS — Z87891 Personal history of nicotine dependence: Secondary | ICD-10-CM | POA: Diagnosis not present

## 2016-01-11 DIAGNOSIS — F329 Major depressive disorder, single episode, unspecified: Secondary | ICD-10-CM | POA: Diagnosis not present

## 2016-01-11 DIAGNOSIS — G43909 Migraine, unspecified, not intractable, without status migrainosus: Secondary | ICD-10-CM | POA: Diagnosis not present

## 2016-01-11 NOTE — Progress Notes (Signed)
Patient alert and oriented, mae's well, voiding adequate amount of urine, swallowing without difficulty, no c/o pain. Patient discharged home with family. Script and discharged instructions given to patient. Patient and family stated understanding of d/c instructions given and has an appointment with MD. 

## 2016-01-13 ENCOUNTER — Encounter (HOSPITAL_COMMUNITY): Payer: Self-pay | Admitting: Neurosurgery

## 2016-02-18 ENCOUNTER — Other Ambulatory Visit: Payer: Self-pay | Admitting: *Deleted

## 2016-02-18 MED ORDER — LEVOTHYROXINE SODIUM 150 MCG PO TABS: 150.0000 ug | ORAL_TABLET | Freq: Every day | ORAL | Status: DC

## 2016-02-20 DIAGNOSIS — R293 Abnormal posture: Secondary | ICD-10-CM | POA: Diagnosis not present

## 2016-02-20 DIAGNOSIS — M5417 Radiculopathy, lumbosacral region: Secondary | ICD-10-CM | POA: Diagnosis not present

## 2016-02-20 DIAGNOSIS — M545 Low back pain: Secondary | ICD-10-CM | POA: Diagnosis not present

## 2016-02-20 DIAGNOSIS — M5416 Radiculopathy, lumbar region: Secondary | ICD-10-CM | POA: Diagnosis not present

## 2016-03-03 DIAGNOSIS — M5416 Radiculopathy, lumbar region: Secondary | ICD-10-CM | POA: Diagnosis not present

## 2016-03-03 DIAGNOSIS — M545 Low back pain: Secondary | ICD-10-CM | POA: Diagnosis not present

## 2016-03-03 DIAGNOSIS — M5417 Radiculopathy, lumbosacral region: Secondary | ICD-10-CM | POA: Diagnosis not present

## 2016-03-03 DIAGNOSIS — R293 Abnormal posture: Secondary | ICD-10-CM | POA: Diagnosis not present

## 2016-03-10 DIAGNOSIS — R293 Abnormal posture: Secondary | ICD-10-CM | POA: Diagnosis not present

## 2016-03-10 DIAGNOSIS — M5416 Radiculopathy, lumbar region: Secondary | ICD-10-CM | POA: Diagnosis not present

## 2016-03-10 DIAGNOSIS — M5417 Radiculopathy, lumbosacral region: Secondary | ICD-10-CM | POA: Diagnosis not present

## 2016-03-10 DIAGNOSIS — M545 Low back pain: Secondary | ICD-10-CM | POA: Diagnosis not present

## 2016-03-12 DIAGNOSIS — M5416 Radiculopathy, lumbar region: Secondary | ICD-10-CM | POA: Diagnosis not present

## 2016-03-12 DIAGNOSIS — M545 Low back pain: Secondary | ICD-10-CM | POA: Diagnosis not present

## 2016-03-12 DIAGNOSIS — R293 Abnormal posture: Secondary | ICD-10-CM | POA: Diagnosis not present

## 2016-03-12 DIAGNOSIS — M5417 Radiculopathy, lumbosacral region: Secondary | ICD-10-CM | POA: Diagnosis not present

## 2016-03-13 DIAGNOSIS — M5416 Radiculopathy, lumbar region: Secondary | ICD-10-CM | POA: Diagnosis not present

## 2016-03-13 DIAGNOSIS — M545 Low back pain: Secondary | ICD-10-CM | POA: Diagnosis not present

## 2016-03-13 DIAGNOSIS — M5417 Radiculopathy, lumbosacral region: Secondary | ICD-10-CM | POA: Diagnosis not present

## 2016-03-13 DIAGNOSIS — R293 Abnormal posture: Secondary | ICD-10-CM | POA: Diagnosis not present

## 2016-03-17 DIAGNOSIS — M545 Low back pain: Secondary | ICD-10-CM | POA: Diagnosis not present

## 2016-03-17 DIAGNOSIS — M5417 Radiculopathy, lumbosacral region: Secondary | ICD-10-CM | POA: Diagnosis not present

## 2016-03-17 DIAGNOSIS — M5416 Radiculopathy, lumbar region: Secondary | ICD-10-CM | POA: Diagnosis not present

## 2016-03-17 DIAGNOSIS — R293 Abnormal posture: Secondary | ICD-10-CM | POA: Diagnosis not present

## 2016-03-19 DIAGNOSIS — M5416 Radiculopathy, lumbar region: Secondary | ICD-10-CM | POA: Diagnosis not present

## 2016-03-19 DIAGNOSIS — R293 Abnormal posture: Secondary | ICD-10-CM | POA: Diagnosis not present

## 2016-03-19 DIAGNOSIS — M5417 Radiculopathy, lumbosacral region: Secondary | ICD-10-CM | POA: Diagnosis not present

## 2016-03-19 DIAGNOSIS — M545 Low back pain: Secondary | ICD-10-CM | POA: Diagnosis not present

## 2016-03-20 DIAGNOSIS — M545 Low back pain: Secondary | ICD-10-CM | POA: Diagnosis not present

## 2016-03-20 DIAGNOSIS — R293 Abnormal posture: Secondary | ICD-10-CM | POA: Diagnosis not present

## 2016-03-20 DIAGNOSIS — M5417 Radiculopathy, lumbosacral region: Secondary | ICD-10-CM | POA: Diagnosis not present

## 2016-03-20 DIAGNOSIS — M5416 Radiculopathy, lumbar region: Secondary | ICD-10-CM | POA: Diagnosis not present

## 2016-03-24 DIAGNOSIS — M545 Low back pain: Secondary | ICD-10-CM | POA: Diagnosis not present

## 2016-03-24 DIAGNOSIS — M5417 Radiculopathy, lumbosacral region: Secondary | ICD-10-CM | POA: Diagnosis not present

## 2016-03-24 DIAGNOSIS — R293 Abnormal posture: Secondary | ICD-10-CM | POA: Diagnosis not present

## 2016-03-24 DIAGNOSIS — M5416 Radiculopathy, lumbar region: Secondary | ICD-10-CM | POA: Diagnosis not present

## 2016-03-26 DIAGNOSIS — M545 Low back pain: Secondary | ICD-10-CM | POA: Diagnosis not present

## 2016-03-26 DIAGNOSIS — M5416 Radiculopathy, lumbar region: Secondary | ICD-10-CM | POA: Diagnosis not present

## 2016-03-26 DIAGNOSIS — M5417 Radiculopathy, lumbosacral region: Secondary | ICD-10-CM | POA: Diagnosis not present

## 2016-03-26 DIAGNOSIS — R293 Abnormal posture: Secondary | ICD-10-CM | POA: Diagnosis not present

## 2016-03-27 DIAGNOSIS — M5416 Radiculopathy, lumbar region: Secondary | ICD-10-CM | POA: Diagnosis not present

## 2016-03-27 DIAGNOSIS — M5417 Radiculopathy, lumbosacral region: Secondary | ICD-10-CM | POA: Diagnosis not present

## 2016-03-27 DIAGNOSIS — M545 Low back pain: Secondary | ICD-10-CM | POA: Diagnosis not present

## 2016-03-27 DIAGNOSIS — R293 Abnormal posture: Secondary | ICD-10-CM | POA: Diagnosis not present

## 2016-03-30 DIAGNOSIS — J4530 Mild persistent asthma, uncomplicated: Secondary | ICD-10-CM | POA: Diagnosis not present

## 2016-03-30 DIAGNOSIS — K219 Gastro-esophageal reflux disease without esophagitis: Secondary | ICD-10-CM | POA: Diagnosis not present

## 2016-03-30 DIAGNOSIS — E232 Diabetes insipidus: Secondary | ICD-10-CM | POA: Diagnosis not present

## 2016-03-30 DIAGNOSIS — K588 Other irritable bowel syndrome: Secondary | ICD-10-CM | POA: Diagnosis not present

## 2016-03-30 DIAGNOSIS — E2749 Other adrenocortical insufficiency: Secondary | ICD-10-CM | POA: Diagnosis not present

## 2016-03-30 DIAGNOSIS — E7800 Pure hypercholesterolemia, unspecified: Secondary | ICD-10-CM | POA: Diagnosis not present

## 2016-03-30 DIAGNOSIS — E230 Hypopituitarism: Secondary | ICD-10-CM | POA: Diagnosis not present

## 2016-03-30 DIAGNOSIS — F3289 Other specified depressive episodes: Secondary | ICD-10-CM | POA: Diagnosis not present

## 2016-03-30 DIAGNOSIS — E291 Testicular hypofunction: Secondary | ICD-10-CM | POA: Diagnosis not present

## 2016-03-31 DIAGNOSIS — M5416 Radiculopathy, lumbar region: Secondary | ICD-10-CM | POA: Diagnosis not present

## 2016-03-31 DIAGNOSIS — M545 Low back pain: Secondary | ICD-10-CM | POA: Diagnosis not present

## 2016-03-31 DIAGNOSIS — R293 Abnormal posture: Secondary | ICD-10-CM | POA: Diagnosis not present

## 2016-03-31 DIAGNOSIS — M5417 Radiculopathy, lumbosacral region: Secondary | ICD-10-CM | POA: Diagnosis not present

## 2016-04-02 DIAGNOSIS — M5417 Radiculopathy, lumbosacral region: Secondary | ICD-10-CM | POA: Diagnosis not present

## 2016-04-02 DIAGNOSIS — M545 Low back pain: Secondary | ICD-10-CM | POA: Diagnosis not present

## 2016-04-02 DIAGNOSIS — R293 Abnormal posture: Secondary | ICD-10-CM | POA: Diagnosis not present

## 2016-04-02 DIAGNOSIS — M5416 Radiculopathy, lumbar region: Secondary | ICD-10-CM | POA: Diagnosis not present

## 2016-04-03 DIAGNOSIS — R293 Abnormal posture: Secondary | ICD-10-CM | POA: Diagnosis not present

## 2016-04-03 DIAGNOSIS — M545 Low back pain: Secondary | ICD-10-CM | POA: Diagnosis not present

## 2016-04-03 DIAGNOSIS — M5416 Radiculopathy, lumbar region: Secondary | ICD-10-CM | POA: Diagnosis not present

## 2016-04-03 DIAGNOSIS — M5417 Radiculopathy, lumbosacral region: Secondary | ICD-10-CM | POA: Diagnosis not present

## 2016-04-07 DIAGNOSIS — M5416 Radiculopathy, lumbar region: Secondary | ICD-10-CM | POA: Diagnosis not present

## 2016-04-07 DIAGNOSIS — R293 Abnormal posture: Secondary | ICD-10-CM | POA: Diagnosis not present

## 2016-04-07 DIAGNOSIS — M5417 Radiculopathy, lumbosacral region: Secondary | ICD-10-CM | POA: Diagnosis not present

## 2016-04-07 DIAGNOSIS — M545 Low back pain: Secondary | ICD-10-CM | POA: Diagnosis not present

## 2016-04-09 DIAGNOSIS — M545 Low back pain: Secondary | ICD-10-CM | POA: Diagnosis not present

## 2016-04-09 DIAGNOSIS — R293 Abnormal posture: Secondary | ICD-10-CM | POA: Diagnosis not present

## 2016-04-09 DIAGNOSIS — M5416 Radiculopathy, lumbar region: Secondary | ICD-10-CM | POA: Diagnosis not present

## 2016-04-09 DIAGNOSIS — M5417 Radiculopathy, lumbosacral region: Secondary | ICD-10-CM | POA: Diagnosis not present

## 2016-04-10 ENCOUNTER — Encounter: Payer: Self-pay | Admitting: Gastroenterology

## 2016-04-15 ENCOUNTER — Ambulatory Visit (INDEPENDENT_AMBULATORY_CARE_PROVIDER_SITE_OTHER): Payer: Medicare Other

## 2016-04-15 ENCOUNTER — Ambulatory Visit (INDEPENDENT_AMBULATORY_CARE_PROVIDER_SITE_OTHER): Payer: Medicare Other | Admitting: Emergency Medicine

## 2016-04-15 VITALS — BP 140/82 | HR 90 | Temp 98.4°F | Resp 17 | Wt 215.0 lb

## 2016-04-15 DIAGNOSIS — R509 Fever, unspecified: Secondary | ICD-10-CM

## 2016-04-15 DIAGNOSIS — R05 Cough: Secondary | ICD-10-CM | POA: Diagnosis not present

## 2016-04-15 DIAGNOSIS — R059 Cough, unspecified: Secondary | ICD-10-CM

## 2016-04-15 LAB — POCT CBC
GRANULOCYTE PERCENT: 74.4 % (ref 37–80)
HCT, POC: 40.4 % — AB (ref 43.5–53.7)
Hemoglobin: 13.9 g/dL — AB (ref 14.1–18.1)
Lymph, poc: 1.7 (ref 0.6–3.4)
MCH: 30.7 pg (ref 27–31.2)
MCHC: 34.4 g/dL (ref 31.8–35.4)
MCV: 89.4 fL (ref 80–97)
MID (CBC): 0.7 (ref 0–0.9)
MPV: 8.1 fL (ref 0–99.8)
POC GRANULOCYTE: 6.9 (ref 2–6.9)
POC LYMPH PERCENT: 18.1 %L (ref 10–50)
POC MID %: 7.5 % (ref 0–12)
Platelet Count, POC: 159 10*3/uL (ref 142–424)
RBC: 4.51 M/uL — AB (ref 4.69–6.13)
RDW, POC: 13.5 %
WBC: 9.3 10*3/uL (ref 4.6–10.2)

## 2016-04-15 LAB — POCT INFLUENZA A/B
Influenza A, POC: NEGATIVE
Influenza B, POC: NEGATIVE

## 2016-04-15 LAB — POCT RAPID STREP A (OFFICE): RAPID STREP A SCREEN: NEGATIVE

## 2016-04-15 MED ORDER — DOXYCYCLINE HYCLATE 100 MG PO TABS: 100.0000 mg | ORAL_TABLET | Freq: Two times a day (BID) | ORAL | Status: DC

## 2016-04-15 MED ORDER — BENZONATATE 100 MG PO CAPS: 100.0000 mg | ORAL_CAPSULE | Freq: Three times a day (TID) | ORAL | Status: DC | PRN

## 2016-04-15 NOTE — Patient Instructions (Addendum)
   IF you received an x-ray today, you will receive an invoice from [facility]. Please contact [facility] at [phone] with questions or concerns regarding your invoice.   IF you received labwork today, you will receive an invoice from [facility]. Please contact [facility] at [phone] with questions or concerns regarding your invoice.   Our billing staff will not be able to assist you with questions regarding bills from these companies.  You will be contacted with the lab results as soon as they are available. The fastest way to get your results is to activate your My Chart account. Instructions are located on the last page of this paperwork. If you have not heard from us regarding the results in 2 weeks, please contact this office.    Acute Bronchitis Bronchitis is inflammation of the airways that extend from the windpipe into the lungs (bronchi). The inflammation often causes mucus to develop. This leads to a cough, which is the most common symptom of bronchitis.  In acute bronchitis, the condition usually develops suddenly and goes away over time, usually in a couple weeks. Smoking, allergies, and asthma can make bronchitis worse. Repeated episodes of bronchitis may cause further lung problems.  CAUSES Acute bronchitis is most often caused by the same virus that causes a cold. The virus can spread from person to person (contagious) through coughing, sneezing, and touching contaminated objects. SIGNS AND SYMPTOMS   Cough.   Fever.   Coughing up mucus.   Body aches.   Chest congestion.   Chills.   Shortness of breath.   Sore throat.  DIAGNOSIS  Acute bronchitis is usually diagnosed through a physical exam. Your health care provider will also ask you questions about your medical history. Tests, such as chest X-rays, are sometimes done to rule out other conditions.  TREATMENT  Acute bronchitis usually goes away in a  couple weeks. Oftentimes, no medical treatment is necessary. Medicines are sometimes given for relief of fever or cough. Antibiotic medicines are usually not needed but may be prescribed in certain situations. In some cases, an inhaler may be recommended to help reduce shortness of breath and control the cough. A cool mist vaporizer may also be used to help thin bronchial secretions and make it easier to clear the chest.  HOME CARE INSTRUCTIONS  Get plenty of rest.   Drink enough fluids to keep your urine clear or pale yellow (unless you have a medical condition that requires fluid restriction). Increasing fluids may help thin your respiratory secretions (sputum) and reduce chest congestion, and it will prevent dehydration.   Take medicines only as directed by your health care provider.  If you were prescribed an antibiotic medicine, finish it all even if you start to feel better.  Avoid smoking and secondhand smoke. Exposure to cigarette smoke or irritating chemicals will make bronchitis worse. If you are a smoker, consider using nicotine gum or skin patches to help control withdrawal symptoms. Quitting smoking will help your lungs heal faster.   Reduce the chances of another bout of acute bronchitis by washing your hands frequently, avoiding people with cold symptoms, and trying not to touch your hands to your mouth, nose, or eyes.   Keep all follow-up visits as directed by your health care provider.  SEEK MEDICAL CARE IF: Your symptoms do not improve after 1 week of treatment.  SEEK IMMEDIATE MEDICAL CARE IF:  You develop an increased fever or chills.   You have chest pain.     You have severe shortness of breath.  You have bloody sputum.   You develop dehydration.  You faint or repeatedly feel like you are going to pass out.  You develop repeated vomiting.  You develop a severe headache. MAKE SURE YOU:   Understand these instructions.  Will watch your  condition.  Will get help right away if you are not doing well or get worse.   This information is not intended to replace advice given to you by your health care provider. Make sure you discuss any questions you have with your health care provider.   Document Released: 11/19/2004 Document Revised: 11/02/2014 Document Reviewed: 04/04/2013 Elsevier Interactive Patient Education 2016 [employer] Inc.  

## 2016-04-15 NOTE — Progress Notes (Signed)
Patient ID: Lawrence Moore, male   DOB: [birth year], 65 y.o.   MRN: [id number]    By signing my name below, I, Essence Howell, attest that this documentation has been prepared under the direction and in the presence of Darlyne Russian, MD Electronically Signed: Ladene Artist, ED Scribe 04/15/2016 at 2:16 PM.  Chief Complaint:  Chief Complaint  Patient presents with  . Fever    COUGH X 2 DAYS    HPI: Lawrence Moore is a 65 y.o. male, with a h/o hypopituitarism and asthma, who reports to [facility] today complaining of intermittent fever with Tmax of 102 F for the past 3 days. Triage temperature of 98.4 F. Pt reports associated symptoms of diaphoresis, sore throat, productive cough with green sputum and generalized body aches. He has received a influenza vaccine in October 2016. Pt reports h/o PNA. 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 He is followed by here every 6 months.   Past Medical History  Diagnosis Date  . Degenerative joint disease   . Hyperlipidemia   . Depression   . Hypopituitarism ([place]) 06/28/2014  . Complication of anesthesia     "woke from anesthia itching" lft knee replacment  . Asthma   . Tuberculosis     have had tb vaccince yrs ago  . Hypothyroidism   . GERD (gastroesophageal reflux disease)   . Headache     migraines  . Diabetes insipidus ([place])   . Shortness of breath dyspnea    Past Surgical History  Procedure Laterality Date  . Appendectomy    . Cholecystectomy  2002    Lap. cholecystectomy  . 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 of craniopharyngioma  . Knee arthroscopy  2006    Right Knee  . Spine surgery  1998  . Joint replacement  2009    Left Knee replacement  . Knee arthroscopy Left 12  . Abdominal aortagram N/A 08/31/2012    Procedure: ABDOMINAL AORTAGRAM;  Surgeon: Rosetta Posner, MD;  Location: [facility] CATH LAB;  Service: Cardiovascular;   Laterality: N/A;  . Back surgery    . Lumbar laminectomy/decompression microdiscectomy N/A 10/12/2014    Procedure: Lumbar five-Sacral one microdiskectomy;  Surgeon: Ashok Pall, MD;  Location: [place];  Service: Neurosurgery;  Laterality: N/A;  Lumbar five-Sacral one microdiskectomy  . Lumbar laminectomy/decompression microdiscectomy Right 01/10/2016    Procedure: LUMBAR LAMINECTOMY/DECOMPRESSION MICRODISCECTOMY 1 LEVEL;  Surgeon: Ashok Pall, MD;  Location: [place] NEURO ORS;  Service: Neurosurgery;  Laterality: Right;  Right L5S1 microdiskectomy   Social History   Social History  . Marital Status: Single    Spouse Name: N/A  . Number of Children: N/A  . Years of Education: N/A   Social History Main Topics  . Smoking status: Former Smoker -- 3.00 packs/day for 33 years    Types: Cigarettes    Quit date: 01/24/2005  . Smokeless tobacco: Never Used  . Alcohol Use: Yes     Comment: occ  . Drug Use: No  . Sexual Activity: Not Asked   Other Topics Concern  . None   Social History Narrative   Family History  Problem Relation Age of Onset  . Depression Mother   . Hyperlipidemia Mother   . Other Mother     VARICOSE VEINS  . Cancer Father     prostate cancer  . Deep vein thrombosis Father   . Depression Sister   .  Hyperlipidemia Sister    Allergies  Allergen Reactions  . Erythromycin Swelling  . Zocor [Simvastatin] Other (See Comments)    Liver enzyme elevation  . Hydrocodone Other (See Comments)    Unspecified  . Adhesive [Tape] Other (See Comments)    bandaids cause redness - please [place] paper tape  . Ibuprofen Other (See Comments)    abd pain, cramping   Prior to Admission medications   Medication Sig Start Date End Date Taking? Authorizing Provider  aspirin 81 MG tablet Take 81 mg by mouth daily.   Yes Historical Provider, MD  calcium carbonate (TUMS EX) 750 MG chewable tablet Chew 1 tablet by mouth 2 (two) times daily as needed for heartburn.   Yes Historical Provider,  MD  celecoxib (CELEBREX) 200 MG capsule Take 400 mg by mouth every morning.    Yes Historical Provider, MD  cholecalciferol (VITAMIN D) 1000 UNITS tablet Take 1,000 Units by mouth daily.   Yes Historical Provider, MD  desmopressin (DDAVP) 0.1 MG tablet Take 1 tablet by mouth  every morning and 1 and 1/2 tablets by mouth at night 01/07/16  Yes Philemon Kingdom, MD  FIBER PO Take 2 capsules by mouth 2 (two) times daily.   Yes Historical Provider, MD  FLUoxetine (PROZAC) 40 MG capsule Take 40 mg by mouth daily.    Yes Historical Provider, MD  Fluticasone-Salmeterol (ADVAIR) 250-50 MCG/DOSE AEPB Inhale 1 puff into the lungs daily. Mid afternoon   Yes Historical Provider, MD  gabapentin (NEURONTIN) 300 MG capsule Take 600 mg by mouth 3 times/day as needed-between meals & bedtime (pain).    Yes Historical Provider, MD  Ipratropium-Albuterol (COMBIVENT RESPIMAT) 20-100 MCG/ACT AERS respimat Inhale 1 puff into the lungs every 6 (six) hours as needed for wheezing or shortness of breath.   Yes Historical Provider, MD  levothyroxine (SYNTHROID, LEVOTHROID) 150 MCG tablet Take 1 tablet (150 mcg total) by mouth daily before breakfast. **LAST REFILL** NEEDS LABS** 02/18/16  Yes Philemon Kingdom, MD  pantoprazole (PROTONIX) 40 MG tablet Take 40 mg by mouth daily.   Yes Historical Provider, MD  pravastatin (PRAVACHOL) 40 MG tablet Take 40 mg by mouth at bedtime.    Yes Historical Provider, MD  predniSONE (DELTASONE) 5 MG tablet Take 1 tablet (5 mg total) by mouth daily. 08/16/15  Yes Philemon Kingdom, MD  QUEtiapine (SEROQUEL) 25 MG tablet Take 25-50 mg by mouth at bedtime. Based on anxiety level   Yes Historical Provider, MD  SUMAtriptan (IMITREX) 100 MG tablet Take 100 mg by mouth every 2 (two) hours as needed for migraine.    Yes Historical Provider, MD  Testosterone 2 MG/24HR PT24 1 patch every 24 hrs 11/06/14  Yes Philemon Kingdom, MD   ROS: The patient denies unintentional weight loss, chest pain, palpitations,  wheezing, dyspnea on exertion, nausea, vomiting, abdominal pain, dysuria, hematuria, melena, numbness, weakness, or tingling.   All other systems have been reviewed and were otherwise negative with the exception of those mentioned in the HPI and as above.    PHYSICAL EXAM: Filed Vitals:   04/15/16 1122  BP: 140/82  Pulse: 90  Temp: 98.4 F (36.9 C)  Resp: 17   Body mass index is 36.89 kg/(m^2).  General: Alert, no acute distress HEENT:  Normocephalic, oropharynx patent. Large scar over th R eye and R temple area. TMs normal. Throat is red.  Eye: Juliette Mangle [facility] Cardiovascular: Regular rate and rhythm, no rubs murmurs or gallops. No Carotid bruits, radial pulse intact. No pedal edema.  Respiratory: Rhonchi bilaterally but no rales. Abdominal: No organomegaly, abdomen is soft and non-tender, positive bowel sounds. No masses. Musculoskeletal: Gait intact. No edema, tenderness Skin: No rashes. Neurologic: Facial musculature symmetric. Psychiatric: Patient acts appropriately throughout our interaction. Lymphatic: No cervical or submandibular lymphadenopathy  LABS: Results for orders placed or performed in visit on 04/15/16  POCT CBC  Result Value Ref Range   WBC 9.3 4.6 - 10.2 K/uL   Lymph, poc 1.7 0.6 - 3.4   POC LYMPH PERCENT 18.1 10 - 50 %L   MID (cbc) 0.7 0 - 0.9   POC MID % 7.5 0 - 12 %M   POC Granulocyte 6.9 2 - 6.9   Granulocyte percent 74.4 37 - 80 %G   RBC 4.51 (A) 4.69 - 6.13 M/uL   Hemoglobin 13.9 (A) 14.1 - 18.1 g/dL   HCT, POC 40.4 (A) 43.5 - 53.7 %   MCV 89.4 80 - 97 fL   MCH, POC 30.7 27 - 31.2 pg   MCHC 34.4 31.8 - 35.4 g/dL   RDW, POC 13.5 %   Platelet Count, POC 159 142 - 424 K/uL   MPV 8.1 0 - 99.8 fL  POCT Influenza A/B  Result Value Ref Range   Influenza A, POC Negative Negative   Influenza B, POC Negative Negative  POCT rapid strep A  Result Value Ref Range   Rapid Strep A Screen Negative Negative   EKG/XRAY:   Primary read interpreted by Dr.  Everlene Farrier at [facility]. Dg Chest 2 View  04/15/2016  CLINICAL DATA:  Intermittent fever and cough for 2 days. EXAM: CHEST  2 VIEW COMPARISON:  PA and lateral chest 11/28/2007. FINDINGS: The lungs are clear. Heart size is normal. No pneumothorax or pleural effusion. No focal bony abnormality. IMPRESSION: No acute disease. Electronically Signed   By: Inge Rise M.D.   On: 04/15/2016 14:11   ASSESSMENT/PLAN: We'll cover with doxycycline for bronchitis as well as Tessalon Perles for cough. He will increase his steroid dosage as he has been advised by his endocrinologist. Recheck in 48 hours if not improved.I personally performed the services described in this documentation, which was scribed in my presence. The recorded information has been reviewed and is accurate.   Gross sideeffects, risk and benefits, and alternatives of medications d/w patient. Patient is aware that all medications have potential sideeffects and we are unable to predict every sideeffect or drug-drug interaction that may occur.  Arlyss Queen MD 04/15/2016 1:17 PM

## 2016-04-17 LAB — CULTURE, GROUP A STREP: Organism ID, Bacteria: NORMAL

## 2016-04-27 DIAGNOSIS — R293 Abnormal posture: Secondary | ICD-10-CM | POA: Diagnosis not present

## 2016-04-27 DIAGNOSIS — M5417 Radiculopathy, lumbosacral region: Secondary | ICD-10-CM | POA: Diagnosis not present

## 2016-04-27 DIAGNOSIS — M545 Low back pain: Secondary | ICD-10-CM | POA: Diagnosis not present

## 2016-04-27 DIAGNOSIS — M5416 Radiculopathy, lumbar region: Secondary | ICD-10-CM | POA: Diagnosis not present

## 2016-04-30 DIAGNOSIS — M5416 Radiculopathy, lumbar region: Secondary | ICD-10-CM | POA: Diagnosis not present

## 2016-04-30 DIAGNOSIS — M545 Low back pain: Secondary | ICD-10-CM | POA: Diagnosis not present

## 2016-04-30 DIAGNOSIS — M5417 Radiculopathy, lumbosacral region: Secondary | ICD-10-CM | POA: Diagnosis not present

## 2016-04-30 DIAGNOSIS — R293 Abnormal posture: Secondary | ICD-10-CM | POA: Diagnosis not present

## 2016-05-01 DIAGNOSIS — R293 Abnormal posture: Secondary | ICD-10-CM | POA: Diagnosis not present

## 2016-05-01 DIAGNOSIS — M545 Low back pain: Secondary | ICD-10-CM | POA: Diagnosis not present

## 2016-05-01 DIAGNOSIS — M5416 Radiculopathy, lumbar region: Secondary | ICD-10-CM | POA: Diagnosis not present

## 2016-05-01 DIAGNOSIS — M5417 Radiculopathy, lumbosacral region: Secondary | ICD-10-CM | POA: Diagnosis not present

## 2016-05-06 ENCOUNTER — Emergency Department (HOSPITAL_COMMUNITY): Payer: Medicare Other

## 2016-05-06 ENCOUNTER — Emergency Department (HOSPITAL_COMMUNITY)
Admission: EM | Admit: 2016-05-06 | Discharge: 2016-05-06 | Disposition: A | Discharge status: Home / Self Care | Payer: Medicare Other | Attending: Emergency Medicine | Admitting: Emergency Medicine

## 2016-05-06 DIAGNOSIS — Z87891 Personal history of nicotine dependence: Secondary | ICD-10-CM | POA: Insufficient documentation

## 2016-05-06 DIAGNOSIS — J45909 Unspecified asthma, uncomplicated: Secondary | ICD-10-CM | POA: Insufficient documentation

## 2016-05-06 DIAGNOSIS — R42 Dizziness and giddiness: Secondary | ICD-10-CM | POA: Diagnosis not present

## 2016-05-06 DIAGNOSIS — R791 Abnormal coagulation profile: Secondary | ICD-10-CM | POA: Insufficient documentation

## 2016-05-06 DIAGNOSIS — I639 Cerebral infarction, unspecified: Secondary | ICD-10-CM

## 2016-05-06 DIAGNOSIS — Z96652 Presence of left artificial knee joint: Secondary | ICD-10-CM | POA: Diagnosis not present

## 2016-05-06 DIAGNOSIS — E232 Diabetes insipidus: Secondary | ICD-10-CM | POA: Diagnosis not present

## 2016-05-06 LAB — URINALYSIS, ROUTINE W REFLEX MICROSCOPIC
Bilirubin Urine: NEGATIVE
Glucose, UA: NEGATIVE mg/dL
Hgb urine dipstick: NEGATIVE
Ketones, ur: NEGATIVE mg/dL
LEUKOCYTES UA: NEGATIVE
NITRITE: NEGATIVE
PH: 6 (ref 5.0–8.0)
Protein, ur: NEGATIVE mg/dL
SPECIFIC GRAVITY, URINE: 1.011 (ref 1.005–1.030)

## 2016-05-06 LAB — DIFFERENTIAL
BASOS ABS: 0 10*3/uL (ref 0.0–0.1)
Basophils Relative: 1 %
EOS ABS: 0.1 10*3/uL (ref 0.0–0.7)
Eosinophils Relative: 2 %
LYMPHS ABS: 2.5 10*3/uL (ref 0.7–4.0)
Lymphocytes Relative: 45 %
Monocytes Absolute: 0.4 10*3/uL (ref 0.1–1.0)
Monocytes Relative: 7 %
NEUTROS PCT: 45 %
Neutro Abs: 2.5 10*3/uL (ref 1.7–7.7)

## 2016-05-06 LAB — COMPREHENSIVE METABOLIC PANEL
ALBUMIN: 3.8 g/dL (ref 3.5–5.0)
ALT: 39 U/L (ref 17–63)
AST: 31 U/L (ref 15–41)
Alkaline Phosphatase: 73 U/L (ref 38–126)
Anion gap: 8 (ref 5–15)
BILIRUBIN TOTAL: 1.1 mg/dL (ref 0.3–1.2)
BUN: 11 mg/dL (ref 6–20)
CHLORIDE: 105 mmol/L (ref 101–111)
CO2: 29 mmol/L (ref 22–32)
Calcium: 9.1 mg/dL (ref 8.9–10.3)
Creatinine, Ser: 0.99 mg/dL (ref 0.61–1.24)
GFR calc Af Amer: >60 mL/min (ref >60)
GFR calc non Af Amer: >60 mL/min (ref >60)
GLUCOSE: 93 mg/dL (ref 65–99)
POTASSIUM: 3.3 mmol/L — AB (ref 3.5–5.1)
Sodium: 142 mmol/L (ref 135–145)
TOTAL PROTEIN: 6.2 g/dL — AB (ref 6.5–8.1)

## 2016-05-06 LAB — I-STAT TROPONIN, ED: Troponin i, poc: 0 ng/mL (ref 0.00–0.08)

## 2016-05-06 LAB — CBC
HCT: 42.3 % (ref 39.0–52.0)
HEMOGLOBIN: 14.5 g/dL (ref 13.0–17.0)
MCH: 30.5 pg (ref 26.0–34.0)
MCHC: 34.3 g/dL (ref 30.0–36.0)
MCV: 89.1 fL (ref 78.0–100.0)
Platelets: 158 10*3/uL (ref 150–400)
RBC: 4.75 MIL/uL (ref 4.22–5.81)
RDW: 13.4 % (ref 11.5–15.5)
WBC: 5.5 10*3/uL (ref 4.0–10.5)

## 2016-05-06 LAB — PROTIME-INR
INR: 1.06 (ref 0.00–1.49)
Prothrombin Time: 14 seconds (ref 11.6–15.2)

## 2016-05-06 LAB — APTT: APTT: 29 s (ref 24–37)

## 2016-05-06 MED ORDER — CELECOXIB 400 MG PO CAPS
400.0000 mg | ORAL_CAPSULE | Freq: Once | ORAL | Status: AC
  Administered 2016-05-06: 400 mg via ORAL
  Filled 2016-05-06: qty 1

## 2016-05-06 MED ORDER — FLUOXETINE HCL 20 MG PO CAPS
40.0000 mg | ORAL_CAPSULE | Freq: Every day | ORAL | Status: DC
  Administered 2016-05-06: 40 mg via ORAL
  Filled 2016-05-06: qty 2

## 2016-05-06 MED ORDER — PREDNISONE 5 MG PO TABS
5.0000 mg | ORAL_TABLET | Freq: Once | ORAL | Status: AC
  Administered 2016-05-06: 5 mg via ORAL
  Filled 2016-05-06: qty 1

## 2016-05-06 MED ORDER — DESMOPRESSIN ACETATE 0.1 MG PO TABS
0.1000 mg | ORAL_TABLET | Freq: Once | ORAL | Status: AC
  Administered 2016-05-06: 0.1 mg via ORAL
  Filled 2016-05-06: qty 1

## 2016-05-06 MED ORDER — MECLIZINE HCL 25 MG PO TABS: 25.0000 mg | ORAL_TABLET | Freq: Three times a day (TID) | ORAL | Status: DC | PRN

## 2016-05-06 MED ORDER — MECLIZINE HCL 25 MG PO TABS
25.0000 mg | ORAL_TABLET | Freq: Once | ORAL | Status: AC
  Administered 2016-05-06: 25 mg via ORAL
  Filled 2016-05-06: qty 1

## 2016-05-06 MED ORDER — LEVOTHYROXINE SODIUM 150 MCG PO TABS
150.0000 ug | ORAL_TABLET | Freq: Every day | ORAL | Status: DC
  Administered 2016-05-06: 150 ug via ORAL
  Filled 2016-05-06: qty 1

## 2016-05-06 NOTE — ED Notes (Signed)
Called CT to inquire when pt will be taken for scan.

## 2016-05-06 NOTE — Discharge Instructions (Signed)
Lawrence Moore,  You were evaluated for your dizziness, and imaging did not show evidence of stroke. We have placed an outpatient neurology referral. If you do not get a call about an appointment within a week, please call either [facility] or [facility] at numbers provided in the follow-up section if you do not hear back.  Your heart enzyme looked normal, as did measures of your blood's ability to clot. Your electrolytes were normal except for potassium, which was a little low. Avocados, cantaloupe, dates, oranges, seet potatoes, potatoes, spinach, and bananas are good sources of potassium.   Meclizine was prescribed to take as needed for dizziness. Vestibular rehabilitation may be an option if these symptoms continue.  If you develop weakness or your dizziness does not respond to medication, please seek emergency evaluation.

## 2016-05-06 NOTE — ED Notes (Signed)
Patient here with dizziness episode that started on Sunday and lasted several hours with ambulation problems and room spinning. The symptoms resolved and then again this am when he awoke at 10 the room was spinning and almost fell out of bed. Speech clear, alert and oriented. States no nausea just feels fatigued

## 2016-05-06 NOTE — ED Notes (Signed)
Pt is in stable condition upon d/c and ambulates from ED. 

## 2016-05-06 NOTE — ED Provider Notes (Signed)
CSN: [id number]     Arrival date & time 05/06/16  1029 History   First MD Initiated Contact with Patient 05/06/16 1036     Chief Complaint  Patient presents with  . Dizziness   (Consider location/radiation/quality/duration/timing/severity/associated sxs/prior Treatment) HPI   Lawrence Moore is a 65-y/o male with remote history of craniotomy and hypopituitarism who presents with acute dizziness. He first had an episode 3 days ago (Sunday) that began when he was trying to have a bowel movement. He reports it felt like the room was spinning. He had nausea and dry heaving but no emesis. He stayed on the toilet for a while then rested in bed. Symptoms lasted about 4-5 hours at that time and resolved without intervention. Today, he sat up from bed and experienced dizziness again. Upon presentation, he described what he was seeing as "wavy." He had trouble walking due to dizziness but denies weakness. He denies slurring of speech. He denies that any particular position makes dizziness worse. He says he had an episode of dizziness in the 80s and never determined the cause. No medication changes.   Past Medical History  Diagnosis Date  . Degenerative joint disease   . Hyperlipidemia   . Depression   . 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 Complication of anesthesia     "woke from anesthia itching" lft knee replacment  . Asthma   . Tuberculosis     have had tb vaccince yrs ago  . Hypothyroidism   . GERD (gastroesophageal reflux disease)   . Headache     migraines  . Diabetes insipidus ([place])   . Shortness of breath dyspnea    Past Surgical History  Procedure Laterality Date  . Appendectomy    . Cholecystectomy  2002    Lap. cholecystectomy  . 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 of craniopharyngioma  . Knee arthroscopy  2006    Right Knee  . Spine surgery  1998  . Joint replacement  2009    Left Knee replacement  . Knee arthroscopy Left 12  . Abdominal aortagram N/A 08/31/2012     Procedure: ABDOMINAL AORTAGRAM;  Surgeon: Rosetta Posner, MD;  Location: [facility] CATH LAB;  Service: Cardiovascular;  Laterality: N/A;  . Back surgery    . Lumbar laminectomy/decompression microdiscectomy N/A 10/12/2014    Procedure: Lumbar five-Sacral one microdiskectomy;  Surgeon: Ashok Pall, MD;  Location: [place];  Service: Neurosurgery;  Laterality: N/A;  Lumbar five-Sacral one microdiskectomy  . Lumbar laminectomy/decompression microdiscectomy Right 01/10/2016    Procedure: LUMBAR LAMINECTOMY/DECOMPRESSION MICRODISCECTOMY 1 LEVEL;  Surgeon: Ashok Pall, MD;  Location: [place] [facility];  Service: Neurosurgery;  Laterality: Right;  Right L5S1 microdiskectomy   Family History  Problem Relation Age of Onset  . Depression Mother   . Hyperlipidemia Mother   . Other Mother     VARICOSE VEINS  . Cancer Father     prostate cancer  . Deep vein thrombosis Father   . Depression Sister   . Hyperlipidemia Sister    Social History  Substance Use Topics  . Smoking status: Former Smoker -- 3.00 packs/day for 33 years    Types: Cigarettes    Quit date: 01/24/2005  . Smokeless tobacco: Never Used  . Alcohol Use: Yes     Comment: occ    Review of Systems  Constitutional: Negative for fever and chills.  HENT: Negative for trouble swallowing.   Eyes: Positive for visual disturbance. Negative for photophobia and pain.  Respiratory: Negative for  cough and shortness of breath.   Cardiovascular: Negative for chest pain.  Gastrointestinal: Positive for diarrhea (chronic, has IBS). Negative for vomiting and abdominal pain.  Genitourinary: Negative for dysuria.  Musculoskeletal: Positive for neck pain (chronic). Negative for myalgias.  Skin: Negative for rash.  Neurological: Positive for dizziness. Negative for weakness.  Psychiatric/Behavioral: Negative for confusion.    Allergies  Erythromycin; Zocor; Hydrocodone; Adhesive; and Ibuprofen  Home Medications   Prior to Admission medications    Medication Sig Start Date End Date Taking? Authorizing Provider  calcium carbonate (TUMS EX) 750 MG chewable tablet Chew 1 tablet by mouth 2 (two) times daily as needed for heartburn.   Yes Historical Provider, MD  celecoxib (CELEBREX) 200 MG capsule Take 400 mg by mouth every morning.    Yes Historical Provider, MD  cholecalciferol (VITAMIN D) 1000 UNITS tablet Take 1,000 Units by mouth daily.   Yes Historical Provider, MD  desmopressin (DDAVP) 0.1 MG tablet Take 1 tablet by mouth  every morning and 1 and 1/2 tablets by mouth at night 01/07/16  Yes Philemon Kingdom, MD  FIBER PO Take 2 capsules by mouth 2 (two) times daily.   Yes Historical Provider, MD  FLUoxetine (PROZAC) 40 MG capsule Take 40 mg by mouth daily.    Yes Historical Provider, MD  Fluticasone-Salmeterol (ADVAIR) 250-50 MCG/DOSE AEPB Inhale 1 puff into the lungs daily. Mid afternoon   Yes Historical Provider, MD  gabapentin (NEURONTIN) 300 MG capsule Take 600 mg by mouth 3 times/day as needed-between meals & bedtime (pain).    Yes Historical Provider, MD  Ipratropium-Albuterol (COMBIVENT RESPIMAT) 20-100 MCG/ACT AERS respimat Inhale 1 puff into the lungs every 6 (six) hours as needed for wheezing or shortness of breath.   Yes Historical Provider, MD  levothyroxine (SYNTHROID, LEVOTHROID) 150 MCG tablet Take 1 tablet (150 mcg total) by mouth daily before breakfast. **LAST REFILL** NEEDS LABS** 02/18/16  Yes Philemon Kingdom, MD  pantoprazole (PROTONIX) 40 MG tablet Take 40 mg by mouth daily.   Yes Historical Provider, MD  pravastatin (PRAVACHOL) 40 MG tablet Take 40 mg by mouth at bedtime.    Yes Historical Provider, MD  predniSONE (DELTASONE) 5 MG tablet Take 1 tablet (5 mg total) by mouth daily. 08/16/15  Yes Philemon Kingdom, MD  QUEtiapine (SEROQUEL) 25 MG tablet Take 25-50 mg by mouth at bedtime. Based on anxiety level   Yes Historical Provider, MD  SUMAtriptan (IMITREX) 100 MG tablet Take 100 mg by mouth every 2 (two) hours as  needed for migraine.    Yes Historical Provider, MD  doxycycline (VIBRA-TABS) 100 MG tablet Take 1 tablet (100 mg total) by mouth 2 (two) times daily. Patient not taking: Reported on 05/06/2016 04/15/16   Darlyne Russian, MD  meclizine (ANTIVERT) 25 MG tablet Take 1 tablet (25 mg total) by mouth 3 (three) times daily as needed. 05/06/16   Hillary Corinda Gubler, MD  Testosterone 2 MG/24HR PT24 1 patch every 24 hrs Patient not taking: Reported on 05/06/2016 11/06/14   Philemon Kingdom, MD   BP 150/83 mmHg  Pulse 85  Temp(Src) 97.7 F (36.5 C) (Oral)  Resp 18  Ht 5\' 4"  (1.626 m)  Wt 97.07 kg  BMI 36.72 kg/m2  SpO2 96% Physical Exam  Constitutional: He is oriented to person, place, and time. He appears well-developed and well-nourished. No distress.  HENT:  Nose: Nose normal.  Mouth/Throat: Oropharynx is clear and moist.  Vertical frontal craniotomy scar. Dix-Hallpike maneuver negative.   Eyes: Conjunctivae and  EOM are normal. Pupils are equal, round, and reactive to light.  No vertical or horizontal nystagmus.   Neck: Normal range of motion. Neck supple.  Cardiovascular: Normal rate, regular rhythm, normal heart sounds and intact distal pulses.   No murmur heard. Pulmonary/Chest: Effort normal and breath sounds normal. No respiratory distress. He has no wheezes.  Abdominal: Soft. Bowel sounds are normal. There is no tenderness. There is no rebound and no guarding.  Neurological: He is alert and oriented to person, place, and time. Coordination normal.  Decreased L facial sensation of V1 and V2 (touch feels lighter). Decreased sensation over dorsum of L foot. Sensation otherwise intact and equal bilaterally. 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  Skin: Skin is warm and dry. He is not diaphoretic.  Nursing note and vitals reviewed.   ED Course  Procedures (including critical care time) Labs Review Labs Reviewed  COMPREHENSIVE METABOLIC PANEL - Abnormal;  Notable for the following:    Potassium 3.3 (*)    Total Protein 6.2 (*)    All other components within normal limits  PROTIME-INR  APTT  CBC  DIFFERENTIAL  URINALYSIS, ROUTINE W REFLEX MICROSCOPIC (NOT AT [facility])  Randolm Idol, ED    Imaging Review Ct Head Wo Contrast  05/06/2016  CLINICAL DATA:  65 year old male with vertigo for 3 days. History of craniotomy for craniopharyngioma in 1963. EXAM: CT HEAD WITHOUT CONTRAST TECHNIQUE: Contiguous axial images were obtained from the base of the skull through the vertex without intravenous contrast. COMPARISON:  None. FINDINGS: Right frontal craniotomy and surgical changes noted with right frontal encephalomalacia. Mild cerebral atrophy is identified. No acute intracranial abnormalities are identified, including mass lesion or mass effect, hydrocephalus, extra-axial fluid collection, midline shift, hemorrhage, or acute infarction. The visualized bony calvarium is unremarkable. IMPRESSION: No evidence of acute intracranial abnormality. Right postsurgical changes with right frontal encephalomalacia. Electronically Signed   By: Margarette Canada M.D.   On: 05/06/2016 12:23   Mr Brain Wo Contrast  05/06/2016  CLINICAL DATA:  Go 65 year old male with episodes of dizziness starting from Sunday lasting several hours with ambulation problems and room spinning. History of craniotomy for craniopharyngioma. EXAM: MRI HEAD WITHOUT CONTRAST TECHNIQUE: Multiplanar, multiecho pulse sequences of the brain and surrounding structures were obtained without intravenous contrast. COMPARISON:  CT of the head dated 05/06/2016. FINDINGS: Brain: No diffusion restriction to suggest acute infarct. Punctate focus of susceptibility hypointensity in the right superolateral frontal lobe compatible with old microhemorrhage. Right frontal encephalomalacia with marginal gliosis compatible with postsurgical changes subjacent to the right frontal craniotomy. A few additional scattered  nonspecific foci of T2 FLAIR hyperintensity in subcortical white matter are compatible with minimal chronic microvascular ischemic changes. No focal mass effect. Extra-axial space: Ex vacuo dilatation of the frontal horn of right lateral ventricle. On the hydrocephalus. No extra-axial collection is identified. No effacement of basilar cisterns. CSF signal within the sella turcica without appreciable soft tissue components.Proximal intracranial flow voids are maintained. No abnormality of the cervical medullary junction. Other: No abnormal signal of the paranasal sinuses. No abnormal signal of the mastoid air cells. Inner ear structures are grossly unremarkable. Orbits are unremarkable. Postsurgical changes related to right frontal craniotomy. IMPRESSION: 1. No evidence of acute infarct, intracranial hemorrhage, mass effect, or hydrocephalus. 2. Postsurgical changes including right frontal craniotomy and right frontal lobe encephalomalacia. Electronically Signed   By: Kristine Garbe M.D.   On: 05/06/2016 15:17   I have personally reviewed and evaluated  these images and lab results as part of my medical decision-making.   EKG Interpretation   Date/Time:  Wednesday May 06 2016 10:36:19 EDT Ventricular Rate:  73 PR Interval:  154 QRS Duration: 82 QT Interval:  402 QTC Calculation: 442 R Axis:   38 Text Interpretation:  Sinus rhythm Artifact Abnormal ekg Confirmed by  Carmin Muskrat  MD ([id number]) on 05/06/2016 11:29:35 AM      MDM   Final diagnoses:  Dizziness   Pt presents with acute onset of dizziness. Improved with meclizine. CT head and MRI brain negative for signs of acute infarct or hemorrhage. Focal deficits limited to somewhat sensation over L face and L dorsum of foot but not numb. EKG similar to previous tracings. Troponin negative. PT/INR, aPTT WNL. Electrolytes normal except for K of 3.3. UA negative. Pt discharged with outpatient referral to Neurology and prescription for  meclizine. Strict return precautions provided.  Olene Floss, MD [facility], PGY-2    [facility], MD 05/06/16 1958  Carmin Muskrat, MD 05/07/16 1600

## 2016-05-06 NOTE — ED Notes (Signed)
Patient transported to CT 

## 2016-05-06 NOTE — ED Notes (Signed)
Lunch ordered for pt

## 2016-05-06 NOTE — ED Notes (Signed)
Patient transported to MRI 

## 2016-05-08 DIAGNOSIS — R293 Abnormal posture: Secondary | ICD-10-CM | POA: Diagnosis not present

## 2016-05-08 DIAGNOSIS — M545 Low back pain: Secondary | ICD-10-CM | POA: Diagnosis not present

## 2016-05-08 DIAGNOSIS — M5417 Radiculopathy, lumbosacral region: Secondary | ICD-10-CM | POA: Diagnosis not present

## 2016-05-08 DIAGNOSIS — M5416 Radiculopathy, lumbar region: Secondary | ICD-10-CM | POA: Diagnosis not present

## 2016-05-12 ENCOUNTER — Other Ambulatory Visit: Payer: Self-pay | Admitting: Internal Medicine

## 2016-05-12 NOTE — Telephone Encounter (Signed)
Synthroid and Prednisone medication refill sent into pharmacy.

## 2016-05-14 ENCOUNTER — Other Ambulatory Visit: Payer: Self-pay | Admitting: Emergency Medicine

## 2016-05-22 DIAGNOSIS — R42 Dizziness and giddiness: Secondary | ICD-10-CM | POA: Diagnosis not present

## 2016-05-22 DIAGNOSIS — Z7289 Other problems related to lifestyle: Secondary | ICD-10-CM | POA: Diagnosis not present

## 2016-05-22 DIAGNOSIS — Z1159 Encounter for screening for other viral diseases: Secondary | ICD-10-CM | POA: Diagnosis not present

## 2016-06-11 ENCOUNTER — Ambulatory Visit (INDEPENDENT_AMBULATORY_CARE_PROVIDER_SITE_OTHER): Payer: Medicare Other | Admitting: Gastroenterology

## 2016-06-11 ENCOUNTER — Encounter (INDEPENDENT_AMBULATORY_CARE_PROVIDER_SITE_OTHER): Payer: Self-pay

## 2016-06-11 ENCOUNTER — Encounter: Payer: Self-pay | Admitting: Gastroenterology

## 2016-06-11 VITALS — BP 124/76 | HR 68 | Ht 64.0 in | Wt 215.2 lb

## 2016-06-11 DIAGNOSIS — Z86010 Personal history of colonic polyps: Secondary | ICD-10-CM

## 2016-06-11 DIAGNOSIS — K219 Gastro-esophageal reflux disease without esophagitis: Secondary | ICD-10-CM | POA: Diagnosis not present

## 2016-06-11 DIAGNOSIS — I639 Cerebral infarction, unspecified: Secondary | ICD-10-CM | POA: Diagnosis not present

## 2016-06-11 DIAGNOSIS — K589 Irritable bowel syndrome without diarrhea: Secondary | ICD-10-CM | POA: Diagnosis not present

## 2016-06-11 MED ORDER — GLYCOPYRROLATE 2 MG PO TABS
2.0000 mg | ORAL_TABLET | Freq: Two times a day (BID) | ORAL | 11 refills | Status: DC
Start: 2016-06-11 — End: 2016-07-21

## 2016-06-11 NOTE — Patient Instructions (Signed)
We have sent the following medications to your pharmacy for you to pick up at your convenience: robinul.  You will be due for a recall colonoscopy in 08/2018. We will send you a reminder in the mail when it gets closer to that time.  Normal BMI (Body Mass Index- based on height and weight) is between 19 and 25. Your BMI today is Body mass index is 36.95 kg/m. Lawrence Moore Please consider follow up  regarding your BMI with your Primary Care Provider.  Thank you for choosing me and [facility].  Pricilla Riffle. Dagoberto Ligas., MD., Marval Regal

## 2016-06-11 NOTE — Progress Notes (Signed)
    History of Present Illness: This is a 65 year old male referred by Delilah Shan, MD for the evaluation of abdominal pain. Patient relates a long history of alternating diarrhea and constipation associated with crampy lower abdominal pain. Symptoms have been present for many years. He was previously followed by Dr. Silvano Bilis at [facility]. Decided to switch physicians. He has a history of GERD which is well controlled on pantoprazole. He states he was placed on a medication for IBS about 2 years ago which probably was dicyclomine however it did not work well. Has a history of adenomatous colon polyps and we are requesting records from his prior colonoscopies. Prior pathology reports were available in care everywhere and very reviewed and his next colonoscopy date in November 2019 was also available in care everywhere. Denies weight loss, change in stool caliber, melena, hematochezia, nausea, vomiting, dysphagia, chest pain.  Review of Systems: Pertinent positive and negative review of systems were noted in the above HPI section. All other review of systems were otherwise negative.  Current Medications, Allergies, Past Medical History, Past Surgical History, Family History and Social History were reviewed in [employer] record.  Physical Exam: General: Well developed, well nourished, no acute distress Head: Normocephalic and atraumatic Eyes:  sclerae anicteric, EOMI Ears: Normal auditory acuity Mouth: No deformity or lesions Neck: Supple, no masses or thyromegaly Lungs: Clear throughout to auscultation Heart: Regular rate and rhythm; no murmurs, rubs or bruits Abdomen: Soft, non tender and non distended. No masses, hepatosplenomegaly or hernias noted. Normal Bowel sounds Musculoskeletal: Symmetrical with no gross deformities  Skin: No lesions on visible extremities Pulses:  Normal pulses noted Extremities: No clubbing, cyanosis, edema or deformities  noted Neurological: Alert oriented x 4, grossly nonfocal Cervical Nodes:  No significant cervical adenopathy Inguinal Nodes: No significant inguinal adenopathy Psychological:  Alert and cooperative. Normal mood and affect  Assessment and Recommendations:  1. IBS, alternating pattern. May use Imodium twice a day as needed for diarrhea and MiraLAX once or twice daily as needed for constipation. Trial of glycopyrrolate 2 mg by mouth twice a day when necessary for abdominal cramping.  2. GERD. Continue standard antireflux measures and pantoprazole 40 mg daily. Patient states he had an endoscopy performed in the past we are requesting records.  3. Personal history of adenomatous tubulovillous adenomatous colon polyps. Requesting records of prior colonoscopies. Last colonoscopy performed in November 2014 with a previously recommended five-year interval surveillance colonoscopy in November 2019.  cc: Delilah Shan, MD [id number] [facility], [place] [id number]

## 2016-06-24 ENCOUNTER — Ambulatory Visit: Payer: Medicare Other

## 2016-06-24 DIAGNOSIS — L089 Local infection of the skin and subcutaneous tissue, unspecified: Secondary | ICD-10-CM | POA: Diagnosis not present

## 2016-06-24 DIAGNOSIS — S60459A Superficial foreign body of unspecified finger, initial encounter: Secondary | ICD-10-CM | POA: Diagnosis not present

## 2016-07-13 DIAGNOSIS — H43391 Other vitreous opacities, right eye: Secondary | ICD-10-CM | POA: Diagnosis not present

## 2016-07-13 DIAGNOSIS — H40013 Open angle with borderline findings, low risk, bilateral: Secondary | ICD-10-CM | POA: Diagnosis not present

## 2016-07-13 DIAGNOSIS — H4720 Unspecified optic atrophy: Secondary | ICD-10-CM | POA: Diagnosis not present

## 2016-07-13 DIAGNOSIS — H43812 Vitreous degeneration, left eye: Secondary | ICD-10-CM | POA: Diagnosis not present

## 2016-07-13 DIAGNOSIS — H2513 Age-related nuclear cataract, bilateral: Secondary | ICD-10-CM | POA: Diagnosis not present

## 2016-07-14 ENCOUNTER — Encounter: Payer: Self-pay | Admitting: Internal Medicine

## 2016-07-14 ENCOUNTER — Ambulatory Visit (INDEPENDENT_AMBULATORY_CARE_PROVIDER_SITE_OTHER): Payer: Medicare Other | Admitting: Internal Medicine

## 2016-07-14 VITALS — BP 138/82 | HR 81 | Ht 64.0 in | Wt 218.0 lb

## 2016-07-14 DIAGNOSIS — E230 Hypopituitarism: Secondary | ICD-10-CM | POA: Diagnosis not present

## 2016-07-14 DIAGNOSIS — I639 Cerebral infarction, unspecified: Secondary | ICD-10-CM

## 2016-07-14 DIAGNOSIS — M810 Age-related osteoporosis without current pathological fracture: Secondary | ICD-10-CM | POA: Diagnosis not present

## 2016-07-14 LAB — VITAMIN D 25 HYDROXY (VIT D DEFICIENCY, FRACTURES): VITD: 37.36 ng/mL (ref 30.00–100.00)

## 2016-07-14 LAB — T3, FREE: T3, Free: 3.4 pg/mL (ref 2.3–4.2)

## 2016-07-14 LAB — T4, FREE: Free T4: 1.14 ng/dL (ref 0.60–1.60)

## 2016-07-14 NOTE — Patient Instructions (Signed)
Please stop at the lab.  We will call you for the DEXA scan schedule.  Please return in 1 year.

## 2016-07-14 NOTE — Progress Notes (Signed)
Patient ID: Lawrence Moore, male   DOB: [birth year], 65 y.o.   MRN: [id number]   HPI  Lawrence Moore is a 65 y.o.-year-old male, initially referred by his PCP, Dr. Scarlette Ar, for management of hypopituitarism and osteoporosis. He has seen Dr. Tamala Julian ([facility]) in the past. Last visit with me 1 year ago.  Pt was in the ED with dizziness + nausea. He had the first episode on the commode, but the second while walking. In the ED, CT and MRI head were normal, Na was normal. Now 99% resolved. He will see neurology in 5 days.   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 He was started on HRT in 1967, in [facility].  Hypothyroidism:  Levothyroxine 150 mcg, taken: - fasting, with water - he takes the LT4 by itself - 30-35 min before b'fast - he was taking calcium and PPIs with b'fast >> we moved them later (noon) - no iron or MVI  Previous TFTs: Lab Results  Component Value Date   FREET4 1.38 06/28/2014   - no weight gain - no cold intolerance - + constipation, diarrhea - IBS  Adrenal insufficiency:  Prednisone 5 mg in am  He had L surgery 09/2014 >> no problems with anesthesia or recovery.  He was cortisone acetate for a long time >> developed OP. He does have osteoporosis and had fractures, last DEXA scan in 2006.Hydrocortisone gives him itching. No fatigue later in the day.  No weight gain. No diabetes, no HTN.  Hypogonadism:  Was on Androgel patches 5 g - insurance comp. did not approve gel >> Androderm 2 mg daily - stopped in 05/2015 >> would not want to restart.  No erections now, but was having bothersome erections especially at night. No available PSA, CBC or testosterone levels to review.  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  He drinks >1 gallon fluids a day.  Last Na was normal:   Chemistry      Component Value Date/Time    NA 142 05/06/2016 1100   K 3.3 (L) 05/06/2016 1100   CL 105 05/06/2016 1100   CO2 29 05/06/2016 1100   BUN 11 05/06/2016 1100   CREATININE 0.99 05/06/2016 1100      Component Value Date/Time   CALCIUM 9.1 05/06/2016 1100   ALKPHOS 73 05/06/2016 1100   AST 31 05/06/2016 1100   ALT 39 05/06/2016 1100   BILITOT 1.1 05/06/2016 1100     He is not on [place] supplement. He was on [facility] from 1968-1974.   Pt mentions: - + weight gain - no fatigue - + constipation/+ diarrhea - IBS - no hair falling  OP: - last DEXA scan:  08/02/2014 Received records from Dr Tamala Julian - [facility]. DEXA scan 01/07/2014: - spine: T-score -2.5 (+ 11.1% since 12/02/2011)  - LFN: T-score: -2.1 - improved since 2006 - he was on Fosamax: 1998/1999-2012 - now on drug holiday - he takes calcium citrate 500 mg bid, vit D 2000 units daily  He also has a history of PVD, HL, GERD, depression.  ROS: Constitutional: + weight gain, no fatigue, no subjective hyperthermia/hypothermia Eyes: no blurry vision, no xerophthalmia ENT: no sore throat, no nodules palpated in throat, no dysphagia/odynophagia, no hoarseness, + decreased hearing Cardiovascular: no CP/SOB/palpitations/leg swelling Respiratory: no cough/SOB/wheezing Gastrointestinal: no N/V/+ D/+ C Musculoskeletal: no muscle/+ joint  aches (shoulder) Skin: no rashes Neurological: no tremors/numbness/tingling/dizziness  I reviewed pt's medications, allergies, PMH, social hx, family hx, and changes were documented in the history of present illness. Otherwise, unchanged from my initial visit note.  Past Medical History:  Diagnosis Date  . Asthma   . Complication of anesthesia    "woke from anesthia itching" lft knee replacment  . Degenerative joint disease   . Depression   . Diabetes insipidus ([place])   . GERD (gastroesophageal reflux disease)   . Headache    migraines  . Hyperlipidemia   . Hypopituitarism ([place]) 06/28/2014  . Hypothyroidism   . Shortness of  breath dyspnea   . Tuberculosis    have had tb vaccince yrs ago  . Tubular adenoma of colon 2004   Past Surgical History:  Procedure Laterality Date  . ABDOMINAL AORTAGRAM N/A 08/31/2012   Procedure: ABDOMINAL AORTAGRAM;  Surgeon: Rosetta Posner, MD;  Location: [facility] CATH LAB;  Service: Cardiovascular;  Laterality: N/A;  . APPENDECTOMY    . BACK SURGERY    . CHOLECYSTECTOMY  2002   Lap. cholecystectomy  . CRANIOTOMY  1964, 1966   Infratentorial exc. of craniopharyngioma  . JOINT REPLACEMENT  2009   Left Knee replacement  . KNEE ARTHROSCOPY  2006   Right Knee  . KNEE ARTHROSCOPY Left 12  . LUMBAR LAMINECTOMY/DECOMPRESSION MICRODISCECTOMY N/A 10/12/2014   Procedure: Lumbar five-Sacral one microdiskectomy;  Surgeon: Ashok Pall, MD;  Location: [place];  Service: Neurosurgery;  Laterality: N/A;  Lumbar five-Sacral one microdiskectomy  . LUMBAR LAMINECTOMY/DECOMPRESSION MICRODISCECTOMY Right 01/10/2016   Procedure: LUMBAR LAMINECTOMY/DECOMPRESSION MICRODISCECTOMY 1 LEVEL;  Surgeon: Ashok Pall, MD;  Location: [place] NEURO ORS;  Service: Neurosurgery;  Laterality: Right;  Right L5S1 microdiskectomy  . SPINE SURGERY  1998   History   Social History  . Marital Status: Single    Spouse Name: N/A    Number of Children: 0   Occupational History  .    Social History Main Topics  . Smoking status: Former Smoker    Types: Cigarettes    Quit date: 2006  . Smokeless tobacco: No  . Alcohol Use: 1 drink wine/beer  . Drug Use: No   Current Outpatient Prescriptions on File Prior to Visit  Medication Sig Dispense Refill  . calcium carbonate (TUMS EX) 750 MG chewable tablet Chew 1 tablet by mouth 2 (two) times daily as needed for heartburn.    . celecoxib (CELEBREX) 200 MG capsule Take 400 mg by mouth every morning.     . cholecalciferol (VITAMIN D) 1000 UNITS tablet Take 1,000 Units by mouth daily.    Marland Kitchen desmopressin (DDAVP) 0.1 MG tablet Take 1 tablet by mouth  every morning and 1 and 1/2 tablets by  mouth at night 225 tablet 1  . FIBER PO Take 2 capsules by mouth 2 (two) times daily.    Marland Kitchen FLUoxetine (PROZAC) 40 MG capsule Take 40 mg by mouth daily.     . Fluticasone-Salmeterol (ADVAIR) 250-50 MCG/DOSE AEPB Inhale 1 puff into the lungs daily. Mid afternoon    . gabapentin (NEURONTIN) 300 MG capsule Take 600 mg by mouth 3 times/day as needed-between meals & bedtime (pain).     . Ipratropium-Albuterol (COMBIVENT RESPIMAT) 20-100 MCG/ACT AERS respimat Inhale 1 puff into the lungs every 6 (six) hours as needed for wheezing or shortness of breath.    . levothyroxine (SYNTHROID, LEVOTHROID) 150 MCG tablet TAKE 1 TABLET BY MOUTH  DAILY BEFORE BREAKFAST. - LAST REFILL NEEDS LABS 60  tablet 0  . pantoprazole (PROTONIX) 40 MG tablet Take 40 mg by mouth daily.    . pravastatin (PRAVACHOL) 40 MG tablet Take 40 mg by mouth at bedtime.     . predniSONE (DELTASONE) 5 MG tablet Take 1 tablet by mouth  daily 60 tablet 0  . QUEtiapine (SEROQUEL) 25 MG tablet Take 25-50 mg by mouth at bedtime. Based on anxiety level    . SUMAtriptan (IMITREX) 100 MG tablet Take 100 mg by mouth every 2 (two) hours as needed for migraine.     Marland Kitchen glycopyrrolate (ROBINUL) 2 MG tablet Take 1 tablet (2 mg total) by mouth 2 (two) times daily. (Patient not taking: Reported on 07/14/2016) 60 tablet 11   No current facility-administered medications on file prior to visit.    Allergies  Allergen Reactions  . Erythromycin Swelling  . Zocor [Simvastatin] Other (See Comments)    Liver enzyme elevation  . Hydrocodone Other (See Comments)    Unspecified  . Adhesive [Tape] Other (See Comments)    bandaids cause redness - please [place] paper tape  . Ibuprofen Other (See Comments)    abd pain, cramping   Family History  Problem Relation Age of Onset  . Depression Mother   . Hyperlipidemia Mother   . Other Mother     VARICOSE VEINS  . Deep vein thrombosis Father   . Prostate cancer Father   . Depression Sister   . Hyperlipidemia Sister     PE: BP 138/82 (BP Location: Left Arm, Patient Position: Sitting)   Pulse 81   Ht 5\' 4"  (1.626 m)   Wt 218 lb (98.9 kg)   SpO2 96%   BMI 37.42 kg/m  Body mass index is 37.42 kg/m. Wt Readings from Last 3 Encounters:  07/14/16 218 lb (98.9 kg)  06/11/16 215 lb 4 oz (97.6 kg)  05/06/16 214 lb (97.1 kg)   Constitutional: overweight, in NAD Eyes: PERRLA, EOMI, no exophthalmos ENT: moist mucous membranes, no thyromegaly, no cervical lymphadenopathy Cardiovascular: RRR, No MRG Respiratory: CTA B Gastrointestinal: abdomen soft, NT, ND, BS+ Musculoskeletal: no deformities, strength intact in all 4 Skin: moist, warm, no rashes Neurological: no tremor with outstretched hands, DTR normal in all 4  ASSESSMENT: 1. Hypopituitarism  2. Osteoporosis  PLAN:  1. Hypopituitarism A. Central hypothyroidism - she is now taking the Levothyroxine correctly: in am, separated by >30 min from breakfast, and >4h from PPI, calcium, iron, MVI. - will recheck fT4 and fT3 (we cannot use TSH) - continue LT4 150 mcg for now B. Central adrenal insufficiency - on replacement with Prednisone 5 mg daily. He appears properly replaced.  - he is aware of sick days rules:  If you cannot keep anything down, including your hydrocortisone medication, please go to the emergency room or your primary care physician office to get steroids injected in the muscle or vein.  If you have a fever (more than 100 Fahrenheit), please double the dose of your hydrocortisone for the duration of the fever.  Do not run out of your hydrocortisone medication. -has wallet card mentioning "hypopituitarism" C. Hypogonadotropic hypogonadism - was on Androgel 2.5 g daily, but stopped 1 year ago as we had to switch to patches and they caused skin irritation. I again encouraged him to restart more to help with his osteoporosis (although it will not be a large improvement from such a low dose). I suggested to apply Flonase at the patch  site first. If this does not relieve his irritation, I  can formulate an appeal for his insurance company to allow [place] to switch back to testosterone gel. However, he refuses to restart testosterone for now. D. ? 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  E. DI - he is on replacement with ddAVP 1 tab in am and 1/2 tab at night >> will continue same doses - 1x nocturia; if he misses hs dose, he cannot sleep 2/2 increased urination, if he misses the am dose >> thirsty all day - latest sodium normal 2 mo ago  2. Osteoporosis - h/o fractures - BMD improved in last years - was on long-term tx with Fosamax, now on drug holiday - separated calcium citrate in 2 doses - continue vit D replacement, with 2000 IU daily >> will check vit D level  - will plan to get a new DEXA scan   Orders Placed This Encounter  Procedures  . DG Bone Density  . T4, free  . T3, free  . VITAMIN D 25 Hydroxy (Vit-D Deficiency, Fractures)   - time spent with the patient: 40 min  of which >50% was spent in obtaining information about his symptoms, reviewing his previous labs, evaluations, and treatments, counseling him about his endocrine conditions (please see the discussed topics above), and developing a plan to further investigate and treat them.  Office Visit on 07/14/2016  Component Date Value Ref Range Status  . Free T4 07/14/2016 1.14  0.60 - 1.60 ng/dL Final  . T3, Free 07/14/2016 3.4  2.3 - 4.2 pg/mL Final  . VITD 07/14/2016 37.36  30.00 - 100.00 ng/mL Final  DEXA pending.  All labs normal.  Philemon Kingdom, MD PhD [facility] Endocrinology

## 2016-07-15 ENCOUNTER — Telehealth: Payer: Self-pay

## 2016-07-15 MED ORDER — PREDNISONE 5 MG PO TABS: 5.0000 mg | ORAL_TABLET | Freq: Every day | ORAL | 3 refills | Status: AC

## 2016-07-15 MED ORDER — DESMOPRESSIN ACETATE 0.1 MG PO TABS: ORAL_TABLET | ORAL | 3 refills | Status: AC

## 2016-07-15 MED ORDER — LEVOTHYROXINE SODIUM 150 MCG PO TABS: ORAL_TABLET | ORAL | 3 refills | Status: AC

## 2016-07-15 NOTE — Telephone Encounter (Signed)
Called patient and gave lab results. Patient had no questions or concerns.  

## 2016-07-15 NOTE — Telephone Encounter (Signed)
Called and notified patient of Dexa Scan appointment on Monday at 10:30. Patient had no questions or concerns. Advised him where to go, and states that he will be there.

## 2016-07-20 ENCOUNTER — Ambulatory Visit (INDEPENDENT_AMBULATORY_CARE_PROVIDER_SITE_OTHER)
Admission: RE | Admit: 2016-07-20 | Discharge: 2016-07-20 | Disposition: A | Discharge status: Home / Self Care | Payer: Medicare Other | Source: Ambulatory Visit | Attending: Internal Medicine | Admitting: Internal Medicine

## 2016-07-20 DIAGNOSIS — M810 Age-related osteoporosis without current pathological fracture: Secondary | ICD-10-CM | POA: Diagnosis not present

## 2016-07-21 ENCOUNTER — Ambulatory Visit (INDEPENDENT_AMBULATORY_CARE_PROVIDER_SITE_OTHER): Payer: Medicare Other | Admitting: Neurology

## 2016-07-21 ENCOUNTER — Encounter: Payer: Self-pay | Admitting: Neurology

## 2016-07-21 VITALS — BP 140/80 | HR 94 | Ht 64.0 in | Wt 219.0 lb

## 2016-07-21 DIAGNOSIS — R42 Dizziness and giddiness: Secondary | ICD-10-CM | POA: Diagnosis not present

## 2016-07-21 DIAGNOSIS — I639 Cerebral infarction, unspecified: Secondary | ICD-10-CM

## 2016-07-21 DIAGNOSIS — M5382 Other specified dorsopathies, cervical region: Secondary | ICD-10-CM

## 2016-07-21 MED ORDER — TIZANIDINE HCL 2 MG PO TABS
2.0000 mg | ORAL_TABLET | Freq: Three times a day (TID) | ORAL | 0 refills | Status: DC | PRN
Start: 2016-07-21 — End: 2017-01-04

## 2016-07-21 NOTE — Progress Notes (Signed)
NEUROLOGY CONSULTATION NOTE  Lawrence Moore MRN: [id number] DOB: 11-20-1950  Referring provider: Dr. Claiborne Billings Primary care provider: Dr. Claiborne Billings  Reason for consult:  dizziness  HISTORY OF PRESENT ILLNESS: Lawrence Moore is a 65 year old man with depression, hypopituitarism, hypothyroidism, diabetes insipidus, progressive sensorineural hearing loss, migraine, hyperlipidemia, and history of craniotomy secondary to [place], who presents for dizziness.  History obtained by patient, his sister (who accompanies him) and ED note.  In July, he developed dizziness.  It is not a spinning sensation but it is difficult to explain.  He feels off-balance and veers towards the left.  One time was associated with nausea.  The first time it occurred, he was trying to have a bowel movement and it happened suddenly.  There was no associated headache, spinning sensation, slurred speech, visual disturbance, or focal numbness or weakness.  It lasted 5 minutes.  He presented to the ED 3 days later on 05/06/16 after a second episode, which persisted.  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  He continues to have the dizziness, which is fairly constant but fluctuates.  It has improved, but he is uncertain if he is just getting used to it.  He had vertigo once many years ago, which was different and much more severe.  Over the past week, he developed a left sided posterior neck pain radiating into the shoulder.  There is no radicular pain or numbness or weakness of the left arm.  He denies trauma.  He has tried ice and heat.   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  He does not remember the cause.  He has progressive sensorineural hearing loss, presumed to be due to steroid use.  PAST MEDICAL HISTORY: Past Medical History:  Diagnosis Date  . Asthma   . Complication of anesthesia    "woke from anesthia  itching" lft knee replacment  . Degenerative joint disease   . Depression   . Diabetes insipidus ([place])   . GERD (gastroesophageal reflux disease)   . Headache    migraines  . Hyperlipidemia   . Hypopituitarism ([place]) 06/28/2014  . Hypothyroidism   . Shortness of breath dyspnea   . Tuberculosis    have had tb vaccince yrs ago  . Tubular adenoma of colon 2004    PAST SURGICAL HISTORY: Past Surgical History:  Procedure Laterality Date  . ABDOMINAL AORTAGRAM N/A 08/31/2012   Procedure: ABDOMINAL AORTAGRAM;  Surgeon: Rosetta Posner, MD;  Location: [facility] CATH LAB;  Service: Cardiovascular;  Laterality: N/A;  . APPENDECTOMY    . BACK SURGERY    . CHOLECYSTECTOMY  2002   Lap. cholecystectomy  . CRANIOTOMY  1964, 1966   Infratentorial exc. of craniopharyngioma  . JOINT REPLACEMENT  2009   Left Knee replacement  . KNEE ARTHROSCOPY  2006   Right Knee  . KNEE ARTHROSCOPY Left 12  . LUMBAR LAMINECTOMY/DECOMPRESSION MICRODISCECTOMY N/A 10/12/2014   Procedure: Lumbar five-Sacral one microdiskectomy;  Surgeon: Ashok Pall, MD;  Location: [place];  Service: Neurosurgery;  Laterality: N/A;  Lumbar five-Sacral one microdiskectomy  . LUMBAR LAMINECTOMY/DECOMPRESSION MICRODISCECTOMY Right 01/10/2016   Procedure: LUMBAR LAMINECTOMY/DECOMPRESSION MICRODISCECTOMY 1 LEVEL;  Surgeon: Ashok Pall, MD;  Location: [place] NEURO ORS;  Service: Neurosurgery;  Laterality: Right;  Right L5S1 microdiskectomy  . SPINE SURGERY  1998    MEDICATIONS: Current Outpatient Prescriptions on File Prior to Visit  Medication Sig Dispense  Refill  . calcium carbonate (TUMS EX) 750 MG chewable tablet Chew 1 tablet by mouth 2 (two) times daily as needed for heartburn.    . celecoxib (CELEBREX) 200 MG capsule Take 400 mg by mouth every morning.     . cholecalciferol (VITAMIN D) 1000 UNITS tablet Take 1,000 Units by mouth daily.    Marland Kitchen desmopressin (DDAVP) 0.1 MG tablet Take 1 tablet by mouth  every morning and 1 and 1/2 tablets by mouth  at night 225 tablet 3  . FIBER PO Take 2 capsules by mouth 2 (two) times daily.    Marland Kitchen FLUoxetine (PROZAC) 40 MG capsule Take 40 mg by mouth daily.     . Fluticasone-Salmeterol (ADVAIR) 250-50 MCG/DOSE AEPB Inhale 1 puff into the lungs daily. Mid afternoon    . gabapentin (NEURONTIN) 300 MG capsule Take 600 mg by mouth at bedtime.     . Ipratropium-Albuterol (COMBIVENT RESPIMAT) 20-100 MCG/ACT AERS respimat Inhale 1 puff into the lungs every 6 (six) hours as needed for wheezing or shortness of breath.    . levothyroxine (SYNTHROID, LEVOTHROID) 150 MCG tablet TAKE 1 TABLET BY MOUTH  DAILY BEFORE BREAKFAST. 90 tablet 3  . pantoprazole (PROTONIX) 40 MG tablet Take 40 mg by mouth daily.    . pravastatin (PRAVACHOL) 40 MG tablet Take 40 mg by mouth at bedtime.     . predniSONE (DELTASONE) 5 MG tablet Take 1 tablet (5 mg total) by mouth daily. 100 tablet 3  . QUEtiapine (SEROQUEL) 25 MG tablet Take 25-50 mg by mouth at bedtime. Based on anxiety level    . SUMAtriptan (IMITREX) 100 MG tablet Take 100 mg by mouth every 2 (two) hours as needed for migraine.      No current facility-administered medications on file prior to visit.     ALLERGIES: Allergies  Allergen Reactions  . Erythromycin Swelling  . Zocor [Simvastatin] Other (See Comments)    Liver enzyme elevation  . Hydrocodone Other (See Comments)    Unspecified  . Adhesive [Tape] Other (See Comments)    bandaids cause redness - please [place] paper tape  . Ibuprofen Other (See Comments)    abd pain, cramping    FAMILY HISTORY: Family History  Problem Relation Age of Onset  . Depression Mother   . Hyperlipidemia Mother   . Other Mother     VARICOSE VEINS  . Deep vein thrombosis Father   . Prostate cancer Father   . Depression Sister   . Hyperlipidemia Sister     SOCIAL HISTORY: Social History   Social History  . Marital status: Single    Spouse name: N/A  . Number of children: N/A  . Years of education: N/A   Occupational  History  . Not on file.   Social History Main Topics  . Smoking status: Former Smoker    Packs/day: 3.00    Years: 33.00    Types: Cigarettes    Quit date: 01/24/2005  . Smokeless tobacco: Never Used  . Alcohol use Yes     Comment: 2-3 times a year  . Drug use: No  . Sexual activity: Not on file   Other Topics Concern  . Not on file   Social History Narrative  . No narrative on file    REVIEW OF SYSTEMS: Constitutional: No fevers, chills, or sweats, no generalized fatigue, change in appetite Eyes: No visual changes, double vision, eye pain Ear, nose and throat: No hearing loss, ear pain, nasal congestion, sore throat Cardiovascular: No  chest pain, palpitations Respiratory:  No shortness of breath at rest or with exertion, wheezes GastrointestinaI: No nausea, vomiting, diarrhea, abdominal pain, fecal incontinence Genitourinary:  No dysuria, urinary retention or frequency Musculoskeletal:  No neck pain, back pain Integumentary: No rash, pruritus, skin lesions Neurological: as above Psychiatric: No depression, insomnia, anxiety Endocrine: No palpitations, fatigue, diaphoresis, mood swings, change in appetite, change in weight, increased thirst Hematologic/Lymphatic:  No purpura, petechiae. Allergic/Immunologic: no itchy/runny eyes, nasal congestion, recent allergic reactions, rashes  PHYSICAL EXAM: Vitals:   07/21/16 0956  BP: 140/80  Pulse: 94   General: No acute distress.  Patient appears well-groomed.  Head:  Normocephalic/atraumatic Eyes:  fundi examined but not visualized Neck: supple, no paraspinal tenderness, full range of motion Back: No paraspinal tenderness Heart: regular rate and rhythm Lungs: Clear to auscultation bilaterally. Vascular: No carotid bruits. Neurological Exam: Mental status: alert and oriented to person, place, and time, recent and remote memory intact, fund of knowledge intact, attention and concentration intact, speech fluent and not  dysarthric, language intact. Cranial nerves: CN I: not tested CN II: pupils equal, round and reactive to light, visual fields intact CN III, IV, VI:  full range of motion, no nystagmus, no ptosis CN V: facial sensation intact CN VII: upper and lower face symmetric CN VIII: hearing intact CN IX, X: gag intact, uvula midline CN XI: sternocleidomastoid and trapezius muscles intact CN XII: tongue midline Bulk & Tone: normal, no fasciculations. Motor:  5/5 throughout  Sensation:  Pinprick slightly reduced in right big toe and vibration sensation intact. . Deep Tendon Reflexes:  2+ throughout, toes downgoing.  Finger to nose testing:  Without dysmetria.  Heel to shin:  Without dysmetria.  Gait:  Normal station and stride.  Able to turn and tandem walk. Romberg negative.  IMPRESSION: Dizziness.  Unclear etiology.  Not clear vertigo.  No lateralizing symptoms.  No stroke or mass lesion on MRI of brain.  Could it be related to hearing loss? Musculoskeletal neck pain.  PLAN: 1.  Prescribed tizanidine for neck pain.  Cautioned about drowsiness. Should follow up with PCP. 2.  Follow up as needed.  Thank you for allowing me to take part in the care of this patient.  Metta Clines, DO  CC:  Scarlette Ar, MD

## 2016-07-21 NOTE — Patient Instructions (Signed)
I have no real explanation for the dizziness.  The neck and shoulder pain seems musculoskeletal.  I will give you tizanidine 2mg  (a muscle relaxer).  May take every 8 hours as needed for neck pain.  Follow up with you PCP. Follow up as needed.

## 2016-07-21 NOTE — Progress Notes (Signed)
Note sent to Dr. Claiborne Billings.

## 2016-08-06 ENCOUNTER — Ambulatory Visit: Payer: Medicare Other | Admitting: Internal Medicine

## 2016-08-12 DIAGNOSIS — M7070 Other bursitis of hip, unspecified hip: Secondary | ICD-10-CM | POA: Diagnosis not present

## 2016-08-12 DIAGNOSIS — M25512 Pain in left shoulder: Secondary | ICD-10-CM | POA: Diagnosis not present

## 2016-08-12 DIAGNOSIS — M810 Age-related osteoporosis without current pathological fracture: Secondary | ICD-10-CM | POA: Diagnosis not present

## 2016-08-12 DIAGNOSIS — M150 Primary generalized (osteo)arthritis: Secondary | ICD-10-CM | POA: Diagnosis not present

## 2016-08-24 ENCOUNTER — Encounter: Payer: Self-pay | Admitting: Family

## 2016-08-24 ENCOUNTER — Ambulatory Visit (INDEPENDENT_AMBULATORY_CARE_PROVIDER_SITE_OTHER): Payer: Medicare Other | Admitting: Family

## 2016-08-24 VITALS — BP 132/78 | HR 75 | Temp 98.4°F | Resp 20 | Ht 64.0 in | Wt 222.0 lb

## 2016-08-24 DIAGNOSIS — F324 Major depressive disorder, single episode, in partial remission: Secondary | ICD-10-CM | POA: Diagnosis not present

## 2016-08-24 DIAGNOSIS — F329 Major depressive disorder, single episode, unspecified: Secondary | ICD-10-CM | POA: Insufficient documentation

## 2016-08-24 DIAGNOSIS — F32A Depression, unspecified: Secondary | ICD-10-CM | POA: Insufficient documentation

## 2016-08-24 DIAGNOSIS — I639 Cerebral infarction, unspecified: Secondary | ICD-10-CM

## 2016-08-24 DIAGNOSIS — K219 Gastro-esophageal reflux disease without esophagitis: Secondary | ICD-10-CM

## 2016-08-24 DIAGNOSIS — M549 Dorsalgia, unspecified: Secondary | ICD-10-CM | POA: Diagnosis not present

## 2016-08-24 MED ORDER — QUETIAPINE FUMARATE 25 MG PO TABS: 25.0000 mg | ORAL_TABLET | Freq: Every day | ORAL | 0 refills | Status: DC

## 2016-08-24 MED ORDER — PRAVASTATIN SODIUM 40 MG PO TABS: 40.0000 mg | ORAL_TABLET | Freq: Every day | ORAL | 0 refills | Status: DC

## 2016-08-24 MED ORDER — PANTOPRAZOLE SODIUM 40 MG PO TBEC: 40.0000 mg | DELAYED_RELEASE_TABLET | Freq: Every day | ORAL | 0 refills | Status: DC

## 2016-08-24 MED ORDER — GABAPENTIN 300 MG PO CAPS: 600.0000 mg | ORAL_CAPSULE | Freq: Every day | ORAL | 0 refills | Status: DC

## 2016-08-24 NOTE — Patient Instructions (Signed)
Thank you for choosing [facility].  SUMMARY AND INSTRUCTIONS:  Ice / moist x 20 minutes every 2 hours as needed and after activity.  Stretches and exercises 1-2 times per day.   Medication:  Continue to take medications as prescribed.   Your prescription(s) have been submitted to your pharmacy or been printed and provided for you. Please take as directed and contact our office if you believe you are having problem(s) with the medication(s) or have any questions.  Follow up:  If your symptoms worsen or fail to improve, please contact our office for further instruction, or in case of emergency go directly to the emergency room at the closest medical facility.     Mid-Back Strain With Rehab  A strain is an injury in which a tendon or muscle is torn. The muscles and tendons of the mid-back are vulnerable to strains. However, these muscles and tendons are very strong and require a great force to be injured. The muscles of the mid-back are responsible for stabilizing the spinal column, as well as spinal twisting (rotation). Strains are classified into three categories. Grade 1 strains cause pain, but the tendon is not lengthened. Grade 2 strains include a lengthened ligament, due to the ligament being stretched or partially ruptured. With grade 2 strains there is still function, although the function may be decreased. Grade 3 strains involve a complete tear of the tendon or muscle, and function is usually impaired. SYMPTOMS   Pain in the middle of the back.  Pain that may affect only one side, and is worse with movement.  Muscle spasms, and often swelling in the back.  Loss of strength of the back muscles.  Crackling sound (crepitation) when the muscles are touched. CAUSES  Mid-back strains occur when a force is placed on the muscles or tendons that is greater than they can handle. Common causes of injury include:  Ongoing overuse of the muscle-tendon units in the middle back,  usually from incorrect body posture.  A single violent injury or force applied to the back. RISK INCREASES WITH:  Sports that involve twisting forces on the spine or a lot of bending at the waist (football, rugby, weightlifting, bowling, golf, tennis, speed skating, racquetball, swimming, running, gymnastics, diving).  Poor strength and flexibility.  Failure to warm up properly before activity.  Family history of low back pain or disk disorders.  Previous back injury or surgery (especially fusion). PREVENTION  Learn and use proper sports technique.  Warm up and stretch properly before activity.  Allow for adequate recovery between workouts.  Maintain physical fitness:  Strength, flexibility, and endurance.  Cardiovascular fitness. PROGNOSIS  If treated properly, mid-back strains usually heal within 6 weeks. RELATED COMPLICATIONS   Frequently recurring symptoms, resulting in a chronic problem. Properly treating the problem the first time decreases frequency of recurrence.  Chronic inflammation, scarring, and partial muscle-tendon tear.  Delayed healing or resolution of symptoms, especially if activity is resumed too soon.  Prolonged disability. TREATMENT Treatment first involves the use of ice and medicine, to reduce pain and inflammation. As the pain begins to subside, you may begin strengthening and stretching exercises to improve body posture and sport technique. These exercises may be performed at home or with a therapist. Severe injuries may require referral to a therapist for further evaluation and treatment, such as ultrasound. Corticosteroid injections may be given to help reduce inflammation. Biofeedback (watching monitors of your body processes) and psychotherapy may also be prescribed. Prolonged bed rest is felt  to do more harm than good. Massage may help break the muscle spasms. Sometimes, an injection of cortisone, with or without local anesthetics, may be given to  help relieve the pain and spasms. MEDICATION   If pain medicine is needed, nonsteroidal anti-inflammatory medicines (aspirin and ibuprofen), or other minor pain relievers (acetaminophen), are often advised.  Do not take pain medicine for 7 days before surgery.  Prescription pain relievers may be given, if your caregiver thinks they are needed. Use only as directed and only as much as you need.  Ointments applied to the skin may be helpful.  Corticosteroid injections may be given by your caregiver. These injections should be reserved for the most serious cases, because they may only be given a certain number of times. HEAT AND COLD:   Cold treatment (icing) should be applied for 10 to 15 minutes every 2 to 3 hours for inflammation and pain, and immediately after activity that aggravates your symptoms. Use ice packs or an ice massage.  Heat treatment may be used before performing stretching and strengthening activities prescribed by your caregiver, physical therapist, or athletic trainer. Use a heat pack or a warm water soak. SEEK IMMEDIATE MEDICAL CARE IF:  Symptoms get worse or do not improve in 2 to 4 weeks, despite treatment.  You develop numbness, weakness, or loss of bowel or bladder function.  New, unexplained symptoms develop. (Drugs used in treatment may produce side effects.) EXERCISES RANGE OF MOTION (ROM) AND STRETCHING EXERCISES - Mid-Back Strain These exercises may help you when beginning to rehabilitate your injury. In order to successfully resolve your symptoms, you must improve your posture. These exercises are designed to help reduce the forward-head and rounded-shoulder posture which contributes to this condition. Your symptoms may resolve with or without further involvement from your physician, physical therapist or athletic trainer. While completing these exercises, remember:   Restoring tissue flexibility helps normal motion to return to the joints. This allows  healthier, less painful movement and activity.  An effective stretch should be held for at least 30 seconds.  A stretch should never be painful. You should only feel a gentle lengthening or release in the stretched tissue. STRETCH - Axial Extension  Stand or sit on a firm surface. Assume a good posture: chest up, shoulders drawn back, stomach muscles slightly tense, knees unlocked (if standing) and feet hip width apart.  Slowly retract your chin, so your head slides back and your chin slightly lowers. Continue to look straight ahead.  You should feel a gentle stretch in the back of your head. Be certain not to feel an aggressive stretch since this can cause headaches later.  Hold for __________ seconds. Repeat __________ times. Complete this exercise __________ times per day. RANGE OF MOTION- Upper Thoracic Extension  Sit on a firm chair with a high back. Assume a good posture: chest up, shoulders drawn back, abdominal muscles slightly tense, and feet hip width apart. Place a small pillow or folded towel in the curve of your lower back, if you are having difficulty maintaining good posture.  Gently brace your neck with your hands, allowing your arms to rest on your chest.  Continue to support your neck and slowly extend your back over the chair. You will feel a stretch across your upper back.  Hold __________ seconds. Slowly return to the starting position. Repeat __________ times. Complete this exercise __________ times per day. RANGE OF MOTION- Mid-Thoracic Extension  Roll a towel so that it is about 4  inches in diameter.  Position the towel lengthwise. Lay on the towel so that your spine, but not your shoulder blades, are supported.  You should feel your mid-back arching toward the floor. To increase the stretch, extend your arms away from your body.  Hold for __________ seconds. Repeat exercise __________ times, __________ times per day. STRENGTHENING EXERCISES - Mid-Back  Strain These exercises may help you when beginning to rehabilitate your injury. They may resolve your symptoms with or without further involvement from your physician, physical therapist or athletic trainer. While completing these exercises, remember:   Muscles can gain both the endurance and the strength needed for everyday activities through controlled exercises.  Complete these exercises as instructed by your physician, physical therapist or athletic trainer. Increase the resistance and repetitions only as guided by your caregiver.  You may experience muscle soreness or fatigue, but the pain or discomfort you are trying to eliminate should never worsen during these exercises. If this pain does worsen, stop and make certain you are following the directions exactly. If the pain is still present after adjustments, discontinue the exercise until you can discuss the trouble with your caregiver. STRENGTHENING - Quadruped, Opposite UE/LE Lift  Assume a hands and knees position on a firm surface. Keep your hands under your shoulders and your knees under your hips. You may place padding under your knees for comfort.  Find your neutral spine and gently tense your abdominal muscles so that you can maintain this position. Your shoulders and hips should form a rectangle that is parallel with the floor and is not twisted.  Keeping your trunk steady, lift your right hand no higher than your shoulder and then your left leg no higher than your hip. Make sure you are not holding your breath. Hold this position __________ seconds.  Continuing to keep your abdominal muscles tense and your back steady, slowly return to your starting position. Repeat with the opposite arm and leg. Repeat __________ times. Complete this exercise __________ times per day.  STRENGTH - Shoulder Extensors  Secure a rubber exercise band or tubing to a fixed object (table, pole) so that it is at the height of your shoulders when you are  either standing, or sitting on a firm armless chair.  With a thumbs-up grip, grasp an end of the band in each hand. Straighten your elbows and lift your hands straight in front of you at shoulder height. Step back away from the secured end of band, until it becomes tense.  Squeezing your shoulder blades together, pull your hands down to the sides of your thighs. Do not allow your hands to go behind you.  Hold for __________ seconds. Slowly ease the tension on the band, as you reverse the directions and return to the starting position. Repeat __________ times. Complete this exercise __________ times per day.  STRENGTH - Horizontal Abductors Choose one of the two positions to complete this exercise. Prone: lying on stomach:  Lie on your stomach on a firm surface so that your right / left arm overhangs the edge. Rest your forehead on your opposite forearm. With your palm facing the floor and your elbow straight, hold a __________ weight in your hand.  Squeeze your right / left shoulder blade to your mid-back spine and then slowly raise your arm to the height of the bed.  Hold for __________ seconds. Slowly reverse the directions and return to the starting position, controlling the weight as you lower your arm. Repeat __________ times. Complete  this exercise __________ times per day. Standing:   Secure a rubber exercise band or tubing, so that it is at the height of your shoulders when you are either standing, or sitting on a firm armless chair.  Grasp an end of the band in each hand and have your palms face each other. Straighten your elbows and lift your hands straight in front of you at shoulder height. Step back away from the secured end of band, until it becomes tense.  Squeeze your shoulder blades together. Keeping your elbows locked and your hands at shoulder height, spread your arms apart, forming a "T" shape with your body. Hold __________ seconds. Slowly ease the tension on the band, as  you reverse the directions and return to the starting position. Repeat __________ times. Complete this exercise __________ times per day. STRENGTH - Scapular Retractors and External Rotators, Rowing  Secure a rubber exercise band or tubing, so that it is at the height of your shoulders when you are either standing, or sitting on a firm armless chair.  With a palm-down grip, grasp an end of the band in each hand. Straighten your elbows and lift your hands straight in front of you at shoulder height. Step back away from the secured end of band, until it becomes tense.  Step 1: Squeeze your shoulder blades together. Bending your elbows, draw your hands to your chest as if you are rowing a boat. At the end of this motion, your hands and elbow should be at shoulder height and your elbows should be out to your sides.  Step 2: Rotate your shoulder to raise your hands above your head. Your forearms should be vertical and your upper arms should be horizontal.  Hold for __________ seconds. Slowly ease the tension on the band, as you reverse the directions and return to the starting position. Repeat __________ times. Complete this exercise __________ times per day.  POSTURE AND BODY MECHANICS CONSIDERATIONS - Mid-Back Strain Keeping correct posture when sitting, standing or completing your activities will reduce the stress put on different body tissues, allowing injured tissues a chance to heal and limiting painful experiences. The following are general guidelines for improved posture. Your physician or physical therapist will provide you with any instructions specific to your needs. While reading these guidelines, remember:  The exercises prescribed by your provider will help you have the flexibility and strength to maintain correct postures.  The correct posture provides the best environment for your joints to work. All of your joints have less wear and tear when properly supported by a spine with good  posture. This means you will experience a healthier, less painful body.  Correct posture must be practiced with all of your activities, especially prolonged sitting and standing. Correct posture is as important when doing repetitive low-stress activities (typing) as it is when doing a single heavy-load activity (lifting). PROPER SITTING POSTURE In order to minimize stress and discomfort on your spine, you must sit with correct posture. Sitting with good posture should be effortless for a healthy body. Returning to good posture is a gradual process. Many people can work toward this most comfortably by using various supports until they have the flexibility and strength to maintain this posture on their own. When sitting with proper posture, your ears will fall over your shoulders and your shoulders will fall over your hips. You should use the back of the chair to support your upper back. Your lower back will be in a neutral position, just  slightly arched. You may place a small pillow or folded towel at the base of your low back for  support.  When working at a desk, create an environment that supports good, upright posture. Without extra support, muscles fatigue and lead to excessive strain on joints and other tissues. Keep these recommendations in mind: CHAIR:  A chair should be able to slide under your desk when your back makes contact with the back of the chair. This allows you to work closely.  The chair's height should allow your eyes to be level with the upper part of your monitor and your hands to be slightly lower than your elbows. BODY POSITION  Your feet should make contact with the floor. If this is not possible, use a foot rest.  Keep your ears over your shoulders. This will reduce stress on your neck and lower back. INCORRECT SITTING POSTURES If you are feeling tired and unable to assume a healthy sitting posture, do not slouch or slump. This puts excessive strain on your back tissues,  causing more damage and pain. Healthier options include:  Using more support, like a lumbar pillow.  Switching tasks to something that requires you to be upright or walking.  Talking a brief walk.  Lying down to rest in a neutral-spine position. CORRECT STANDING POSTURES Proper standing posture should be assumed with all daily activities, even if they only take a few moments, like when brushing your teeth. As in sitting, your ears should fall over your shoulders and your shoulders should fall over your hips. You should keep a slight tension in your abdominal muscles to brace your spine. Your tailbone should point down to the ground, not behind your body, resulting in an over-extended swayback posture.  INCORRECT STANDING POSTURES Common incorrect standing postures include a forward head, locked knees, and an excessive swayback. WALKING Walk with an upright posture. Your ears, shoulders and hips should all line-up. CORRECT LIFTING TECHNIQUES DO :   Assume a wide stance. This will provide you more stability and the opportunity to get as close as possible to the object which you are lifting.  Tense your abdominals to brace your spine. Bend at the knees and hips. Keeping your back locked in a neutral-spine position, lift using your leg muscles. Lift with your legs, keeping your back straight.  Test the weight of unknown objects before attempting to lift them.  Try to keep your elbows locked down at your sides in order get the best strength from your shoulders when carrying an object.  Always ask for help when lifting heavy or awkward objects. INCORRECT LIFTING TECHNIQUES DO NOT:   Lock your knees when lifting, even if it is a small object.  Bend and twist. Pivot at your feet or move your feet when needing to change directions.  Assume that you can safely pick up even a paperclip without proper posture.   This information is not intended to replace advice given to you by your health  care provider. Make sure you discuss any questions you have with your health care provider.   Document Released: 10/12/2005 Document Revised: 02/26/2015 Document Reviewed: 01/24/2009 [employer] Interactive Patient Education 2016 [employer]   Cervical Strain and Sprain With Rehab Cervical strain and sprain are injuries that commonly occur with "whiplash" injuries. Whiplash occurs when the neck is forcefully whipped backward or forward, such as during a motor vehicle accident or during contact sports. The muscles, ligaments, tendons, discs, and nerves of the neck are susceptible to  injury when this occurs. RISK FACTORS Risk of having a whiplash injury increases if:  Osteoarthritis of the spine.  Situations that make head or neck accidents or trauma more likely.  High-risk sports (football, rugby, wrestling, hockey, auto racing, gymnastics, diving, contact karate, or boxing).  Poor strength and flexibility of the neck.  Previous neck injury.  Poor tackling technique.  Improperly fitted or padded equipment. SYMPTOMS   Pain or stiffness in the front or back of neck or both.  Symptoms may present immediately or up to 24 hours after injury.  Dizziness, headache, nausea, and vomiting.  Muscle spasm with soreness and stiffness in the neck.  Tenderness and swelling at the injury site. PREVENTION  Learn and use proper technique (avoid tackling with the head, spearing, and head-butting; use proper falling techniques to avoid landing on the head).  Warm up and stretch properly before activity.  Maintain physical fitness:  Strength, flexibility, and endurance.  Cardiovascular fitness.  Wear properly fitted and padded protective equipment, such as padded soft collars, for participation in contact sports. PROGNOSIS  Recovery from cervical strain and sprain injuries is dependent on the extent of the injury. These injuries are usually curable in 1 week to 3 months with appropriate  treatment.  RELATED COMPLICATIONS   Temporary numbness and weakness may occur if the nerve roots are damaged, and this may persist until the nerve has completely healed.  Chronic pain due to frequent recurrence of symptoms.  Prolonged healing, especially if activity is resumed too soon (before complete recovery). TREATMENT  Treatment initially involves the use of ice and medication to help reduce pain and inflammation. It is also important to perform strengthening and stretching exercises and modify activities that worsen symptoms so the injury does not get worse. These exercises may be performed at home or with a therapist. For patients who experience severe symptoms, a soft, padded collar may be recommended to be worn around the neck.  Improving your posture may help reduce symptoms. Posture improvement includes pulling your chin and abdomen in while sitting or standing. If you are sitting, sit in a firm chair with your buttocks against the back of the chair. While sleeping, try replacing your pillow with a small towel rolled to 2 inches in diameter, or use a cervical pillow or soft cervical collar. Poor sleeping positions delay healing.  For patients with nerve root damage, which causes numbness or weakness, the use of a cervical traction apparatus may be recommended. Surgery is rarely necessary for these injuries. However, cervical strain and sprains that are present at birth (congenital) may require surgery. MEDICATION   If pain medication is necessary, nonsteroidal anti-inflammatory medications, such as aspirin and ibuprofen, or other minor pain relievers, such as acetaminophen, are often recommended.  Do not take pain medication for 7 days before surgery.  Prescription pain relievers may be given if deemed necessary by your caregiver. Use only as directed and only as much as you need. HEAT AND COLD:   Cold treatment (icing) relieves pain and reduces inflammation. Cold treatment should be  applied for 10 to 15 minutes every 2 to 3 hours for inflammation and pain and immediately after any activity that aggravates your symptoms. Use ice packs or an ice massage.  Heat treatment may be used prior to performing the stretching and strengthening activities prescribed by your caregiver, physical therapist, or athletic trainer. Use a heat pack or a warm soak. SEEK MEDICAL CARE IF:   Symptoms get worse or do not improve  in 2 weeks despite treatment.  New, unexplained symptoms develop (drugs used in treatment may produce side effects). EXERCISES RANGE OF MOTION (ROM) AND STRETCHING EXERCISES - Cervical Strain and Sprain These exercises may help you when beginning to rehabilitate your injury. In order to successfully resolve your symptoms, you must improve your posture. These exercises are designed to help reduce the forward-head and rounded-shoulder posture which contributes to this condition. Your symptoms may resolve with or without further involvement from your physician, physical therapist or athletic trainer. While completing these exercises, remember:   Restoring tissue flexibility helps normal motion to return to the joints. This allows healthier, less painful movement and activity.  An effective stretch should be held for at least 20 seconds, although you may need to begin with shorter hold times for comfort.  A stretch should never be painful. You should only feel a gentle lengthening or release in the stretched tissue. STRETCH- Axial Extensors  Lie on your back on the floor. You may bend your knees for comfort. Place a rolled-up hand towel or dish towel, about 2 inches in diameter, under the part of your head that makes contact with the floor.  Gently tuck your chin, as if trying to make a "double chin," until you feel a gentle stretch at the base of your head.  Hold __________ seconds. Repeat __________ times. Complete this exercise __________ times per day.  STRETCH - Axial  Extension   Stand or sit on a firm surface. Assume a good posture: chest up, shoulders drawn back, abdominal muscles slightly tense, knees unlocked (if standing) and feet hip width apart.  Slowly retract your chin so your head slides back and your chin slightly lowers. Continue to look straight ahead.  You should feel a gentle stretch in the back of your head. Be certain not to feel an aggressive stretch since this can cause headaches later.  Hold for __________ seconds. Repeat __________ times. Complete this exercise __________ times per day. STRETCH - Cervical Side Bend   Stand or sit on a firm surface. Assume a good posture: chest up, shoulders drawn back, abdominal muscles slightly tense, knees unlocked (if standing) and feet hip width apart.  Without letting your nose or shoulders move, slowly tip your right / left ear to your shoulder until your feel a gentle stretch in the muscles on the opposite side of your neck.  Hold __________ seconds. Repeat __________ times. Complete this exercise __________ times per day. STRETCH - Cervical Rotators   Stand or sit on a firm surface. Assume a good posture: chest up, shoulders drawn back, abdominal muscles slightly tense, knees unlocked (if standing) and feet hip width apart.  Keeping your eyes level with the ground, slowly turn your head until you feel a gentle stretch along the back and opposite side of your neck.  Hold __________ seconds. Repeat __________ times. Complete this exercise __________ times per day. RANGE OF MOTION - Neck Circles   Stand or sit on a firm surface. Assume a good posture: chest up, shoulders drawn back, abdominal muscles slightly tense, knees unlocked (if standing) and feet hip width apart.  Gently roll your head down and around from the back of one shoulder to the back of the other. The motion should never be forced or painful.  Repeat the motion 10-20 times, or until you feel the neck muscles relax and  loosen. Repeat __________ times. Complete the exercise __________ times per day. STRENGTHENING EXERCISES - Cervical Strain and Sprain These exercises  may help you when beginning to rehabilitate your injury. They may resolve your symptoms with or without further involvement from your physician, physical therapist, or athletic trainer. While completing these exercises, remember:   Muscles can gain both the endurance and the strength needed for everyday activities through controlled exercises.  Complete these exercises as instructed by your physician, physical therapist, or athletic trainer. Progress the resistance and repetitions only as guided.  You may experience muscle soreness or fatigue, but the pain or discomfort you are trying to eliminate should never worsen during these exercises. If this pain does worsen, stop and make certain you are following the directions exactly. If the pain is still present after adjustments, discontinue the exercise until you can discuss the trouble with your clinician. STRENGTH - Cervical Flexors, Isometric  Face a wall, standing about 6 inches away. Place a small pillow, a ball about 6-8 inches in diameter, or a folded towel between your forehead and the wall.  Slightly tuck your chin and gently push your forehead into the soft object. Push only with mild to moderate intensity, building up tension gradually. Keep your jaw and forehead relaxed.  Hold 10 to 20 seconds. Keep your breathing relaxed.  Release the tension slowly. Relax your neck muscles completely before you start the next repetition. Repeat __________ times. Complete this exercise __________ times per day. STRENGTH- Cervical Lateral Flexors, Isometric   Stand about 6 inches away from a wall. Place a small pillow, a ball about 6-8 inches in diameter, or a folded towel between the side of your head and the wall.  Slightly tuck your chin and gently tilt your head into the soft object. Push only with  mild to moderate intensity, building up tension gradually. Keep your jaw and forehead relaxed.  Hold 10 to 20 seconds. Keep your breathing relaxed.  Release the tension slowly. Relax your neck muscles completely before you start the next repetition. Repeat __________ times. Complete this exercise __________ times per day. STRENGTH - Cervical Extensors, Isometric   Stand about 6 inches away from a wall. Place a small pillow, a ball about 6-8 inches in diameter, or a folded towel between the back of your head and the wall.  Slightly tuck your chin and gently tilt your head back into the soft object. Push only with mild to moderate intensity, building up tension gradually. Keep your jaw and forehead relaxed.  Hold 10 to 20 seconds. Keep your breathing relaxed.  Release the tension slowly. Relax your neck muscles completely before you start the next repetition. Repeat __________ times. Complete this exercise __________ times per day. POSTURE AND BODY MECHANICS CONSIDERATIONS - Cervical Strain and Sprain Keeping correct posture when sitting, standing or completing your activities will reduce the stress put on different body tissues, allowing injured tissues a chance to heal and limiting painful experiences. The following are general guidelines for improved posture. Your physician or physical therapist will provide you with any instructions specific to your needs. While reading these guidelines, remember:  The exercises prescribed by your provider will help you have the flexibility and strength to maintain correct postures.  The correct posture provides the optimal environment for your joints to work. All of your joints have less wear and tear when properly supported by a spine with good posture. This means you will experience a healthier, less painful body.  Correct posture must be practiced with all of your activities, especially prolonged sitting and standing. Correct posture is as important when  doing repetitive  low-stress activities (typing) as it is when doing a single heavy-load activity (lifting). PROLONGED STANDING WHILE SLIGHTLY LEANING FORWARD When completing a task that requires you to lean forward while standing in one place for a long time, place either foot up on a stationary 2- to 4-inch high object to help maintain the best posture. When both feet are on the ground, the low back tends to lose its slight inward curve. If this curve flattens (or becomes too large), then the back and your other joints will experience too much stress, fatigue more quickly, and can cause pain.  RESTING POSITIONS Consider which positions are most painful for you when choosing a resting position. If you have pain with flexion-based activities (sitting, bending, stooping, squatting), choose a position that allows you to rest in a less flexed posture. You would want to avoid curling into a fetal position on your side. If your pain worsens with extension-based activities (prolonged standing, working overhead), avoid resting in an extended position such as sleeping on your stomach. Most people will find more comfort when they rest with their spine in a more neutral position, neither too rounded nor too arched. Lying on a non-sagging bed on your side with a pillow between your knees, or on your back with a pillow under your knees will often provide some relief. Keep in mind, being in any one position for a prolonged period of time, no matter how correct your posture, can still lead to stiffness. WALKING Walk with an upright posture. Your ears, shoulders, and hips should all line up. OFFICE WORK When working at a desk, create an environment that supports good, upright posture. Without extra support, muscles fatigue and lead to excessive strain on joints and other tissues. CHAIR:  A chair should be able to slide under your desk when your back makes contact with the back of the chair. This allows you to work  closely.  The chair's height should allow your eyes to be level with the upper part of your monitor and your hands to be slightly lower than your elbows.  Body position:  Your feet should make contact with the floor. If this is not possible, use a foot rest.  Keep your ears over your shoulders. This will reduce stress on your neck and low back.   This information is not intended to replace advice given to you by your health care provider. Make sure you discuss any questions you have with your health care provider.   Document Released: 10/12/2005 Document Revised: 11/02/2014 Document Reviewed: 01/24/2009 Elsevier Interactive Patient Education [employer].

## 2016-08-24 NOTE — Progress Notes (Signed)
Subjective:    Patient ID: Lawrence Moore, male    DOB: Oct 02, 1951, 65 y.o.   MRN: [id number]  Chief Complaint  Patient presents with  . Establish Care    left shoulder pain, x3 months thought he pulled a muscle went to see his rheumatologist and was given a shot, states it a little better but still bothering him    HPI:  Lawrence Moore is a 65 y.o. 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 and presents today for an office visit to establish care.   1.) Shoulder pain - This is a new problem. Associated symptom of pain located in his left shoulder has been going on for about 3 months. He is right hand dominant. Unaware of trauma or injury. Pain is described as achy and is constant. Does have some weakness compared to the other side. Modifying factors include a cortisone injection about 2 weeks ago by rheumatology which has helped a little but no significant improvement. Does have occasional have tingling in the left hand with previous history of carpal tunnel. Does express some neck discomfort.   2.) Depression - currently maintained on quetiapine and fluoxetine. Reports taking the medication as prescribed and denies adverse side effects or suicidal ideations. Symptoms are generally well controlled with current medication regimen.  3.) GERD - Currently maintained on pantoprazole. Reports taking the medication as prescribed and denies adverse side effects. Symptoms are generally well controlled with the current regimen.   Allergies  Allergen Reactions  . Erythromycin Swelling  . Zocor [Simvastatin] Other (See Comments)    Liver enzyme elevation  . Hydrocodone Other (See Comments)   Unspecified  . Adhesive [Tape] Other (See Comments)    bandaids cause redness - please [place] paper tape  . Ibuprofen Other (See Comments)    abd pain, cramping      Outpatient Medications Prior to Visit  Medication Sig Dispense Refill  . calcium carbonate (TUMS EX) 750 MG chewable tablet Chew 1 tablet by mouth 2 (two) times daily as needed for heartburn.    . celecoxib (CELEBREX) 200 MG capsule Take 400 mg by mouth every morning.     . cholecalciferol (VITAMIN D) 1000 UNITS tablet Take 1,000 Units by mouth daily.    Marland Kitchen desmopressin (DDAVP) 0.1 MG tablet Take 1 tablet by mouth  every morning and 1 and 1/2 tablets by mouth at night 225 tablet 3  . FIBER PO Take 2 capsules by mouth 2 (two) times daily.    Marland Kitchen FLUoxetine (PROZAC) 40 MG capsule Take 40 mg by mouth daily.     . Fluticasone-Salmeterol (ADVAIR) 250-50 MCG/DOSE AEPB Inhale 1 puff into the lungs daily. Mid afternoon    . Ipratropium-Albuterol (COMBIVENT RESPIMAT) 20-100 MCG/ACT AERS respimat Inhale 1 puff into the lungs every 6 (six) hours as needed for wheezing or shortness of breath.    . levothyroxine (SYNTHROID, LEVOTHROID) 150 MCG tablet TAKE 1 TABLET BY MOUTH  DAILY BEFORE BREAKFAST. 90 tablet 3  . predniSONE (DELTASONE) 5 MG tablet Take 1 tablet (5 mg total) by mouth daily. 100 tablet 3  . SUMAtriptan (IMITREX) 100 MG tablet Take 100 mg by mouth every 2 (two) hours as needed for migraine.     Marland Kitchen tiZANidine (ZANAFLEX) 2 MG tablet Take 1 tablet (2 mg  total) by mouth every 8 (eight) hours as needed for muscle spasms. 30 tablet 0  . gabapentin (NEURONTIN) 300 MG capsule Take 600 mg by mouth at bedtime.     . pantoprazole (PROTONIX) 40 MG tablet Take 40 mg by mouth daily.    . pravastatin (PRAVACHOL) 40 MG tablet Take 40 mg by mouth at bedtime.     Marland Kitchen QUEtiapine (SEROQUEL) 25 MG tablet Take 25-50 mg by mouth at bedtime. Based on anxiety level     No facility-administered medications prior to visit.       Past Surgical History:    Procedure Laterality Date  . ABDOMINAL AORTAGRAM N/A 08/31/2012   Procedure: ABDOMINAL AORTAGRAM;  Surgeon: Rosetta Posner, MD;  Location: [facility] CATH LAB;  Service: Cardiovascular;  Laterality: N/A;  . APPENDECTOMY    . BACK SURGERY    . CHOLECYSTECTOMY  2002   Lap. cholecystectomy  . CRANIOTOMY  1964, 1966   Infratentorial exc. of craniopharyngioma  . JOINT REPLACEMENT  2009   Left Knee replacement  . KNEE ARTHROSCOPY  2006   Right Knee  . KNEE ARTHROSCOPY Left 12  . LUMBAR LAMINECTOMY/DECOMPRESSION MICRODISCECTOMY N/A 10/12/2014   Procedure: Lumbar five-Sacral one microdiskectomy;  Surgeon: Ashok Pall, MD;  Location: [facility];  Service: Neurosurgery;  Laterality: N/A;  Lumbar five-Sacral one microdiskectomy  . LUMBAR LAMINECTOMY/DECOMPRESSION MICRODISCECTOMY Right 01/10/2016   Procedure: LUMBAR LAMINECTOMY/DECOMPRESSION MICRODISCECTOMY 1 LEVEL;  Surgeon: Ashok Pall, MD;  Location: [facility];  Service: Neurosurgery;  Laterality: Right;  Right L5S1 microdiskectomy  . [place]      Past Medical History:  Diagnosis Date  . Asthma   . Complication of anesthesia    "woke from anesthia itching" lft knee replacment  . Degenerative joint disease   . Depression   . Diabetes insipidus ([place])   . GERD (gastroesophageal reflux disease)   . Headache    migraines  . Hyperlipidemia   . Hypopituitarism ([place]) 06/28/2014  . Hypothyroidism   . Shortness of breath dyspnea   . Tuberculosis    have had tb vaccince yrs ago  . Tubular adenoma of colon 2004      Review of Systems  Constitutional: Negative for chills and fever.  Respiratory: Negative for chest tightness and shortness of breath.   Cardiovascular: Negative for chest pain, palpitations and leg swelling.  Gastrointestinal: Negative for abdominal pain, constipation, diarrhea, nausea and vomiting.  Musculoskeletal:       Positive for upper back/shoulder pain  Neurological: Positive for weakness and numbness.       Objective:    BP 132/78 (BP Location: Right Arm, Patient Position: Sitting, Cuff Size: Large)   Pulse 75   Temp 98.4 F (36.9 C) (Oral)   Resp 20   Ht 5\' 4"  (1.626 m)   Wt 222 lb (100.7 kg)   SpO2 96%   BMI 38.11 kg/m  Nursing note and vital signs reviewed.  Physical Exam  Constitutional: He is oriented to person, place, and time. He appears well-developed and well-nourished. No distress.  Cardiovascular: Normal rate, regular rhythm, normal heart sounds and intact distal pulses.   Pulmonary/Chest: Effort normal and breath sounds normal.  Musculoskeletal:  Left upper back/shoulder - no obvious deformity, discoloration, or edema. Mild tenderness with no crepitus or deformity along the vertebral border of scapula. No cervical tenderness elicited. Range of motion of the shoulder is within normal limits. There is mild discomfort in full flexion and lateral bending of the neck. Neer's impingement,  Michel Bickers, apprehension, and cervical compression are negative. Distal pulses and sensation are intact and appropriate.  Neurological: He is alert and oriented to person, place, and time.  Skin: Skin is warm and dry.  Psychiatric: He has a normal mood and affect. His behavior is normal. Judgment and thought content normal.       Assessment & Plan:   Problem List Items Addressed This Visit      Digestive   GERD (gastroesophageal reflux disease)    Symptoms appear adequate controlled on current dosage of pantoprazole. Educated regarding food choices for gastroesophageal reflux symptom reduction. Continue current dosage of pantoprazole.      Relevant Medications   pantoprazole (PROTONIX) 40 MG tablet     Other   Upper back pain on left side - Primary    Symptoms and exam consistent with left-sided upper back pain along the vertebral border of the scapula with consideration for trapezius or rhomboid. Does have forward flexed posture and tight pectoralis major which may also be  contributing. Treat conservatively with ice/moist heat, home exercise therapy and continue over-the-counter medications as needed for symptom relief and supportive care. Follow-up if symptoms worsen or fail to improve for imaging and/or physical therapy.      Depression    Depression appears stable with current regimen of fluoxetine and quetiapine. No adverse side effects. Reports sleeping well with no suicidal ideations. Continue current dosage of fluoxetine and quetiapine.       Other Visit Diagnoses   None.      I have changed Mr. Prey gabapentin, pantoprazole, pravastatin, and QUEtiapine. I am also having him maintain his celecoxib, SUMAtriptan, Fluticasone-Salmeterol, FLUoxetine, FIBER PO, cholecalciferol, Ipratropium-Albuterol, calcium carbonate, levothyroxine, predniSONE, desmopressin, and tiZANidine.   Meds ordered this encounter  Medications  . gabapentin (NEURONTIN) 300 MG capsule    Sig: Take 2 capsules (600 mg total) by mouth at bedtime.    Dispense:  180 capsule    Refill:  0    Order Specific Question:   Supervising Provider    Answer:   Pricilla Holm A [id number]  . pantoprazole (PROTONIX) 40 MG tablet    Sig: Take 1 tablet (40 mg total) by mouth daily.    Dispense:  90 tablet    Refill:  0    Order Specific Question:   Supervising Provider    Answer:   Pricilla Holm A [id number]  . pravastatin (PRAVACHOL) 40 MG tablet    Sig: Take 1 tablet (40 mg total) by mouth at bedtime.    Dispense:  90 tablet    Refill:  0    Order Specific Question:   Supervising Provider    Answer:   Pricilla Holm A [id number]  . QUEtiapine (SEROQUEL) 25 MG tablet    Sig: Take 1-2 tablets (25-50 mg total) by mouth at bedtime. Based on anxiety level    Dispense:  180 tablet    Refill:  0    Order Specific Question:   Supervising Provider    Answer:   Pricilla Holm A [id number]     Follow-up: Return in about 3 months (around 11/24/2016), or if symptoms worsen or fail to  improve.  Mauricio Po, FNP

## 2016-08-24 NOTE — Assessment & Plan Note (Signed)
Symptoms appear adequate controlled on current dosage of pantoprazole. Educated regarding food choices for gastroesophageal reflux symptom reduction. Continue current dosage of pantoprazole.

## 2016-08-24 NOTE — Assessment & Plan Note (Signed)
Symptoms and exam consistent with left-sided upper back pain along the vertebral border of the scapula with consideration for trapezius or rhomboid. Does have forward flexed posture and tight pectoralis major which may also be contributing. Treat conservatively with ice/moist heat, home exercise therapy and continue over-the-counter medications as needed for symptom relief and supportive care. Follow-up if symptoms worsen or fail to improve for imaging and/or physical therapy.

## 2016-08-24 NOTE — Assessment & Plan Note (Signed)
Depression appears stable with current regimen of fluoxetine and quetiapine. No adverse side effects. Reports sleeping well with no suicidal ideations. Continue current dosage of fluoxetine and quetiapine.

## 2016-10-12 DIAGNOSIS — M542 Cervicalgia: Secondary | ICD-10-CM | POA: Diagnosis not present

## 2016-10-21 DIAGNOSIS — M542 Cervicalgia: Secondary | ICD-10-CM | POA: Diagnosis not present

## 2016-10-27 DIAGNOSIS — M542 Cervicalgia: Secondary | ICD-10-CM | POA: Diagnosis not present

## 2016-12-02 ENCOUNTER — Other Ambulatory Visit: Payer: Self-pay | Admitting: *Deleted

## 2016-12-02 MED ORDER — PANTOPRAZOLE SODIUM 40 MG PO TBEC: 40.0000 mg | DELAYED_RELEASE_TABLET | Freq: Every day | ORAL | 1 refills | Status: AC

## 2016-12-16 ENCOUNTER — Other Ambulatory Visit: Payer: Self-pay | Admitting: Family

## 2017-01-04 ENCOUNTER — Ambulatory Visit (INDEPENDENT_AMBULATORY_CARE_PROVIDER_SITE_OTHER): Payer: Medicare Other | Admitting: Family

## 2017-01-04 ENCOUNTER — Encounter: Payer: Self-pay | Admitting: Family

## 2017-01-04 ENCOUNTER — Ambulatory Visit (INDEPENDENT_AMBULATORY_CARE_PROVIDER_SITE_OTHER)
Admission: RE | Admit: 2017-01-04 | Discharge: 2017-01-04 | Disposition: A | Discharge status: Home / Self Care | Payer: Medicare Other | Source: Ambulatory Visit | Attending: Family | Admitting: Family

## 2017-01-04 ENCOUNTER — Other Ambulatory Visit (INDEPENDENT_AMBULATORY_CARE_PROVIDER_SITE_OTHER): Payer: Medicare Other

## 2017-01-04 VITALS — BP 120/78 | HR 83 | Temp 98.5°F | Resp 16 | Ht 64.0 in | Wt 222.8 lb

## 2017-01-04 DIAGNOSIS — M7989 Other specified soft tissue disorders: Secondary | ICD-10-CM | POA: Diagnosis not present

## 2017-01-04 DIAGNOSIS — E785 Hyperlipidemia, unspecified: Secondary | ICD-10-CM | POA: Insufficient documentation

## 2017-01-04 DIAGNOSIS — M25531 Pain in right wrist: Secondary | ICD-10-CM | POA: Insufficient documentation

## 2017-01-04 DIAGNOSIS — E782 Mixed hyperlipidemia: Secondary | ICD-10-CM

## 2017-01-04 DIAGNOSIS — F332 Major depressive disorder, recurrent severe without psychotic features: Secondary | ICD-10-CM | POA: Diagnosis not present

## 2017-01-04 LAB — COMPREHENSIVE METABOLIC PANEL
ALBUMIN: 3.7 g/dL (ref 3.5–5.2)
ALK PHOS: 77 U/L (ref 39–117)
ALT: 34 U/L (ref 0–53)
AST: 24 U/L (ref 0–37)
BILIRUBIN TOTAL: 0.6 mg/dL (ref 0.2–1.2)
BUN: 12 mg/dL (ref 6–23)
CALCIUM: 9.2 mg/dL (ref 8.4–10.5)
CO2: 32 mEq/L (ref 19–32)
Chloride: 105 mEq/L (ref 96–112)
Creatinine, Ser: 0.98 mg/dL (ref 0.40–1.50)
GFR: 81.66 mL/min (ref >60.00)
GLUCOSE: 101 mg/dL — AB (ref 70–99)
Potassium: 3.5 mEq/L (ref 3.5–5.1)
Sodium: 144 mEq/L (ref 135–145)
TOTAL PROTEIN: 6 g/dL (ref 6.0–8.3)

## 2017-01-04 LAB — LIPID PANEL
CHOLESTEROL: 145 mg/dL (ref 0–200)
HDL: 49.9 mg/dL (ref >39.00)
LDL Cholesterol: 70 mg/dL (ref 0–99)
NONHDL: 94.98
TRIGLYCERIDES: 125 mg/dL (ref 0.0–149.0)
Total CHOL/HDL Ratio: 3
VLDL: 25 mg/dL (ref 0.0–40.0)

## 2017-01-04 MED ORDER — SUMATRIPTAN SUCCINATE 100 MG PO TABS: ORAL_TABLET | ORAL | 2 refills | Status: AC

## 2017-01-04 MED ORDER — MELOXICAM 15 MG PO TABS: 15.0000 mg | ORAL_TABLET | Freq: Every day | ORAL | 1 refills | Status: DC

## 2017-01-04 MED ORDER — GABAPENTIN 300 MG PO CAPS: 600.0000 mg | ORAL_CAPSULE | Freq: Every day | ORAL | 0 refills | Status: DC

## 2017-01-04 MED ORDER — IPRATROPIUM-ALBUTEROL 20-100 MCG/ACT IN AERS: 1.0000 | INHALATION_SPRAY | Freq: Four times a day (QID) | RESPIRATORY_TRACT | 2 refills | Status: AC | PRN

## 2017-01-04 MED ORDER — PRAVASTATIN SODIUM 40 MG PO TABS: 40.0000 mg | ORAL_TABLET | Freq: Every day | ORAL | 0 refills | Status: DC

## 2017-01-04 MED ORDER — QUETIAPINE FUMARATE 25 MG PO TABS: 25.0000 mg | ORAL_TABLET | Freq: Every day | ORAL | 0 refills | Status: DC

## 2017-01-04 MED ORDER — FLUOXETINE HCL 40 MG PO CAPS: 80.0000 mg | ORAL_CAPSULE | Freq: Every day | ORAL | 1 refills | Status: DC

## 2017-01-04 NOTE — Patient Instructions (Signed)
Thank you for choosing [facility].  SUMMARY AND INSTRUCTIONS:  Ice x 20 minutes every 2 hours and after activity.  Meloxicam daily.  650 mg of Tylenol 3-4 times per day for the next 5-7 days.   They will call to schedule your appointment with psychology.  Increase your Prozac to 80 mg daily.  If thoughts of suicide develop seek emergency care.   Medication:  Your prescription(s) have been submitted to your pharmacy or been printed and provided for you. Please take as directed and contact our office if you believe you are having problem(s) with the medication(s) or have any questions.  Labs:  Please stop by the lab on the lower level of the building for your blood work. Your results will be released to [facility] (or called to you) after review, usually within 72 hours after test completion. If any changes need to be made, you will be notified at that same time.  1.) The lab is open from 7:30am to 5:30 pm Monday-Friday 2.) No appointment is necessary 3.) Fasting (if needed) is 6-8 hours after food and drink; black coffee and water are okay   Follow up:  If your symptoms worsen or fail to improve, please contact our office for further instruction, or in case of emergency go directly to the emergency room at the closest medical facility.

## 2017-01-04 NOTE — Progress Notes (Signed)
Subjective:    Patient ID: Lawrence Moore, male    DOB: 04/09/1951, 66 y.o.   MRN: [id number]  Chief Complaint  Patient presents with  . Depression    does not feel that medication is helping, lays in bed alot    HPI:   Lawrence Moore is a 66 y.o. 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 and presents today for an office visit.  1.) Depression - Currently maintained on fluoxetine. Reports taking the fluoxetine as prescribed and denies adverse side effects. Notes that his symptoms have been worsening over the past 3 months with increased levels of sleep and lack of caring. Denies suicidal ideations. Was also spending additional money that he didn't have. Notes he has been on the fluoxentine for up to the last 14 years.   2.) Wrist - This is a new problem. Associated symptom of pain located in his right wrist has been going on for about 3 days. Previous history of carpal tunnel. Indicates he may have had a fall forward on an outstretched hand. Pain is described as sharp on occasion when he moves the hand the wrong way. Described as inside the joint. Modifying factors include ice and compression which did not help very much. Denies any fevers, chills, or weight loss. No numbness or tingling.  3.) Hyperlipidemia - currently maintained on pravastatin. Reports taking medication as prescribed and denies adverse side effects or myalgias. Denies chest pain, shortness of breath, or paroxysmal nocturnal dyspnea. Due for lipid profile.    Allergies  Allergen Reactions  . Erythromycin Swelling  . Zocor [Simvastatin] Other (See Comments)    Liver enzyme elevation  . Hydrocodone Other (See Comments)   Unspecified  . Adhesive [Tape] Other (See Comments)    bandaids cause redness - please [place] paper tape  . Ibuprofen Other (See Comments)    abd pain, cramping      Outpatient Medications Prior to Visit  Medication Sig Dispense Refill  . cholecalciferol (VITAMIN D) 1000 UNITS tablet Take 1,000 Units by mouth daily.    Marland Kitchen desmopressin (DDAVP) 0.1 MG tablet Take 1 tablet by mouth  every morning and 1 and 1/2 tablets by mouth at night 225 tablet 3  . FIBER PO Take 2 capsules by mouth 2 (two) times daily.    Marland Kitchen levothyroxine (SYNTHROID, LEVOTHROID) 150 MCG tablet TAKE 1 TABLET BY MOUTH  DAILY BEFORE BREAKFAST. 90 tablet 3  . pantoprazole (PROTONIX) 40 MG tablet Take 1 tablet (40 mg total) by mouth daily. 90 tablet 1  . predniSONE (DELTASONE) 5 MG tablet Take 1 tablet (5 mg total) by mouth daily. 100 tablet 3  . calcium carbonate (TUMS EX) 750 MG chewable tablet Chew 1 tablet by mouth 2 (two) times daily as needed for heartburn.    . celecoxib (CELEBREX) 200 MG capsule Take 400 mg by mouth every morning.     Marland Kitchen FLUoxetine (PROZAC) 40 MG capsule Take 40 mg by mouth daily.     . Fluticasone-Salmeterol (ADVAIR) 250-50 MCG/DOSE AEPB Inhale 1 puff into the lungs daily. Mid afternoon    . gabapentin (NEURONTIN) 300 MG capsule Take 2 capsules (600 mg total) by mouth at bedtime. 180 capsule 0  . Ipratropium-Albuterol (COMBIVENT RESPIMAT) 20-100 MCG/ACT AERS respimat Inhale 1 puff into the lungs every  6 (six) hours as needed for wheezing or shortness of breath.    . pravastatin (PRAVACHOL) 40 MG tablet Take 1 tablet (40 mg total) by mouth at bedtime. 90 tablet 0  . QUEtiapine (SEROQUEL) 25 MG tablet Take 1-2 tablets (25-50 mg total) by mouth at bedtime. Based on anxiety level 180 tablet 0  . SUMAtriptan (IMITREX) 100 MG tablet Take 100 mg by mouth every 2 (two) hours as needed for migraine.     Marland Kitchen tiZANidine (ZANAFLEX) 2 MG tablet Take 1 tablet (2 mg total) by mouth every 8 (eight) hours as needed for muscle  spasms. 30 tablet 0   No facility-administered medications prior to visit.       Past Surgical History:  Procedure Laterality Date  . ABDOMINAL AORTAGRAM N/A 08/31/2012   Procedure: ABDOMINAL AORTAGRAM;  Surgeon: Rosetta Posner, MD;  Location: [facility] CATH LAB;  Service: Cardiovascular;  Laterality: N/A;  . APPENDECTOMY    . BACK SURGERY    . CHOLECYSTECTOMY  2002   Lap. cholecystectomy  . CRANIOTOMY  1964, 1966   Infratentorial exc. of craniopharyngioma  . JOINT REPLACEMENT  2009   Left Knee replacement  . KNEE ARTHROSCOPY  2006   Right Knee  . KNEE ARTHROSCOPY Left 12  . LUMBAR LAMINECTOMY/DECOMPRESSION MICRODISCECTOMY N/A 10/12/2014   Procedure: Lumbar five-Sacral one microdiskectomy;  Surgeon: Ashok Pall, MD;  Location: [facility];  Service: Neurosurgery;  Laterality: N/A;  Lumbar five-Sacral one microdiskectomy  . LUMBAR LAMINECTOMY/DECOMPRESSION MICRODISCECTOMY Right 01/10/2016   Procedure: LUMBAR LAMINECTOMY/DECOMPRESSION MICRODISCECTOMY 1 LEVEL;  Surgeon: Ashok Pall, MD;  Location: [facility];  Service: Neurosurgery;  Laterality: Right;  Right L5S1 microdiskectomy  . [place]      Past Medical History:  Diagnosis Date  . Asthma   . Complication of anesthesia    "woke from anesthia itching" lft knee replacment  . Degenerative joint disease   . Depression   . Diabetes insipidus ([place])   . GERD (gastroesophageal reflux disease)   . Headache    migraines  . Hyperlipidemia   . Hypopituitarism ([place]) 06/28/2014  . Hypothyroidism   . Shortness of breath dyspnea   . Tuberculosis    have had tb vaccince yrs ago  . Tubular adenoma of colon 2004      Review of Systems  Constitutional: Negative for chills and fever.  Eyes:       Negative for changes in vision  Respiratory: Negative for cough, chest tightness and wheezing.   Cardiovascular: Negative for chest pain, palpitations and leg swelling.  Musculoskeletal:       Positive for right wrist pain.     Neurological: Negative for dizziness, weakness, light-headedness and numbness.  Psychiatric/Behavioral: Positive for dysphoric mood and sleep disturbance. Negative for agitation, behavioral problems, confusion, self-injury and suicidal ideas. The patient is not hyperactive.       Objective:    BP 120/78 (BP Location: Left Arm, Patient Position: Sitting, Cuff Size: Large)   Pulse 83   Temp 98.5 F (36.9 C) (Oral)   Resp 16   Ht 5\' 4"  (1.626 m)   Wt 222 lb 12.8 oz (101.1 kg)   SpO2 95%   BMI 38.24 kg/m  Nursing note and vital signs reviewed.  Physical Exam  Constitutional: He is oriented to person, place, and time. He appears well-developed and well-nourished. No distress.  Cardiovascular: Normal rate, regular rhythm, normal heart sounds and intact distal pulses.   Pulmonary/Chest: Effort normal and breath sounds normal.  Musculoskeletal:  Right wrist - no obvious deformity or discoloration with mild/moderate edema over the proximal wrist. There is tenderness over the distal radial ulnar joint as well as some Lister's tubericle and the anatomical snuff box. Range of motion is slightly limited in flexion/extension secondary to discomfort. Pulses and sensation are intact and appropriate. Negative valgus/varus stress test.  Neurological: He is alert and oriented to person, place, and time.  Skin: Skin is warm and dry.  Psychiatric: His behavior is normal. Judgment and thought content normal. His mood appears not anxious. He exhibits a depressed mood.       Assessment & Plan:   Problem List Items Addressed This Visit      Other   Depression - Primary    Depression with increased symptoms with no potential triggers noted. Denies sucidial ideations. Increase Prozac. Refer to psychology for counseling. Question possible Bipolar 2 secondary to description. Advised to seek care if ideations of suicide develop or depression worsen. Continue to monitor.       Relevant Medications    FLUoxetine (PROZAC) 40 MG capsule   Acute pain of right wrist    Right wrist with potential [place] injury leading to sprain/strain with concern for possible fracture. Obtain x-ray. Start meloxicam. Cock up wrist brace applied with improved pain levels. Recommend icing regimen and Tylenol pending x-ray results.       Relevant Orders   DG Wrist Complete Right   Hyperlipidemia    Currently maintained on pravastatin with no adverse side effects. Obtain lipid profile and CMET. Continue current dosage of pravastatin pending lipid profile results.      Relevant Medications   pravastatin (PRAVACHOL) 40 MG tablet   Other Relevant Orders   Lipid Profile   Comprehensive metabolic panel       I have discontinued Mr. Rosenwald celecoxib, Fluticasone-Salmeterol, FLUoxetine, calcium carbonate, and tiZANidine. I have also changed his SUMAtriptan. Additionally, I am having him start on FLUoxetine and meloxicam. Lastly, I am having him maintain his FIBER PO, cholecalciferol, levothyroxine, predniSONE, desmopressin, pantoprazole, QUEtiapine, pravastatin, gabapentin, and Ipratropium-Albuterol.   Meds ordered this encounter  Medications  . FLUoxetine (PROZAC) 40 MG capsule    Sig: Take 2 capsules (80 mg total) by mouth daily.    Dispense:  60 capsule    Refill:  1    Order Specific Question:   Supervising Provider    Answer:   Pricilla Holm A [[id number]]  . SUMAtriptan (IMITREX) 100 MG tablet    Sig: Take 1 tablet by mouth at the onset of a headache and may repeat 1 hour later.    Dispense:  10 tablet    Refill:  2    Order Specific Question:   Supervising Provider    Answer:   Pricilla Holm A [[id number]]  . QUEtiapine (SEROQUEL) 25 MG tablet    Sig: Take 1-2 tablets (25-50 mg total) by mouth at bedtime. Based on anxiety level    Dispense:  180 tablet    Refill:  0    Order Specific Question:   Supervising Provider    Answer:   Pricilla Holm A [[id number]]  . pravastatin (PRAVACHOL) 40 MG  tablet    Sig: Take 1 tablet (40 mg total) by mouth at bedtime.    Dispense:  90 tablet    Refill:  0    Order Specific Question:   Supervising Provider    Answer:   Pricilla Holm A [[id number]]  . gabapentin (NEURONTIN) 300 MG capsule  Sig: Take 2 capsules (600 mg total) by mouth at bedtime.    Dispense:  180 capsule    Refill:  0    Order Specific Question:   Supervising Provider    Answer:   Pricilla Holm A [[id number]]  . Ipratropium-Albuterol (COMBIVENT RESPIMAT) 20-100 MCG/ACT AERS respimat    Sig: Inhale 1 puff into the lungs every 6 (six) hours as needed for wheezing or shortness of breath.    Dispense:  4 g    Refill:  2    Order Specific Question:   Supervising Provider    Answer:   Pricilla Holm A [[id number]]  . meloxicam (MOBIC) 15 MG tablet    Sig: Take 1 tablet (15 mg total) by mouth daily.    Dispense:  30 tablet    Refill:  1    Order Specific Question:   Supervising Provider    Answer:   Pricilla Holm A [[id number]]     Follow-up: Return in about 1 month (around 02/04/2017), or if symptoms worsen or fail to improve.  Mauricio Po, FNP

## 2017-01-04 NOTE — Assessment & Plan Note (Signed)
Right wrist with potential [place] injury leading to sprain/strain with concern for possible fracture. Obtain x-ray. Start meloxicam. Cock up wrist brace applied with improved pain levels. Recommend icing regimen and Tylenol pending x-ray results.

## 2017-01-04 NOTE — Assessment & Plan Note (Signed)
Depression with increased symptoms with no potential triggers noted. Denies sucidial ideations. Increase Prozac. Refer to psychology for counseling. Question possible Bipolar 2 secondary to description. Advised to seek care if ideations of suicide develop or depression worsen. Continue to monitor.

## 2017-01-04 NOTE — Assessment & Plan Note (Signed)
Currently maintained on pravastatin with no adverse side effects. Obtain lipid profile and CMET. Continue current dosage of pravastatin pending lipid profile results.

## 2017-01-12 DIAGNOSIS — H2513 Age-related nuclear cataract, bilateral: Secondary | ICD-10-CM | POA: Diagnosis not present

## 2017-01-12 DIAGNOSIS — H43391 Other vitreous opacities, right eye: Secondary | ICD-10-CM | POA: Diagnosis not present

## 2017-01-12 DIAGNOSIS — H43812 Vitreous degeneration, left eye: Secondary | ICD-10-CM | POA: Diagnosis not present

## 2017-01-12 DIAGNOSIS — H40013 Open angle with borderline findings, low risk, bilateral: Secondary | ICD-10-CM | POA: Diagnosis not present

## 2017-01-12 DIAGNOSIS — H47293 Other optic atrophy, bilateral: Secondary | ICD-10-CM | POA: Diagnosis not present

## 2017-01-19 ENCOUNTER — Telehealth: Payer: Self-pay | Admitting: Family

## 2017-01-19 DIAGNOSIS — F32A Depression, unspecified: Secondary | ICD-10-CM

## 2017-01-19 DIAGNOSIS — F329 Major depressive disorder, single episode, unspecified: Secondary | ICD-10-CM

## 2017-01-19 NOTE — Telephone Encounter (Signed)
Pt called and said that when he was here to see Marya Amsler on 01/04/17 he was under the impression that he was going to be referred to a psychiatrist. I did not see any referall orders. Please advise.  Thanks [employer]

## 2017-01-20 NOTE — Telephone Encounter (Signed)
Referral placed.

## 2017-02-09 DIAGNOSIS — M5117 Intervertebral disc disorders with radiculopathy, lumbosacral region: Secondary | ICD-10-CM | POA: Diagnosis not present

## 2017-02-10 ENCOUNTER — Other Ambulatory Visit: Payer: Self-pay | Admitting: Neurosurgery

## 2017-02-10 DIAGNOSIS — M810 Age-related osteoporosis without current pathological fracture: Secondary | ICD-10-CM | POA: Diagnosis not present

## 2017-02-10 DIAGNOSIS — M549 Dorsalgia, unspecified: Secondary | ICD-10-CM | POA: Diagnosis not present

## 2017-02-10 DIAGNOSIS — M5117 Intervertebral disc disorders with radiculopathy, lumbosacral region: Secondary | ICD-10-CM

## 2017-02-10 DIAGNOSIS — M150 Primary generalized (osteo)arthritis: Secondary | ICD-10-CM | POA: Diagnosis not present

## 2017-02-10 DIAGNOSIS — M7070 Other bursitis of hip, unspecified hip: Secondary | ICD-10-CM | POA: Diagnosis not present

## 2017-02-11 ENCOUNTER — Ambulatory Visit
Admission: RE | Admit: 2017-02-11 | Discharge: 2017-02-11 | Disposition: A | Discharge status: Home / Self Care | Payer: Medicare Other | Source: Ambulatory Visit | Attending: Neurosurgery | Admitting: Neurosurgery

## 2017-02-11 DIAGNOSIS — M48061 Spinal stenosis, lumbar region without neurogenic claudication: Secondary | ICD-10-CM | POA: Diagnosis not present

## 2017-02-11 DIAGNOSIS — M5117 Intervertebral disc disorders with radiculopathy, lumbosacral region: Secondary | ICD-10-CM

## 2017-02-11 MED ORDER — GADOBENATE DIMEGLUMINE 529 MG/ML IV SOLN
20.0000 mL | Freq: Once | INTRAVENOUS | Status: AC | PRN
  Administered 2017-02-11: 20 mL via INTRAVENOUS

## 2017-02-16 ENCOUNTER — Other Ambulatory Visit: Payer: Self-pay | Admitting: Neurosurgery

## 2017-02-16 DIAGNOSIS — M5117 Intervertebral disc disorders with radiculopathy, lumbosacral region: Secondary | ICD-10-CM | POA: Diagnosis not present

## 2017-02-22 ENCOUNTER — Encounter (HOSPITAL_COMMUNITY): Payer: Self-pay

## 2017-02-22 ENCOUNTER — Other Ambulatory Visit: Payer: Self-pay | Admitting: Family

## 2017-02-22 ENCOUNTER — Encounter (HOSPITAL_COMMUNITY)
Admission: RE | Admit: 2017-02-22 | Discharge: 2017-02-22 | Disposition: A | Discharge status: Home / Self Care | Payer: Medicare Other | Source: Ambulatory Visit | Attending: Neurosurgery | Admitting: Neurosurgery

## 2017-02-22 HISTORY — DX: Legal blindness, as defined in USA: H54.8

## 2017-02-22 HISTORY — DX: Anxiety disorder, unspecified: F41.9

## 2017-02-22 LAB — CBC
HCT: 41.6 % (ref 39.0–52.0)
Hemoglobin: 14.3 g/dL (ref 13.0–17.0)
MCH: 30.4 pg (ref 26.0–34.0)
MCHC: 34.4 g/dL (ref 30.0–36.0)
MCV: 88.5 fL (ref 78.0–100.0)
PLATELETS: 161 10*3/uL (ref 150–400)
RBC: 4.7 MIL/uL (ref 4.22–5.81)
RDW: 13 % (ref 11.5–15.5)
WBC: 8.5 10*3/uL (ref 4.0–10.5)

## 2017-02-22 LAB — COMPREHENSIVE METABOLIC PANEL
ALK PHOS: 78 U/L (ref 38–126)
ALT: 42 U/L (ref 17–63)
ANION GAP: 6 (ref 5–15)
AST: 37 U/L (ref 15–41)
Albumin: 3.7 g/dL (ref 3.5–5.0)
BUN: 10 mg/dL (ref 6–20)
CALCIUM: 9.1 mg/dL (ref 8.9–10.3)
CHLORIDE: 105 mmol/L (ref 101–111)
CO2: 29 mmol/L (ref 22–32)
CREATININE: 1.1 mg/dL (ref 0.61–1.24)
GFR calc non Af Amer: >60 mL/min (ref >60)
Glucose, Bld: 110 mg/dL — ABNORMAL HIGH (ref 65–99)
Potassium: 3.7 mmol/L (ref 3.5–5.1)
SODIUM: 140 mmol/L (ref 135–145)
Total Bilirubin: 1.1 mg/dL (ref 0.3–1.2)
Total Protein: 6 g/dL — ABNORMAL LOW (ref 6.5–8.1)

## 2017-02-22 LAB — SURGICAL PCR SCREEN
MRSA, PCR: NEGATIVE
Staphylococcus aureus: NEGATIVE

## 2017-02-22 NOTE — Pre-Procedure Instructions (Addendum)
    Lawrence Moore  02/22/2017      [facility] [place], [place] [address] [place] [address] Phone: [phone] Fax: [phone]  [place], [place] [facility] [address] [place] [address] [place] [address] Phone: [phone] Fax: [phone]    Your procedure is scheduled on Wednesday, May 2nd   Report to [facility] Admitting at 10:00 AM             (posted surgery time 12:03 - 2:12 pm) .  Call this number if you have problems the MORNING of surgery:  [phone], for other questions call [phone] Mon-Fri from 8-4:30pm)   Remember:  Do not eat food or drink liquids after midnight Tuesday.   Take these medicines the morning of surgery with A SIP OF WATER : DDAVP, Prozac, Levothyroxine, Oxycodone, Protonix.  Please use Combivent that morning.              4-5 days prior to surgery, STOP TAKING any Vitamins, Herbal Supplements, Anti-inflammatories   Do not wear jewelry - no rings or watches.  Do not wear lotions, colognes or deoderant.             Men may shave face and neck.   Do not bring valuables to the hospital.  [facility] is not responsible for any belongings or valuables.  Contacts, dentures or bridgework may not be worn into surgery.  Leave your suitcase in the car.  After surgery it may be brought to your room. For patients admitted to the hospital, discharge time will be determined by your treatment team.   Please read over the following fact sheets that you were given. Pain Booklet, MRSA Information and Surgical Site Infection Prevention

## 2017-02-22 NOTE — Progress Notes (Signed)
   02/22/17 1312  OBSTRUCTIVE SLEEP APNEA  Have you ever been diagnosed with sleep apnea through a sleep study? No  Do you snore loudly (loud enough to be heard through closed doors)?  1  Do you often feel tired, fatigued, or sleepy during the daytime (such as falling asleep during driving or talking to someone)? 0  Has anyone observed you stop breathing during your sleep? 0  Do you have, or are you being treated for high blood pressure? 1  BMI more than 35 kg/m2? 0  Age > 50 (1-yes) 1  Neck circumference greater than:Male 16 inches or larger, Male 17inches or larger? 1  Male Gender (Yes=1) 1  Obstructive Sleep Apnea Score 5  Score 5 or greater  Results sent to PCP

## 2017-02-22 NOTE — Progress Notes (Addendum)
Pcp is NP G. Bunn 11/2016 Endocrinologist  Dr. Benjiman Core.  LOV 06/2016 Have instructed patient to take both DDAVP & prednisone (along with other meds) the morning of surgery. Have left message at Dr. Arman Filter office that this patient is having surgery. Denies any cardiac issues, sees no cardio.

## 2017-02-23 ENCOUNTER — Encounter (INDEPENDENT_AMBULATORY_CARE_PROVIDER_SITE_OTHER): Payer: Self-pay

## 2017-02-23 ENCOUNTER — Ambulatory Visit (INDEPENDENT_AMBULATORY_CARE_PROVIDER_SITE_OTHER): Payer: Medicare Other | Admitting: Psychiatry

## 2017-02-23 ENCOUNTER — Encounter (HOSPITAL_COMMUNITY): Payer: Self-pay | Admitting: Psychiatry

## 2017-02-23 VITALS — BP 142/84 | HR 69 | Ht 64.0 in | Wt 218.0 lb

## 2017-02-23 DIAGNOSIS — Z79891 Long term (current) use of opiate analgesic: Secondary | ICD-10-CM

## 2017-02-23 DIAGNOSIS — Z818 Family history of other mental and behavioral disorders: Secondary | ICD-10-CM

## 2017-02-23 DIAGNOSIS — Z810 Family history of intellectual disabilities: Secondary | ICD-10-CM | POA: Diagnosis not present

## 2017-02-23 DIAGNOSIS — Z79899 Other long term (current) drug therapy: Secondary | ICD-10-CM | POA: Diagnosis not present

## 2017-02-23 DIAGNOSIS — Z87891 Personal history of nicotine dependence: Secondary | ICD-10-CM | POA: Diagnosis not present

## 2017-02-23 DIAGNOSIS — F331 Major depressive disorder, recurrent, moderate: Secondary | ICD-10-CM

## 2017-02-23 MED ORDER — DULOXETINE HCL 30 MG PO CPEP: ORAL_CAPSULE | ORAL | 0 refills | Status: AC

## 2017-02-23 NOTE — Progress Notes (Signed)
Psychiatric Initial Adult Assessment   Patient Identification: Lawrence Moore MRN:  [id number]  Date of Evaluation:  02/23/2017 Referral Source: Primary care physician Chief Complaint:   Chief Complaint    Depression; Establish Care     Visit Diagnosis:    ICD-9-CM ICD-10-CM   1. Moderate episode of recurrent major depressive disorder (HCC) 296.32 F33.1 DULoxetine (CYMBALTA) 30 MG capsule    History of Present Illness:  Patient is 66 year old Caucasian, unemployed single man who is referred from his primary care physician for the management of depression and anxiety symptoms.  Patient has long history of depression but lately he feels his symptoms are getting worse.  For past few months he's been more irritable, isolated, withdrawn, lack of energy, lack of motivation and poor sleep.  In the past 3 weeks he feel symptoms started to get worst.  He fell 3 weeks ago and he has excruciating back pain.  Patient has history of back pain but symptoms precipitated after the fall.  He is having back surgery tomorrow.  Patient appears in a lot of pain and he is restless.  Patient admitted lately feeling hopeless helpless because he does not walk as much and he feels burden .  He is taking oxycodone which was started recently and that also making him tired, poor attention, poor concentration and foggy.  He admitted lately feeling very irritable and frustrated .  Though he denies any suicidal thoughts or homicidal thought but endorsed anhedonia, withdrawn and lack of interest.  Patient remembered that he used to enjoy carving woods but lately due to pain and depression he has not done that.  He is only sleeping 2-3 hours.  During this session he was keep changing his posture due to the pain.  He's been taking Prozac and Seroquel since 2005 when [facility] was [facility]  Recently his Prozac dose was increased to 80 mg by his primary care physician but he has not seen any improvement.   Patient endorse history of manic-like symptoms but he has not experiencing these symptoms more than 10 years.  He has noticed gradually and slowly he is more depressed and does not have energy to do things.  Patient denies any nightmares, flashback, OCD , PTSD or any panic attacks.  He is not seeing therapists but like to see someone for counseling.  Patient lives with his sister.  He never married.  He has no children.  Beside back pain patient has history of asthma, degenerative joint disease, diabetes insipidus, GERD, headache, hyperlipidemia, hypothyroidism, hypopituitarism .  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  Patient denies drinking alcohol or using any illegal substances.   Associated Signs/Symptoms: Depression Symptoms:  depressed mood, anhedonia, insomnia, psychomotor agitation, feelings of worthlessness/guilt, difficulty concentrating, hopelessness, anxiety, (Hypo) Manic Symptoms:  Distractibility, Irritable Mood, Anxiety Symptoms:  Social Anxiety, Psychotic Symptoms:  No psychotic symptoms PTSD Symptoms: NA  Past Psychiatric History: Patient remembers seeing psychiatrist when he was 66 years old.  At that time he was living with his parents in [place].  He remember that he was not doing good functionally and peers were concerned.  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  Patient hospitalized in 2005 at [facility] due to severe depression and having suicidal thoughts.  He was about to cut his throat carving knife.  Patient was discharged on Lamictal, Tegretol, Prozac, Seroquel, Ambien.  Patient saw psychiatrist at [facility] for 2 years.  After that his medicines were  continued by his primary care physician.  He's been taking Seroquel and Prozac since then.  Patient endorse history of irritability, mood swing, anger and highs and lows.  However he denies any history of paranoia, hallucination, OCD, EST or any panic attacks.  Previous Psychotropic  Medications: Yes   Substance Abuse History in the last 12 months:  No.  Consequences of Substance Abuse: Negative  Past Medical History:  Past Medical History:  Diagnosis Date  . Anxiety   . Asthma   . Complication of anesthesia    "woke from anesthia itching" lft knee replacment  . Degenerative joint disease   . Depression   . Diabetes insipidus ([place])   . GERD (gastroesophageal reflux disease)   . Headache    migraines  . Hyperlipidemia   . Hypopituitarism ([place]) 06/28/2014  . Hypothyroidism   . 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 Shortness of breath dyspnea   . Tubular adenoma of colon 2004    Past Surgical History:  Procedure Laterality Date  . ABDOMINAL AORTAGRAM N/A 08/31/2012   Procedure: ABDOMINAL AORTAGRAM;  Surgeon: Rosetta Posner, MD;  Location: [facility] CATH LAB;  Service: Cardiovascular;  Laterality: N/A;  . APPENDECTOMY    . BACK SURGERY    . CHOLECYSTECTOMY  2002   Lap. cholecystectomy  . CRANIOTOMY  1964, 1966   Infratentorial exc. of craniopharyngioma  . JOINT REPLACEMENT  2009   Left Knee replacement  . KNEE ARTHROSCOPY  2006   Right Knee  . KNEE ARTHROSCOPY Left 12  . LUMBAR LAMINECTOMY/DECOMPRESSION MICRODISCECTOMY N/A 10/12/2014   Procedure: Lumbar five-Sacral one microdiskectomy;  Surgeon: Ashok Pall, MD;  Location: [facility];  Service: Neurosurgery;  Laterality: N/A;  Lumbar five-Sacral one microdiskectomy  . LUMBAR LAMINECTOMY/DECOMPRESSION MICRODISCECTOMY Right 01/10/2016   Procedure: LUMBAR LAMINECTOMY/DECOMPRESSION MICRODISCECTOMY 1 LEVEL;  Surgeon: Ashok Pall, MD;  Location: [facility] NEURO ORS;  Service: Neurosurgery;  Laterality: Right;  Right L5S1 microdiskectomy  . SPINE SURGERY  1998    Family Psychiatric History: Patient endorse sister and mother has depression and OCD.  Family History:  Family History  Problem Relation Age of Onset  . Depression Mother   . Hyperlipidemia Mother   . Other Mother     VARICOSE VEINS  . Deep vein  thrombosis Father   . Prostate cancer Father   . Depression Sister   . Hyperlipidemia Sister   . Alzheimer's disease Maternal Grandmother   . Healthy Maternal Grandfather   . Healthy Paternal Grandmother   . Stroke Paternal Grandfather     Social History:   Social History   Social History  . Marital status: Single    Spouse name: N/A  . Number of children: 0  . Years of education: 14   Occupational History  . Retired    Social History Main Topics  . Smoking status: Former Smoker    Packs/day: 3.00    Years: 33.00    Types: Cigarettes    Quit date: 01/24/2005  . Smokeless tobacco: Never Used  . Alcohol use No     Comment: 2-3 times a year  . Drug use: No  . Sexual activity: Not Currently   Other Topics Concern  . None   Social History Narrative   Fun: Carve steaks    Additional Social History: Patient born in [place].  Then he moved to [place] with his parents where he lives 7 years.  Patient moved to [place] with his family after  his father find a very good job in [place].  His father is from [place].  Patient never married.  He had 1 relationship in the past which did not work very well.  Patient has no children.  In 2000 patient and his sister moved to [place].  Patient mentioned Sr. is very supportive.  In 1997 patient started to have back pain and since then he has multiple surgeries for his back.  Patient could not work since 1997.  Allergies:   Allergies  Allergen Reactions  . Erythromycin Swelling  . Zocor [Simvastatin] Other (See Comments)    Liver enzyme elevation  . Hydrocodone Other (See Comments)    Unspecified  . Adhesive [Tape] Other (See Comments)    bandaids cause redness - please [place] paper tape  . Ibuprofen Other (See Comments)    abd pain, cramping    Metabolic Disorder Labs: Recent Results (from the past 2160 hour(s))  Lipid Profile     Status: None   Collection Time: 01/04/17 11:25 AM  Result Value Ref Range    Cholesterol 145 0 - 200 mg/dL    Comment: ATP III Classification       Desirable:  < 200 mg/dL               Borderline High:  200 - 239 mg/dL          High:  > = 240 mg/dL   Triglycerides 125.0 0.0 - 149.0 mg/dL    Comment: Normal:  <150 mg/dLBorderline High:  150 - 199 mg/dL   HDL 49.90 >39.00 mg/dL   VLDL 25.0 0.0 - 40.0 mg/dL   LDL Cholesterol 70 0 - 99 mg/dL   Total CHOL/HDL Ratio 3     Comment:                Men          Women1/2 Average Risk     3.4          3.3Average Risk          5.0          4.42X Average Risk          9.6          7.13X Average Risk          15.0          11.0                       NonHDL 94.98     Comment: NOTE:  Non-HDL goal should be 30 mg/dL higher than patient's LDL goal (i.e. LDL goal of < 70 mg/dL, would have non-HDL goal of < 100 mg/dL)  Comprehensive metabolic panel     Status: Abnormal   Collection Time: 01/04/17 11:25 AM  Result Value Ref Range   Sodium 144 135 - 145 mEq/L   Potassium 3.5 3.5 - 5.1 mEq/L   Chloride 105 96 - 112 mEq/L   CO2 32 19 - 32 mEq/L   Glucose, Bld 101 (H) 70 - 99 mg/dL   BUN 12 6 - 23 mg/dL   Creatinine, Ser 0.98 0.40 - 1.50 mg/dL   Total Bilirubin 0.6 0.2 - 1.2 mg/dL   Alkaline Phosphatase 77 39 - 117 U/L   AST 24 0 - 37 U/L   ALT 34 0 - 53 U/L   Total Protein 6.0 6.0 - 8.3 g/dL   Albumin 3.7 3.5 - 5.2 g/dL  Calcium 9.2 8.4 - 10.5 mg/dL   GFR 81.66 >60.00 mL/min  Surgical pcr screen     Status: None   Collection Time: 02/22/17  1:36 PM  Result Value Ref Range   MRSA, PCR NEGATIVE NEGATIVE   Staphylococcus aureus NEGATIVE NEGATIVE    Comment:        The Xpert SA Assay (FDA approved for NASAL specimens in patients over 80 years of age), is one component of a comprehensive surveillance program.  Test performance has been validated by [facility] for patients greater than or equal to 54 year old. It is not intended to diagnose infection nor to guide or monitor treatment.   CBC     Status: None   Collection  Time: 02/22/17  1:37 PM  Result Value Ref Range   WBC 8.5 4.0 - 10.5 K/uL   RBC 4.70 4.22 - 5.81 MIL/uL   Hemoglobin 14.3 13.0 - 17.0 g/dL   HCT 41.6 39.0 - 52.0 %   MCV 88.5 78.0 - 100.0 fL   MCH 30.4 26.0 - 34.0 pg   MCHC 34.4 30.0 - 36.0 g/dL   RDW 13.0 11.5 - 15.5 %   Platelets 161 150 - 400 K/uL  Comprehensive metabolic panel     Status: Abnormal   Collection Time: 02/22/17  1:37 PM  Result Value Ref Range   Sodium 140 135 - 145 mmol/L   Potassium 3.7 3.5 - 5.1 mmol/L    Comment: HEMOLYSIS AT THIS LEVEL MAY AFFECT RESULT   Chloride 105 101 - 111 mmol/L   CO2 29 22 - 32 mmol/L   Glucose, Bld 110 (H) 65 - 99 mg/dL   BUN 10 6 - 20 mg/dL   Creatinine, Ser 1.10 0.61 - 1.24 mg/dL   Calcium 9.1 8.9 - 10.3 mg/dL   Total Protein 6.0 (L) 6.5 - 8.1 g/dL   Albumin 3.7 3.5 - 5.0 g/dL   AST 37 15 - 41 U/L   ALT 42 17 - 63 U/L   Alkaline Phosphatase 78 38 - 126 U/L   Total Bilirubin 1.1 0.3 - 1.2 mg/dL   GFR calc non Af Amer >60 >60 mL/min   GFR calc Af Amer >60 >60 mL/min    Comment: (NOTE) The eGFR has been calculated using the CKD EPI equation. This calculation has not been validated in all clinical situations. eGFR's persistently <60 mL/min signify possible Chronic Kidney Disease.    Anion gap 6 5 - 15   No results found for: HGBA1C, MPG No results found for: PROLACTIN Lab Results  Component Value Date   CHOL 145 01/04/2017   TRIG 125.0 01/04/2017   HDL 49.90 01/04/2017   CHOLHDL 3 01/04/2017   VLDL 25.0 01/04/2017   LDLCALC 70 01/04/2017     Current Medications: Current Outpatient Prescriptions  Medication Sig Dispense Refill  . celecoxib (CELEBREX) 200 MG capsule Take 200 mg by mouth 2 (two) times daily.    . cholecalciferol (VITAMIN D) 1000 UNITS tablet Take 1,000 Units by mouth daily.    Marland Kitchen desmopressin (DDAVP) 0.1 MG tablet Take 1 tablet by mouth  every morning and 1 and 1/2 tablets by mouth at night 225 tablet 3  . gabapentin (NEURONTIN) 300 MG capsule TAKE 2  CAPSULES BY MOUTH AT BEDTIME 180 capsule 1  . Ipratropium-Albuterol (COMBIVENT RESPIMAT) 20-100 MCG/ACT AERS respimat Inhale 1 puff into the lungs every 6 (six) hours as needed for wheezing or shortness of breath. 4 g 2  . levothyroxine (SYNTHROID,  LEVOTHROID) 150 MCG tablet TAKE 1 TABLET BY MOUTH  DAILY BEFORE BREAKFAST. 90 tablet 3  . oxyCODONE (OXY IR/ROXICODONE) 5 MG immediate release tablet Take 5 mg by mouth every 6 (six) hours as needed for severe pain.    . pantoprazole (PROTONIX) 40 MG tablet Take 1 tablet (40 mg total) by mouth daily. 90 tablet 1  . pravastatin (PRAVACHOL) 40 MG tablet TAKE 1 TABLET BY MOUTH AT  BEDTIME 90 tablet 1  . predniSONE (DELTASONE) 5 MG tablet Take 1 tablet (5 mg total) by mouth daily. 100 tablet 3  . QUEtiapine (SEROQUEL) 50 MG tablet Take 50-75 mg by mouth at bedtime.    . SUMAtriptan (IMITREX) 100 MG tablet Take 1 tablet by mouth at the onset of a headache and may repeat 1 hour later. 10 tablet 2  . DULoxetine (CYMBALTA) 30 MG capsule Take 1 capsule daily for 1 week and than twice daily 60 capsule 0   No current facility-administered medications for this visit.     Neurologic: Headache: Yes Seizure: No Paresthesias:No  Musculoskeletal: Strength & Muscle Tone: decreased Gait & Station: Difficulty walking Patient leans: Right  Psychiatric Specialty Exam: Review of Systems  Constitutional: Positive for malaise/fatigue.  Eyes:       Legally blind  Musculoskeletal: Positive for back pain and joint pain.  Skin: Negative.  Negative for itching and rash.  Neurological: Positive for tingling, sensory change and headaches.  Psychiatric/Behavioral: Positive for depression. The patient has insomnia.     Blood pressure (!) 142/84, pulse 69, height 5' 4"  (1.626 m), weight 218 lb (98.9 kg), SpO2 95 %.Body mass index is 37.42 kg/m.  General Appearance: Casual  Eye Contact:  Fair  Speech:  Slow  Volume:  Normal  Mood:  Depressed, Dysphoric and Irritable   Affect:  Constricted and Depressed  Thought Process:  Goal Directed  Orientation:  Full (Time, Place, and Person)  Thought Content:  Logical and Rumination  Suicidal Thoughts:  No  Homicidal Thoughts:  No  Memory:  Immediate;   Good Recent;   Good Remote;   Good  Judgement:  Good  Insight:  Good  Psychomotor Activity:  Decreased and Restlessness  Concentration:  Concentration: Fair and Attention Span: Fair  Recall:  [employer] of Knowledge:Good  Language: Good  Akathisia:  No  Handed:  Right  AIMS (if indicated):  0  Assets:  Communication Skills Desire for Improvement Housing Social Support  ADL's:  Intact  Cognition: WNL  Sleep:  2-3 hours    Assessment: Maj. depressive disorder, recurrent moderate.  Rule out bipolar 2.  Plan: I review his symptoms, [facility], current medication, psychosocial stressors and recent blood work results.  Patient is multiple health issues.  He's been experiencing increased depression and one of the contributing factor his back pain.  Recently his primary care physician increase Prozac but it is not helping him.  I recommended to try Cymbalta 30 mg daily for 1 week and then 60 mg daily.  He will reduce Prozac 40 mg for 1 week and then he will discontinue Prozac.  I also suggested to try Seroquel up to 75 mg to help insomnia .  Patient is scheduled to have surgery tomorrow.  He is also taking multiple pain medication including narcotic pain medication, gabapentin.  I do believe patient requires counseling and we will schedule appointment with a therapist in this office.  Discuss in detail medication side effects and benefits.  Discuss  safety plan that anytime having active suicidal thoughts or homicidal thought but he need to call 911 or go to the local emergency room.  Follow-up in 3 weeks.    Catalea Labrecque T., MD 5/1/20189:46 AM

## 2017-02-24 ENCOUNTER — Ambulatory Visit (HOSPITAL_COMMUNITY): Payer: Medicare Other | Admitting: Vascular Surgery

## 2017-02-24 ENCOUNTER — Ambulatory Visit (HOSPITAL_COMMUNITY): Payer: Medicare Other | Admitting: Certified Registered Nurse Anesthetist

## 2017-02-24 ENCOUNTER — Encounter (HOSPITAL_COMMUNITY): Payer: Self-pay | Admitting: *Deleted

## 2017-02-24 ENCOUNTER — Inpatient Hospital Stay (HOSPITAL_COMMUNITY)
Admission: RE | Admit: 2017-02-24 | Discharge: 2017-02-27 | DRG: 519 | Disposition: A | Discharge status: Home / Self Care | Payer: Medicare Other | Source: Ambulatory Visit | Attending: Neurosurgery | Admitting: Neurosurgery

## 2017-02-24 ENCOUNTER — Inpatient Hospital Stay (HOSPITAL_COMMUNITY): Payer: Medicare Other

## 2017-02-24 ENCOUNTER — Encounter (HOSPITAL_COMMUNITY)
Admission: RE | Disposition: A | Discharge status: Home / Self Care | Payer: Self-pay | Source: Ambulatory Visit | Attending: Neurosurgery

## 2017-02-24 DIAGNOSIS — Z96652 Presence of left artificial knee joint: Secondary | ICD-10-CM | POA: Diagnosis present

## 2017-02-24 DIAGNOSIS — M5116 Intervertebral disc disorders with radiculopathy, lumbar region: Secondary | ICD-10-CM | POA: Diagnosis not present

## 2017-02-24 DIAGNOSIS — Z91048 Other nonmedicinal substance allergy status: Secondary | ICD-10-CM

## 2017-02-24 DIAGNOSIS — G960 Cerebrospinal fluid leak: Secondary | ICD-10-CM | POA: Diagnosis not present

## 2017-02-24 DIAGNOSIS — E039 Hypothyroidism, unspecified: Secondary | ICD-10-CM | POA: Diagnosis present

## 2017-02-24 DIAGNOSIS — J45909 Unspecified asthma, uncomplicated: Secondary | ICD-10-CM | POA: Diagnosis present

## 2017-02-24 DIAGNOSIS — H548 Legal blindness, as defined in USA: Secondary | ICD-10-CM | POA: Diagnosis present

## 2017-02-24 DIAGNOSIS — M1990 Unspecified osteoarthritis, unspecified site: Secondary | ICD-10-CM | POA: Diagnosis present

## 2017-02-24 DIAGNOSIS — Z6841 Body Mass Index (BMI) 40.0 and over, adult: Secondary | ICD-10-CM

## 2017-02-24 DIAGNOSIS — K219 Gastro-esophageal reflux disease without esophagitis: Secondary | ICD-10-CM | POA: Diagnosis present

## 2017-02-24 DIAGNOSIS — M5127 Other intervertebral disc displacement, lumbosacral region: Secondary | ICD-10-CM | POA: Diagnosis not present

## 2017-02-24 DIAGNOSIS — Z9889 Other specified postprocedural states: Secondary | ICD-10-CM

## 2017-02-24 DIAGNOSIS — Z881 Allergy status to other antibiotic agents status: Secondary | ICD-10-CM

## 2017-02-24 DIAGNOSIS — Z791 Long term (current) use of non-steroidal anti-inflammatories (NSAID): Secondary | ICD-10-CM

## 2017-02-24 DIAGNOSIS — Z79891 Long term (current) use of opiate analgesic: Secondary | ICD-10-CM | POA: Diagnosis not present

## 2017-02-24 DIAGNOSIS — Y753 Surgical instruments, materials and neurological devices (including sutures) associated with adverse incidents: Secondary | ICD-10-CM | POA: Diagnosis not present

## 2017-02-24 DIAGNOSIS — Z888 Allergy status to other drugs, medicaments and biological substances status: Secondary | ICD-10-CM

## 2017-02-24 DIAGNOSIS — Z8042 Family history of malignant neoplasm of prostate: Secondary | ICD-10-CM

## 2017-02-24 DIAGNOSIS — Z9049 Acquired absence of other specified parts of digestive tract: Secondary | ICD-10-CM

## 2017-02-24 DIAGNOSIS — E232 Diabetes insipidus: Secondary | ICD-10-CM | POA: Diagnosis present

## 2017-02-24 DIAGNOSIS — F419 Anxiety disorder, unspecified: Secondary | ICD-10-CM | POA: Diagnosis present

## 2017-02-24 DIAGNOSIS — Z7952 Long term (current) use of systemic steroids: Secondary | ICD-10-CM | POA: Diagnosis not present

## 2017-02-24 DIAGNOSIS — F329 Major depressive disorder, single episode, unspecified: Secondary | ICD-10-CM | POA: Diagnosis present

## 2017-02-24 DIAGNOSIS — Z79899 Other long term (current) drug therapy: Secondary | ICD-10-CM | POA: Diagnosis not present

## 2017-02-24 DIAGNOSIS — Z818 Family history of other mental and behavioral disorders: Secondary | ICD-10-CM

## 2017-02-24 DIAGNOSIS — Z885 Allergy status to narcotic agent status: Secondary | ICD-10-CM

## 2017-02-24 DIAGNOSIS — Z419 Encounter for procedure for purposes other than remedying health state, unspecified: Secondary | ICD-10-CM

## 2017-02-24 DIAGNOSIS — M5117 Intervertebral disc disorders with radiculopathy, lumbosacral region: Secondary | ICD-10-CM | POA: Diagnosis not present

## 2017-02-24 DIAGNOSIS — E230 Hypopituitarism: Secondary | ICD-10-CM | POA: Diagnosis present

## 2017-02-24 DIAGNOSIS — Y658 Other specified misadventures during surgical and medical care: Secondary | ICD-10-CM | POA: Diagnosis not present

## 2017-02-24 DIAGNOSIS — Z79890 Hormone replacement therapy (postmenopausal): Secondary | ICD-10-CM | POA: Diagnosis not present

## 2017-02-24 DIAGNOSIS — Z8349 Family history of other endocrine, nutritional and metabolic diseases: Secondary | ICD-10-CM

## 2017-02-24 DIAGNOSIS — Y92234 Operating room of hospital as the place of occurrence of the external cause: Secondary | ICD-10-CM | POA: Diagnosis not present

## 2017-02-24 DIAGNOSIS — Z823 Family history of stroke: Secondary | ICD-10-CM

## 2017-02-24 DIAGNOSIS — E785 Hyperlipidemia, unspecified: Secondary | ICD-10-CM | POA: Diagnosis present

## 2017-02-24 DIAGNOSIS — G9741 Accidental puncture or laceration of dura during a procedure: Secondary | ICD-10-CM | POA: Diagnosis not present

## 2017-02-24 DIAGNOSIS — M5126 Other intervertebral disc displacement, lumbar region: Secondary | ICD-10-CM | POA: Diagnosis present

## 2017-02-24 DIAGNOSIS — Z832 Family history of diseases of the blood and blood-forming organs and certain disorders involving the immune mechanism: Secondary | ICD-10-CM

## 2017-02-24 DIAGNOSIS — Z87891 Personal history of nicotine dependence: Secondary | ICD-10-CM

## 2017-02-24 DIAGNOSIS — Z820 Family history of epilepsy and other diseases of the nervous system: Secondary | ICD-10-CM

## 2017-02-24 HISTORY — PX: LUMBAR LAMINECTOMY/DECOMPRESSION MICRODISCECTOMY: SHX5026

## 2017-02-24 SURGERY — LUMBAR LAMINECTOMY/DECOMPRESSION MICRODISCECTOMY 1 LEVEL
Anesthesia: General | Site: Spine Lumbar | Laterality: Right

## 2017-02-24 MED ORDER — PRAVASTATIN SODIUM 40 MG PO TABS
40.0000 mg | ORAL_TABLET | Freq: Every day | ORAL | Status: DC
  Administered 2017-02-24 – 2017-02-26 (×3): 40 mg via ORAL
  Filled 2017-02-24 (×3): qty 1

## 2017-02-24 MED ORDER — CELECOXIB 200 MG PO CAPS
200.0000 mg | ORAL_CAPSULE | Freq: Two times a day (BID) | ORAL | Status: DC
  Administered 2017-02-24 – 2017-02-27 (×5): 200 mg via ORAL
  Filled 2017-02-24 (×6): qty 1

## 2017-02-24 MED ORDER — IPRATROPIUM-ALBUTEROL 0.5-2.5 (3) MG/3ML IN SOLN: 3.0000 mL | Freq: Four times a day (QID) | RESPIRATORY_TRACT | Status: DC | PRN

## 2017-02-24 MED ORDER — MAGNESIUM CITRATE PO SOLN: 1.0000 | Freq: Once | ORAL | Status: DC | PRN

## 2017-02-24 MED ORDER — DESMOPRESSIN ACETATE 0.1 MG PO TABS
0.1000 mg | ORAL_TABLET | Freq: Every day | ORAL | Status: DC
Start: 2017-02-24 — End: 2017-02-24
  Filled 2017-02-24: qty 1

## 2017-02-24 MED ORDER — EPHEDRINE SULFATE 50 MG/ML IJ SOLN
INTRAMUSCULAR | Status: DC | PRN
  Administered 2017-02-24 (×3): 5 mg via INTRAVENOUS
  Administered 2017-02-24 (×2): 10 mg via INTRAVENOUS
  Administered 2017-02-24: 5 mg via INTRAVENOUS

## 2017-02-24 MED ORDER — SENNOSIDES-DOCUSATE SODIUM 8.6-50 MG PO TABS: 1.0000 | ORAL_TABLET | Freq: Every evening | ORAL | Status: DC | PRN

## 2017-02-24 MED ORDER — DEXAMETHASONE 4 MG PO TABS
4.0000 mg | ORAL_TABLET | Freq: Four times a day (QID) | ORAL | Status: AC
  Administered 2017-02-24 – 2017-02-25 (×3): 4 mg via ORAL
  Filled 2017-02-24 (×3): qty 1

## 2017-02-24 MED ORDER — DESMOPRESSIN ACETATE 0.1 MG PO TABS
0.1000 mg | ORAL_TABLET | Freq: Every day | ORAL | Status: DC
  Administered 2017-02-25 – 2017-02-27 (×3): 0.1 mg via ORAL
  Filled 2017-02-24 (×3): qty 1

## 2017-02-24 MED ORDER — BISACODYL 5 MG PO TBEC: 5.0000 mg | DELAYED_RELEASE_TABLET | Freq: Every day | ORAL | Status: DC | PRN

## 2017-02-24 MED ORDER — LIDOCAINE-EPINEPHRINE 1 %-1:100000 IJ SOLN
INTRAMUSCULAR | Status: AC
  Filled 2017-02-24: qty 1

## 2017-02-24 MED ORDER — PHENOL 1.4 % MT LIQD: 1.0000 | OROMUCOSAL | Status: DC | PRN

## 2017-02-24 MED ORDER — ACETAMINOPHEN 650 MG RE SUPP: 650.0000 mg | RECTAL | Status: DC | PRN

## 2017-02-24 MED ORDER — POTASSIUM CHLORIDE IN NACL 20-0.9 MEQ/L-% IV SOLN
INTRAVENOUS | Status: DC
  Administered 2017-02-24 – 2017-02-25 (×3): via INTRAVENOUS
  Filled 2017-02-24 (×4): qty 1000

## 2017-02-24 MED ORDER — LACTATED RINGERS IV SOLN
INTRAVENOUS | Status: DC
Start: 2017-02-24 — End: 2017-02-24
  Administered 2017-02-24 (×3): via INTRAVENOUS

## 2017-02-24 MED ORDER — FENTANYL CITRATE (PF) 250 MCG/5ML IJ SOLN
INTRAMUSCULAR | Status: AC
  Filled 2017-02-24: qty 5

## 2017-02-24 MED ORDER — BACITRACIN ZINC 500 UNIT/GM EX OINT
TOPICAL_OINTMENT | CUTANEOUS | Status: DC | PRN
  Administered 2017-02-24: 1 via TOPICAL

## 2017-02-24 MED ORDER — DIAZEPAM 5 MG PO TABS
5.0000 mg | ORAL_TABLET | Freq: Four times a day (QID) | ORAL | Status: DC | PRN
  Administered 2017-02-24: 5 mg via ORAL
  Filled 2017-02-24: qty 1

## 2017-02-24 MED ORDER — CEFAZOLIN SODIUM-DEXTROSE 2-4 GM/100ML-% IV SOLN
2.0000 g | INTRAVENOUS | Status: AC
  Administered 2017-02-24: 2 g via INTRAVENOUS
  Filled 2017-02-24: qty 100

## 2017-02-24 MED ORDER — MENTHOL 3 MG MT LOZG: 1.0000 | LOZENGE | OROMUCOSAL | Status: DC | PRN

## 2017-02-24 MED ORDER — SODIUM CHLORIDE 0.9% FLUSH: 3.0000 mL | INTRAVENOUS | Status: DC | PRN

## 2017-02-24 MED ORDER — DESMOPRESSIN ACETATE 0.1 MG PO TABS
0.1500 mg | ORAL_TABLET | Freq: Every day | ORAL | Status: DC
  Administered 2017-02-24 – 2017-02-26 (×3): 0.15 mg via ORAL
  Filled 2017-02-24 (×3): qty 2

## 2017-02-24 MED ORDER — DESMOPRESSIN ACETATE 0.1 MG PO TABS
0.1500 mg | ORAL_TABLET | Freq: Every day | ORAL | Status: DC
  Filled 2017-02-24: qty 2

## 2017-02-24 MED ORDER — OXYCODONE HCL 5 MG PO TABS
5.0000 mg | ORAL_TABLET | Freq: Four times a day (QID) | ORAL | Status: DC | PRN
  Administered 2017-02-24 – 2017-02-27 (×4): 5 mg via ORAL
  Filled 2017-02-24 (×3): qty 1

## 2017-02-24 MED ORDER — HYDROMORPHONE HCL 1 MG/ML IJ SOLN
INTRAMUSCULAR | Status: AC
  Filled 2017-02-24: qty 0.5

## 2017-02-24 MED ORDER — OXYCODONE HCL 5 MG PO TABS
ORAL_TABLET | ORAL | Status: AC
  Filled 2017-02-24: qty 1

## 2017-02-24 MED ORDER — SUGAMMADEX SODIUM 500 MG/5ML IV SOLN
INTRAVENOUS | Status: AC
  Filled 2017-02-24: qty 5

## 2017-02-24 MED ORDER — MIDAZOLAM HCL 2 MG/2ML IJ SOLN
INTRAMUSCULAR | Status: AC
  Filled 2017-02-24: qty 2

## 2017-02-24 MED ORDER — LEVOTHYROXINE SODIUM 75 MCG PO TABS
150.0000 ug | ORAL_TABLET | Freq: Every day | ORAL | Status: DC
  Administered 2017-02-25 – 2017-02-27 (×3): 150 ug via ORAL
  Filled 2017-02-24 (×3): qty 2

## 2017-02-24 MED ORDER — BACITRACIN ZINC 500 UNIT/GM EX OINT
TOPICAL_OINTMENT | CUTANEOUS | Status: AC
  Filled 2017-02-24: qty 28.35

## 2017-02-24 MED ORDER — SODIUM CHLORIDE 0.9% FLUSH
3.0000 mL | Freq: Two times a day (BID) | INTRAVENOUS | Status: DC
  Administered 2017-02-24 – 2017-02-26 (×3): 3 mL via INTRAVENOUS

## 2017-02-24 MED ORDER — DEXAMETHASONE SODIUM PHOSPHATE 4 MG/ML IJ SOLN
4.0000 mg | Freq: Four times a day (QID) | INTRAMUSCULAR | Status: AC
  Administered 2017-02-24: 4 mg via INTRAVENOUS
  Filled 2017-02-24 (×2): qty 1

## 2017-02-24 MED ORDER — HEMOSTATIC AGENTS (NO CHARGE) OPTIME
TOPICAL | Status: DC | PRN
  Administered 2017-02-24 (×2): 1 via TOPICAL

## 2017-02-24 MED ORDER — PREDNISONE 5 MG PO TABS
5.0000 mg | ORAL_TABLET | Freq: Every day | ORAL | Status: DC
  Administered 2017-02-26 – 2017-02-27 (×2): 5 mg via ORAL
  Filled 2017-02-24 (×2): qty 1

## 2017-02-24 MED ORDER — FENTANYL CITRATE (PF) 100 MCG/2ML IJ SOLN
INTRAMUSCULAR | Status: DC | PRN
  Administered 2017-02-24: 50 ug via INTRAVENOUS
  Administered 2017-02-24: 100 ug via INTRAVENOUS

## 2017-02-24 MED ORDER — LIDOCAINE HCL (CARDIAC) 20 MG/ML IV SOLN
INTRAVENOUS | Status: DC | PRN
  Administered 2017-02-24: 80 mg via INTRAVENOUS

## 2017-02-24 MED ORDER — ACETAMINOPHEN 325 MG PO TABS: 650.0000 mg | ORAL_TABLET | ORAL | Status: DC | PRN

## 2017-02-24 MED ORDER — DEXAMETHASONE SODIUM PHOSPHATE 10 MG/ML IJ SOLN
INTRAMUSCULAR | Status: AC
  Filled 2017-02-24: qty 1

## 2017-02-24 MED ORDER — ONDANSETRON HCL 4 MG/2ML IJ SOLN
INTRAMUSCULAR | Status: AC
  Filled 2017-02-24: qty 2

## 2017-02-24 MED ORDER — LIDOCAINE 2% (20 MG/ML) 5 ML SYRINGE
INTRAMUSCULAR | Status: AC
  Filled 2017-02-24: qty 5

## 2017-02-24 MED ORDER — CHLORHEXIDINE GLUCONATE CLOTH 2 % EX PADS: 6.0000 | MEDICATED_PAD | Freq: Once | CUTANEOUS | Status: DC

## 2017-02-24 MED ORDER — GABAPENTIN 300 MG PO CAPS
600.0000 mg | ORAL_CAPSULE | Freq: Every day | ORAL | Status: DC
  Administered 2017-02-24 – 2017-02-26 (×3): 600 mg via ORAL
  Filled 2017-02-24 (×2): qty 2

## 2017-02-24 MED ORDER — EPHEDRINE 5 MG/ML INJ
INTRAVENOUS | Status: AC
  Filled 2017-02-24: qty 10

## 2017-02-24 MED ORDER — THROMBIN 5000 UNITS EX SOLR
CUTANEOUS | Status: AC
  Filled 2017-02-24: qty 10000

## 2017-02-24 MED ORDER — ONDANSETRON HCL 4 MG/2ML IJ SOLN
INTRAMUSCULAR | Status: DC | PRN
  Administered 2017-02-24: 4 mg via INTRAVENOUS

## 2017-02-24 MED ORDER — DEXAMETHASONE SODIUM PHOSPHATE 4 MG/ML IJ SOLN
INTRAMUSCULAR | Status: DC | PRN
  Administered 2017-02-24: 10 mg via INTRAVENOUS

## 2017-02-24 MED ORDER — PROPOFOL 10 MG/ML IV BOLUS
INTRAVENOUS | Status: AC
  Filled 2017-02-24: qty 20

## 2017-02-24 MED ORDER — ONDANSETRON HCL 4 MG/2ML IJ SOLN: 4.0000 mg | Freq: Four times a day (QID) | INTRAMUSCULAR | Status: DC | PRN

## 2017-02-24 MED ORDER — MORPHINE SULFATE (PF) 4 MG/ML IV SOLN
2.0000 mg | INTRAVENOUS | Status: DC | PRN
  Administered 2017-02-24 – 2017-02-26 (×5): 2 mg via INTRAVENOUS
  Filled 2017-02-24 (×5): qty 1

## 2017-02-24 MED ORDER — SODIUM CHLORIDE 0.9 % IV SOLN: 250.0000 mL | INTRAVENOUS | Status: DC

## 2017-02-24 MED ORDER — ONDANSETRON HCL 4 MG PO TABS: 4.0000 mg | ORAL_TABLET | Freq: Four times a day (QID) | ORAL | Status: DC | PRN

## 2017-02-24 MED ORDER — 0.9 % SODIUM CHLORIDE (POUR BTL) OPTIME
TOPICAL | Status: DC | PRN
  Administered 2017-02-24: 1000 mL

## 2017-02-24 MED ORDER — THROMBIN 5000 UNITS EX SOLR
CUTANEOUS | Status: DC | PRN
  Administered 2017-02-24 (×2): 5000 [IU] via TOPICAL

## 2017-02-24 MED ORDER — LIDOCAINE-EPINEPHRINE 1 %-1:100000 IJ SOLN
INTRAMUSCULAR | Status: DC | PRN
  Administered 2017-02-24: 10 mL

## 2017-02-24 MED ORDER — PANTOPRAZOLE SODIUM 40 MG PO TBEC
40.0000 mg | DELAYED_RELEASE_TABLET | Freq: Every day | ORAL | Status: DC
  Administered 2017-02-25 – 2017-02-27 (×3): 40 mg via ORAL
  Filled 2017-02-24 (×3): qty 1

## 2017-02-24 MED ORDER — PROMETHAZINE HCL 25 MG/ML IJ SOLN: 6.2500 mg | INTRAMUSCULAR | Status: DC | PRN

## 2017-02-24 MED ORDER — QUETIAPINE FUMARATE 50 MG PO TABS
50.0000 mg | ORAL_TABLET | Freq: Every day | ORAL | Status: DC
  Administered 2017-02-24 – 2017-02-26 (×3): 50 mg via ORAL
  Filled 2017-02-24 (×3): qty 1

## 2017-02-24 MED ORDER — HYDROMORPHONE HCL 1 MG/ML IJ SOLN
0.2500 mg | INTRAMUSCULAR | Status: DC | PRN
  Administered 2017-02-24: 0.5 mg via INTRAVENOUS

## 2017-02-24 MED ORDER — PROPOFOL 10 MG/ML IV BOLUS
INTRAVENOUS | Status: DC | PRN
  Administered 2017-02-24: 150 mg via INTRAVENOUS

## 2017-02-24 MED ORDER — SENNA 8.6 MG PO TABS
1.0000 | ORAL_TABLET | Freq: Two times a day (BID) | ORAL | Status: DC
  Administered 2017-02-24 – 2017-02-27 (×4): 8.6 mg via ORAL
  Filled 2017-02-24 (×6): qty 1

## 2017-02-24 MED ORDER — DULOXETINE HCL 30 MG PO CPEP
30.0000 mg | ORAL_CAPSULE | Freq: Two times a day (BID) | ORAL | Status: DC
  Administered 2017-02-27: 30 mg via ORAL
  Filled 2017-02-24 (×6): qty 1

## 2017-02-24 MED ORDER — DESMOPRESSIN ACETATE 0.1 MG PO TABS: 0.1000 mg | ORAL_TABLET | Freq: Two times a day (BID) | ORAL | Status: DC

## 2017-02-24 MED ORDER — ZOLPIDEM TARTRATE 5 MG PO TABS
5.0000 mg | ORAL_TABLET | Freq: Every evening | ORAL | Status: DC | PRN
  Administered 2017-02-24: 5 mg via ORAL
  Filled 2017-02-24: qty 1

## 2017-02-24 MED ORDER — MIDAZOLAM HCL 5 MG/5ML IJ SOLN
INTRAMUSCULAR | Status: DC | PRN
  Administered 2017-02-24: 2 mg via INTRAVENOUS

## 2017-02-24 MED ORDER — PHENYLEPHRINE HCL 10 MG/ML IJ SOLN
INTRAMUSCULAR | Status: DC | PRN
  Administered 2017-02-24: 40 ug via INTRAVENOUS

## 2017-02-24 MED ORDER — SUGAMMADEX SODIUM 200 MG/2ML IV SOLN
INTRAVENOUS | Status: DC | PRN
  Administered 2017-02-24: 400 mg via INTRAVENOUS

## 2017-02-24 MED ORDER — ROCURONIUM BROMIDE 10 MG/ML (PF) SYRINGE
PREFILLED_SYRINGE | INTRAVENOUS | Status: AC
  Filled 2017-02-24: qty 5

## 2017-02-24 MED ORDER — ROCURONIUM BROMIDE 100 MG/10ML IV SOLN
INTRAVENOUS | Status: DC | PRN
  Administered 2017-02-24: 50 mg via INTRAVENOUS

## 2017-02-24 MED ORDER — VITAMIN D 1000 UNITS PO TABS
1000.0000 [IU] | ORAL_TABLET | Freq: Every day | ORAL | Status: DC
  Administered 2017-02-25 – 2017-02-27 (×3): 1000 [IU] via ORAL
  Filled 2017-02-24 (×3): qty 1

## 2017-02-24 SURGICAL SUPPLY — 51 items
BAG DECANTER FOR FLEXI CONT (MISCELLANEOUS) ×3 IMPLANT
BENZOIN TINCTURE PRP APPL 2/3 (GAUZE/BANDAGES/DRESSINGS) IMPLANT
BLADE CLIPPER SURG (BLADE) IMPLANT
BUR MATCHSTICK NEURO 3.0 LAGG (BURR) ×3 IMPLANT
CANISTER SUCT 3000ML PPV (MISCELLANEOUS) ×3 IMPLANT
CARTRIDGE OIL MAESTRO DRILL (MISCELLANEOUS) ×1 IMPLANT
CLOSURE WOUND 1/2 X4 (GAUZE/BANDAGES/DRESSINGS)
DERMABOND ADVANCED (GAUZE/BANDAGES/DRESSINGS) ×2
DERMABOND ADVANCED .7 DNX12 (GAUZE/BANDAGES/DRESSINGS) ×1 IMPLANT
DIFFUSER DRILL AIR PNEUMATIC (MISCELLANEOUS) ×3 IMPLANT
DRAPE LAPAROTOMY 100X72X124 (DRAPES) ×3 IMPLANT
DRAPE MICROSCOPE LEICA (MISCELLANEOUS) ×3 IMPLANT
DRAPE POUCH INSTRU U-SHP 10X18 (DRAPES) ×3 IMPLANT
DRAPE SURG 17X23 STRL (DRAPES) ×3 IMPLANT
DRSG OPSITE POSTOP 4X6 (GAUZE/BANDAGES/DRESSINGS) ×3 IMPLANT
DURAPREP 26ML APPLICATOR (WOUND CARE) ×3 IMPLANT
ELECT REM PT RETURN 9FT ADLT (ELECTROSURGICAL) ×3
ELECTRODE REM PT RTRN 9FT ADLT (ELECTROSURGICAL) ×1 IMPLANT
GAUZE SPONGE 4X4 12PLY STRL (GAUZE/BANDAGES/DRESSINGS) IMPLANT
GAUZE SPONGE 4X4 16PLY XRAY LF (GAUZE/BANDAGES/DRESSINGS) IMPLANT
GLOVE BIOGEL PI IND STRL 8 (GLOVE) ×2 IMPLANT
GLOVE BIOGEL PI INDICATOR 8 (GLOVE) ×4
GLOVE ECLIPSE 6.5 STRL STRAW (GLOVE) ×3 IMPLANT
GLOVE INDICATOR 8.5 STRL (GLOVE) ×3 IMPLANT
GOWN STRL REUS W/ TWL LRG LVL3 (GOWN DISPOSABLE) ×2 IMPLANT
GOWN STRL REUS W/ TWL XL LVL3 (GOWN DISPOSABLE) ×1 IMPLANT
GOWN STRL REUS W/TWL 2XL LVL3 (GOWN DISPOSABLE) ×3 IMPLANT
GOWN STRL REUS W/TWL LRG LVL3 (GOWN DISPOSABLE) ×4
GOWN STRL REUS W/TWL XL LVL3 (GOWN DISPOSABLE) ×2
GRAFT DURAGEN MATRIX 1WX1L (Tissue) ×3 IMPLANT
KIT BASIN OR (CUSTOM PROCEDURE TRAY) ×3 IMPLANT
KIT ROOM TURNOVER OR (KITS) ×3 IMPLANT
NEEDLE HYPO 25X1 1.5 SAFETY (NEEDLE) ×3 IMPLANT
NEEDLE SPNL 18GX3.5 QUINCKE PK (NEEDLE) IMPLANT
NS IRRIG 1000ML POUR BTL (IV SOLUTION) ×3 IMPLANT
OIL CARTRIDGE MAESTRO DRILL (MISCELLANEOUS) ×3
PACK LAMINECTOMY NEURO (CUSTOM PROCEDURE TRAY) ×3 IMPLANT
PAD ARMBOARD 7.5X6 YLW CONV (MISCELLANEOUS) ×9 IMPLANT
RUBBERBAND STERILE (MISCELLANEOUS) ×6 IMPLANT
SEALANT ADHERUS EXTEND TIP (MISCELLANEOUS) ×2 IMPLANT
SPONGE LAP 4X18 X RAY DECT (DISPOSABLE) IMPLANT
SPONGE SURGIFOAM ABS GEL SZ50 (HEMOSTASIS) ×3 IMPLANT
STRIP CLOSURE SKIN 1/2X4 (GAUZE/BANDAGES/DRESSINGS) IMPLANT
SUT ETHILON 3 0 FSL (SUTURE) ×3 IMPLANT
SUT VIC AB 0 CT1 18XCR BRD8 (SUTURE) ×1 IMPLANT
SUT VIC AB 0 CT1 8-18 (SUTURE) ×2
SUT VIC AB 2-0 CT1 18 (SUTURE) ×3 IMPLANT
SUT VIC AB 3-0 SH 8-18 (SUTURE) ×3 IMPLANT
TOWEL GREEN STERILE (TOWEL DISPOSABLE) ×2 IMPLANT
TOWEL GREEN STERILE FF (TOWEL DISPOSABLE) ×3 IMPLANT
WATER STERILE IRR 1000ML POUR (IV SOLUTION) ×3 IMPLANT

## 2017-02-24 NOTE — H&P (Signed)
There were no vitals taken for this visit. Lawrence Moore is a long-term patient of mine who underwent lumbar laminectomy and discectomy at L5-S1 on the right side. He presents today with a 2 month history of pain which is increasing in severity, running down the back, buttocks, and right lower extremity. No pain whatsoever on the left side. He has pain, numbness and tingling. It hurts for him to stand, it hurts for him to walk. He has been taking anti-inflammatories as he is on prednisone. He has been under a doctor's care. He has had absolutely no improvement. Height 5 feet 4 inches, weight 217 pounds. Temperature is 97.4, blood pressure 164/77, pulse 84. Pain is 10/10. He is alert, oriented by 4, in obvious distress. He has a markedly antalgic gait. Pupils equal, round, reactive to light. Symmetric facial sensation and symmetric movement. He has 2+ reflexes at the knees and at the ankles. He has great difficulty heel walking. He can toe walk without great difficulty.  Allergies  Allergen Reactions  . Erythromycin Swelling    SWELLING REACTION UNSPECIFIED   . Ibuprofen Other (See Comments)    ABDOMINAL PAIN CRAMPING  . Zocor [Simvastatin] Other (See Comments)    ELEVATED LFT's  . Hydrocodone Other (See Comments)    UNSPECIFIED REACTION   . Adhesive [Tape] Other (See Comments)    BANDAIDS > REDNESS TOLERATES PAPER TAPE   Past Medical History:  Diagnosis Date  . Anxiety   . Asthma   . Complication of anesthesia    "woke from anesthia itching" lft knee replacment  . Degenerative joint disease   . Depression   . Diabetes insipidus ([place])   . GERD (gastroesophageal reflux disease)   . Headache    migraines  . Hyperlipidemia   . Hypopituitarism ([place]) 06/28/2014  . Hypothyroidism   . 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 Shortness of breath dyspnea   . Tubular adenoma of colon 2004   Past Surgical History:  Procedure Laterality Date  . ABDOMINAL AORTAGRAM N/A 08/31/2012   Procedure: ABDOMINAL AORTAGRAM;  Surgeon: Rosetta Posner, MD;  Location: [facility] CATH LAB;  Service: Cardiovascular;  Laterality: N/A;  . APPENDECTOMY    . BACK SURGERY    . CHOLECYSTECTOMY  2002   Lap. cholecystectomy  . CRANIOTOMY  1964, 1966   Infratentorial exc. of craniopharyngioma  . JOINT REPLACEMENT  2009   Left Knee replacement  . KNEE ARTHROSCOPY  2006   Right Knee  . KNEE ARTHROSCOPY Left 12  . LUMBAR LAMINECTOMY/DECOMPRESSION MICRODISCECTOMY N/A 10/12/2014   Procedure: Lumbar five-Sacral one microdiskectomy;  Surgeon: Ashok Pall, MD;  Location: [facility];  Service: Neurosurgery;  Laterality: N/A;  Lumbar five-Sacral one microdiskectomy  . LUMBAR LAMINECTOMY/DECOMPRESSION MICRODISCECTOMY Right 01/10/2016   Procedure: LUMBAR LAMINECTOMY/DECOMPRESSION MICRODISCECTOMY 1 LEVEL;  Surgeon: Ashok Pall, MD;  Location: [facility] NEURO ORS;  Service: Neurosurgery;  Laterality: Right;  Right L5S1 microdiskectomy  . SPINE SURGERY  1998   Family History  Problem Relation Age of Onset  . Depression Mother   . Hyperlipidemia Mother   . Other Mother     VARICOSE VEINS  . Deep vein thrombosis Father   . Prostate cancer Father   . Depression Sister   . Hyperlipidemia Sister   . Alzheimer's disease Maternal Grandmother   . Healthy Maternal Grandfather   . Healthy Paternal Grandmother   . Stroke Paternal Grandfather    Social History   Social History  . Marital  status: Single    Spouse name: N/A  . Number of children: 0  . Years of education: 14   Occupational History  . Retired    Social History Main Topics  . Smoking status: Former Smoker    Packs/day: 3.00    Years: 33.00    Types: Cigarettes    Quit date: 01/24/2005  . Smokeless tobacco: Never Used  . Alcohol use No     Comment: 2-3 times a year  . Drug use: No  . Sexual activity: Not Currently   Other Topics Concern  . Not on file   Social History Narrative   Fun: Carve steaks   Prior to Admission medications   Medication  Sig Start Date End Date Taking? Authorizing Provider  celecoxib (CELEBREX) 200 MG capsule Take 200 mg by mouth 2 (two) times daily.   Yes Historical Provider, MD  cholecalciferol (VITAMIN D) 1000 UNITS tablet Take 1,000 Units by mouth daily.   Yes Historical Provider, MD  desmopressin (DDAVP) 0.1 MG tablet Take 1 tablet by mouth  every morning and 1 and 1/2 tablets by mouth at night 07/15/16  Yes Philemon Kingdom, MD  Ipratropium-Albuterol (COMBIVENT RESPIMAT) 20-100 MCG/ACT AERS respimat Inhale 1 puff into the lungs every 6 (six) hours as needed for wheezing or shortness of breath. 01/04/17  Yes Golden Circle, FNP  levothyroxine (SYNTHROID, LEVOTHROID) 150 MCG tablet TAKE 1 TABLET BY MOUTH  DAILY BEFORE BREAKFAST. 07/15/16  Yes Philemon Kingdom, MD  oxyCODONE (OXY IR/ROXICODONE) 5 MG immediate release tablet Take 5 mg by mouth every 6 (six) hours as needed for severe pain.   Yes Historical Provider, MD  pantoprazole (PROTONIX) 40 MG tablet Take 1 tablet (40 mg total) by mouth daily. 12/02/16  Yes Golden Circle, FNP  predniSONE (DELTASONE) 5 MG tablet Take 1 tablet (5 mg total) by mouth daily. 07/15/16  Yes Philemon Kingdom, MD  SUMAtriptan (IMITREX) 100 MG tablet Take 1 tablet by mouth at the onset of a headache and may repeat 1 hour later. 01/04/17  Yes Golden Circle, FNP  DULoxetine (CYMBALTA) 30 MG capsule Take 1 capsule daily for 1 week and than twice daily 02/23/17   Kathlee Nations, MD  gabapentin (NEURONTIN) 300 MG capsule TAKE 2 CAPSULES BY MOUTH AT BEDTIME 02/22/17   Golden Circle, FNP  pravastatin (PRAVACHOL) 40 MG tablet TAKE 1 TABLET BY MOUTH AT  BEDTIME 02/22/17   Golden Circle, FNP  QUEtiapine (SEROQUEL) 50 MG tablet Take 50-75 mg by mouth at bedtime.    Historical Provider, MD    [name] comes in today to go over the MRI of the lumbar spine. What it shows is that he has a herniated disc at L4-L5 which probably is advanced over the time that I have known him. The place where he  had surgery 3-4 and 5-1, both on the right, look okay. He has a significant amount of scarring present at 5-1, but he has had this ever since the operation. The 4-5, again, looks a little bit more advanced central and slightly to the right. He is well-versed in the risks of disc surgery, having had previous surgeries already. He is alert, oriented by 4, answering all questions appropriately. Memory, language, attention span, and fund of knowledge is normal. He has an antalgic gait, favoring the right lower extremity. I did give him a prescription today for Oxycodone. He said the Ultram is not helping. I will see him in the operating room May  2nd for a right-sided L4-L5 lumbar laminectomy and discectomy. Risks including bleeding, infection, no relief, need for further surgery, recurrence, along with other risks were discussed including nerve damage, weakness, bowel or bladder dysfunction. He understands and wishes to proceed.

## 2017-02-24 NOTE — Anesthesia Procedure Notes (Signed)
Procedure Name: Intubation Date/Time: 02/24/2017 1:06 PM Performed by: Ollen Bowl Pre-anesthesia Checklist: Patient identified, Emergency Drugs available, Suction available, Patient being monitored and Timeout performed Patient Re-evaluated:Patient Re-evaluated prior to inductionOxygen Delivery Method: Circle system utilized and Simple face mask Preoxygenation: Pre-oxygenation with 100% oxygen Intubation Type: IV induction Ventilation: Mask ventilation without difficulty Laryngoscope Size: Mac and 4 Grade View: Grade I Tube type: Oral Tube size: 7.5 mm Number of attempts: 1 Airway Equipment and Method: Patient positioned with wedge pillow and Stylet Placement Confirmation: ETT inserted through vocal cords under direct vision,  positive ETCO2 and breath sounds checked- equal and bilateral Secured at: 22 cm Tube secured with: Tape Dental Injury: Teeth and Oropharynx as per pre-operative assessment

## 2017-02-24 NOTE — Anesthesia Preprocedure Evaluation (Signed)
Anesthesia Evaluation  Patient identified by MRN, date of birth, ID band Patient awake    Reviewed: Allergy & Precautions, NPO status , Patient's Chart, lab work & pertinent test results  Airway Mallampati: III     Mouth opening: Limited Mouth Opening  Dental no notable dental hx.    Pulmonary shortness of breath, asthma , former smoker,    breath sounds clear to auscultation       Cardiovascular  Rhythm:Regular Rate:Normal     Neuro/Psych    GI/Hepatic GERD  ,  Endo/Other  Hypothyroidism Morbid obesity  Renal/GU      Musculoskeletal   Abdominal   Peds  Hematology   Anesthesia Other Findings   Reproductive/Obstetrics                             Anesthesia Physical Anesthesia Plan  ASA: II  Anesthesia Plan: General   Post-op Pain Management:    Induction: Intravenous  Airway Management Planned: Video Laryngoscope Planned and Oral ETT  Additional Equipment:   Intra-op Plan:   Post-operative Plan: Extubation in OR  Informed Consent: I have reviewed the patients History and Physical, chart, labs and discussed the procedure including the risks, benefits and alternatives for the proposed anesthesia with the patient or authorized representative who has indicated his/her understanding and acceptance.   Dental advisory given  Plan Discussed with: CRNA  Anesthesia Plan Comments:         Anesthesia Quick Evaluation

## 2017-02-24 NOTE — Progress Notes (Signed)
Pt received from PACU with no noted distress. Surgical dressing site dry and intact. Pt oriented and positioned for comfort. Safety measures in place. Call bell within reach. Family at bedside. Will continue to monitor.

## 2017-02-24 NOTE — Op Note (Signed)
02/24/2017  2:53 PM  PATIENT:  Lawrence Moore  66 y.o. male with pain in his right lower extremity and an enlarged disc herniation at L4/5/ eccentric to the right side. He would like operative decompression of the right L4, and L5 nerve roots.  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 was taken to the operating room, intubated and placed under a general anesthetic without difficulty. He was positioned prone on a Wilson frame with all pressure points padded. His back was prepped and draped in a sterile manner. I opened the skin with a 10 blade and carried the dissection down to the thoracolumbar fascia. I used both sharp dissection and the monopolar cautery to expose the lamina of L4, and L5. I confirmed my location with an intraoperative xray.  I used the drill, Kerrison punches, and curettes to perform a semihemilaminectomy of L4. I used the punches to remove the ligamentum flavum to expose the thecal sac. I brought the microscope into the operative field and with Dr.Cram's assistance we started our decompression of the spinal canal, thecal sac and L4 and L5 root(s). I cauterized epidural veins overlying the disc space then divided them sharply. I opened the disc space with a 15 blade and proceeded with the discectomy. I used pituitary rongeurs, curettes, and other instruments to remove disc material. With retraction of the thecal sac a dural rent occurred. We were able  to place a dural substitute over the opening as it was anterior and not possible to close primarily. After the discectomy was completed we inspected the L5 nerve root and felt it was well decompressed. I explored rostrally, laterally, medially, and caudally and was satisfied with the decompression. I irrigated the wound, then closed in layers. I approximated the thoracolumbar fascia, subcutaneous, and subcuticular planes with vicryl sutures. I used dermabond for a sterile dressing.   PLAN OF CARE: Admit to inpatient   PATIENT DISPOSITION:  PACU - hemodynamically stable.   Delay start of Pharmacological VTE agent (>24hrs) due to surgical blood loss or risk of bleeding:  yes

## 2017-02-24 NOTE — Transfer of Care (Signed)
Immediate Anesthesia Transfer of Care Note  Patient: Lawrence Moore  Procedure(s) Performed: Procedure(s) with comments: RIGHT LUMBAR FOUR-FIVE MICRODISCECTOMY (Right) - MICRODISCECTOMY LUMBAR 4- LUMBAR 5 RIGHT  Patient Location: PACU  Anesthesia Type:General  Level of Consciousness: awake, alert  and oriented  Airway & Oxygen Therapy: Patient Spontanous Breathing and Patient connected to nasal cannula oxygen  Post-op Assessment: Report given to RN, Post -op Vital signs reviewed and stable and Patient moving all extremities X 4  Post vital signs: Reviewed and stable  Last Vitals:  Vitals:   02/24/17 1007 02/24/17 1459  BP: (!) 160/89 134/83  Pulse: 88 89  Resp: 18 (!) 25  Temp: 36.9 C 36.7 C    Last Pain:  Vitals:   02/24/17 1459  TempSrc:   PainSc: 0-No pain      Patients Stated Pain Goal: 6 (92/33/00 7622)  Complications: No apparent anesthesia complications

## 2017-02-25 ENCOUNTER — Encounter (HOSPITAL_COMMUNITY): Payer: Self-pay | Admitting: Neurosurgery

## 2017-02-25 NOTE — Progress Notes (Signed)
Patient ID: Lawrence Moore, male   DOB: 30-Sep-1951, 66 y.o.   MRN: [id number] BP 114/76 (BP Location: Left Arm)   Pulse 81   Temp 98.4 F (36.9 C) (Oral)   Resp 20   Ht 5\' 4"  (1.626 m)   Wt 108.2 kg (238 lb 8.6 oz)   SpO2 94%   BMI 40.94 kg/m  Alert and oriented x 4 speech is clear and fluent Moving all extremities Wound is clean and flat Continue bedrest

## 2017-02-25 NOTE — Consult Note (Signed)
[facility] CM Primary Care Navigator  02/25/2017  Lawrence Moore 12-Aug-1951 [id number]   Met with patient, sister [name] Schultz) and mother Harmon Pier) at the bedside to identify possible discharge needs. Patient reports having increased numbing/ tingling pain running down the back, buttocks, and right lower extremity as well as right foot numbness that had led to this admission/ surgery. Patient endorses Dr. Mauricio Po with [facility] [place] as the primary care provider.   Patient shared using [facility] order service and [facility] [facility] obtain medications without difficulty.   Patient verbalized managing his own medications at home using "pill box" systemweekly.   He reports that he uses [employer] transportation or his sister providestransportation tohis doctors' appointments.  Patient lives with sister (share a house) who will be his primary caregiver at home as stated.   Discharge plan is home with sister's assistance according to patient.  Patient and sister voiced understanding to call primary care provider's office when he gets home for a post discharge follow-up appointment within a week or sooner if needs arise.Patient letter (with PCP's contact number) was provided as a reminder.   Patient denies any health management needs or concerns at this time.  For questions, please contact:  Dannielle Huh, BSN, RN- [facility] Primary Care Navigator  Telephone: [phone] [place]

## 2017-02-25 NOTE — Progress Notes (Signed)
Refused Cymbalta. Patient is being weaned off of Prozac, states "need Prozac 40 mg today then stop taking Prozac for two days, then start Cymbalta next week. Called Doctors office [phone] to request order for Prozac, no one available, Nurse will ask MD to call.

## 2017-02-26 NOTE — Progress Notes (Signed)
Patient ID: Lawrence Moore, male   DOB: 1951-04-19, 66 y.o.   MRN: [id number] BP (!) 180/86 Comment: Pt. had bowel movement  Pulse 68   Temp 97.6 F (36.4 C)   Resp 18   Ht 5\' 4"  (1.626 m)   Wt 108.2 kg (238 lb 8.6 oz)   SpO2 96%   BMI 40.94 kg/m  Alert and oriented x 4, speech is clear and fluent Remains down in bed.  Wound is clean, flat and dry Leave down until tomorrow, may then get out of bed.

## 2017-02-26 NOTE — Anesthesia Postprocedure Evaluation (Addendum)
Anesthesia Post Note  Patient: Vivek Grealish Garciamartinez  Procedure(s) Performed: Procedure(s) (LRB): RIGHT LUMBAR FOUR-FIVE MICRODISCECTOMY (Right)  Patient location during evaluation: PACU Anesthesia Type: General Level of consciousness: awake and alert Pain management: pain level controlled Vital Signs Assessment: post-procedure vital signs reviewed and stable Respiratory status: spontaneous breathing, nonlabored ventilation, respiratory function stable and patient connected to nasal cannula oxygen Cardiovascular status: blood pressure returned to baseline and stable Postop Assessment: no signs of nausea or vomiting Anesthetic complications: no       Last Vitals:  Vitals:   02/26/17 0101 02/26/17 0518  BP: (!) 111/47 140/88  Pulse: 71 69  Resp: 18 16  Temp: 36.7 C 36.6 C    Last Pain:  Vitals:   02/26/17 0518  TempSrc: Oral  PainSc:                  Biana Haggar,JAMES TERRILL

## 2017-02-26 NOTE — Care Management Note (Signed)
Case Management Note  Patient Details  Name: Lawrence Moore MRN: [id number] Date of Birth: 1951-03-29  Subjective/Objective:    Pt underwent:    RIGHT LUMBAR FOUR-FIVE MICRODISCECTOMY. Pt currently on bedrest. He is from home with family.             Action/Plan: Awaiting PT/OT recs once patient is off bedrest. CM following for d/c needs, physician orders.  Expected Discharge Date:                  Expected Discharge Plan:     In-House Referral:     Discharge planning Services     Post Acute Care Choice:    Choice offered to:     DME Arranged:    DME Agency:     HH Arranged:    HH Agency:     Status of Service:  In process, will continue to follow  If discussed at Long Length of Stay Meetings, dates discussed:    Additional Comments:  Pollie Friar, RN 02/26/2017, 2:22 PM

## 2017-02-27 NOTE — Discharge Summary (Signed)
  Physician Discharge Summary  Patient ID: Lawrence Moore MRN: [id number] DOB/AGE: 07-20-1951 66 y.o.  Admit date: 02/24/2017 Discharge date: 02/27/2017  Admission Diagnoses:Lumbar radiculopathy  Discharge Diagnoses: Same Active Problems:   HNP (herniated nucleus pulposus), lumbar   Discharged Condition: good  Hospital Course: Patient admitted hospital underwent lumbar discectomy intraoperative and had an inadvertent dural tear and CSF leak patient was kept flat for 2 days and mobilized and a 3 on a mobilization patient was doing very well no back pain no leg pain no headache he was stable for discharge home scheduled follow-up in one to 2 weeks.  Consults: Significant Diagnostic Studies: Treatments: Lumbar microdiscectomy Discharge Exam: Blood pressure (!) 146/71, pulse 70, temperature 98.5 F (36.9 C), temperature source Oral, resp. rate 20, height 5\' 4"  (1.626 m), weight 108.2 kg (238 lb 8.6 oz), SpO2 97 %. Strength out of 5 wound clean dry and intact  Disposition: Home   Allergies as of 02/27/2017      Reactions   Erythromycin Swelling   SWELLING REACTION UNSPECIFIED    Ibuprofen Other (See Comments)   ABDOMINAL PAIN CRAMPING   Zocor [simvastatin] Other (See Comments)   ELEVATED LFT's   Hydrocodone Other (See Comments)   UNSPECIFIED REACTION    Adhesive [tape] Other (See Comments)   BANDAIDS > REDNESS TOLERATES PAPER TAPE      Medication List    TAKE these medications   celecoxib 200 MG capsule Commonly known as:  CELEBREX Take 200 mg by mouth 2 (two) times daily.   cholecalciferol 1000 units tablet Commonly known as:  VITAMIN D Take 1,000 Units by mouth daily.   desmopressin 0.1 MG tablet Commonly known as:  DDAVP Take 1 tablet by mouth  every morning and 1 and 1/2 tablets by mouth at night   DULoxetine 30 MG capsule Commonly known as:  CYMBALTA Take 1 capsule daily for 1 week and than twice daily   gabapentin 300 MG capsule Commonly known as:   NEURONTIN TAKE 2 CAPSULES BY MOUTH AT BEDTIME   Ipratropium-Albuterol 20-100 MCG/ACT Aers respimat Commonly known as:  COMBIVENT RESPIMAT Inhale 1 puff into the lungs every 6 (six) hours as needed for wheezing or shortness of breath.   levothyroxine 150 MCG tablet Commonly known as:  SYNTHROID, LEVOTHROID TAKE 1 TABLET BY MOUTH  DAILY BEFORE BREAKFAST.   oxyCODONE 5 MG immediate release tablet Commonly known as:  Oxy IR/ROXICODONE Take 5 mg by mouth every 6 (six) hours as needed for severe pain.   pantoprazole 40 MG tablet Commonly known as:  PROTONIX Take 1 tablet (40 mg total) by mouth daily.   pravastatin 40 MG tablet Commonly known as:  PRAVACHOL TAKE 1 TABLET BY MOUTH AT  BEDTIME   predniSONE 5 MG tablet Commonly known as:  DELTASONE Take 1 tablet (5 mg total) by mouth daily.   QUEtiapine 50 MG tablet Commonly known as:  SEROQUEL Take 50-75 mg by mouth at bedtime.   SUMAtriptan 100 MG tablet Commonly known as:  IMITREX Take 1 tablet by mouth at the onset of a headache and may repeat 1 hour later.      Follow-up Information    Ashok Pall, MD Follow up.   Specialty:  Neurosurgery Contact information: [address] [place] [address] [phone]           Signed: Elaina Hoops 02/27/2017, 8:22 AM

## 2017-02-27 NOTE — Progress Notes (Signed)
Patient given discharge instructions.  All questions and concerns addressed.  Patient left unit by wheelchair accompanied by staff, with all belongings.

## 2017-02-27 NOTE — Discharge Instructions (Signed)
Spinal Fusion, Care After °These instructions give you information about caring for yourself after your procedure. Your doctor may also give you more specific instructions. Call your doctor if you have any problems or questions after your procedure. °Follow these instructions at home: °Medicines  °· Take over-the-counter and prescription medicines only as told by your doctor. These include any medicines for pain. °· Do not drive for 24 hours if you received a sedative. °· Do not drive or use heavy machinery while taking prescription pain medicine. °· If you were prescribed an antibiotic medicine, take it as told by your doctor. Do not stop taking the antibiotic even if you start to feel better. °Surgical Cut (Incision) Care  °· Follow instructions from your doctor about how to take care of your surgical cut. Make sure you: °¨ Wash your hands with soap and water before you change your bandage (dressing). If you cannot use soap and water, use hand sanitizer. °¨ Change your bandage as told by your doctor. °¨ Leave stitches (sutures), skin glue, or skin tape (adhesive) strips in place. They may need to stay in place for 2 weeks or longer. If tape strips get loose and curl up, you may trim the loose edges. Do not remove tape strips completely unless your doctor says it is okay. °· Keep your surgical cut clean and dry. Do not take baths, swim, or use a hot tub until your doctor says it is okay. °· Check your surgical cut and the area around it every day for: °¨ Redness. °¨ Swelling. °¨ Fluid. °Physical Activity  °· Return to your normal activities as told by your doctor. Ask your doctor what activities are safe for you. Rest and protect your back as much as you can. °· Follow instructions from your doctor about how to move. Use good posture to help your spine heal. °· Do not lift anything that is heavier than 8 lb (3.6 kg) or as told by your doctor until he or she says that it is safe. Do not lift anything over your  head. °· Do not twist or bend at the waist until your doctor says it is okay. °· Avoid pushing or pulling motions. °· Do not sit or lie down in the same position for long periods of time. °· Do not start to exercise until your doctor says it is okay. Ask your doctor what kinds of exercise you can do to make your back stronger. °General instructions  °· If you were given a brace, use it as told by your doctor. °· Wear compression stockings as told by your doctor. °· Do not use tobacco products. These include cigarettes, chewing tobacco, or e-cigarettes. If you need help quitting, ask your doctor. °· Keep all follow-up visits as told by your doctor. This is important. This includes any visits with your physical therapist, if this applies. °Contact a doctor if: °· Your pain gets worse. °· Your medicine does not help your pain. °· Your legs or feet become painful or swollen. °· Your surgical cut is red, swollen, or painful. °· You have fluid, blood, or pus coming from your surgical cut. °· You feel sick to your stomach (nauseous). °· You throw up (vomit). °· Your have weakness or loss of feeling (numbness) in your legs that is new or getting worse. °· You have a fever. °· You have trouble controlling when you pee (urinate) or poop (have a bowel movement). °Get help right away if: °· Your pain is very   bad. °· You have chest pain. °· You have trouble breathing. °· You start to have a cough. °These symptoms may be an emergency. Do not wait to see if the symptoms will go away. Get medical help right away. Call your local emergency services (911 in the [place] Do not drive yourself to the hospital. °This information is not intended to replace advice given to you by your health care provider. Make sure you discuss any questions you have with your health care provider. °Document Released: 02/05/2011 Document Revised: 06/09/2016 Document Reviewed: 03/27/2015 °[employer]vier Interactive Patient Education © 2017 [employer] ° °

## 2017-03-04 ENCOUNTER — Inpatient Hospital Stay (HOSPITAL_COMMUNITY)
Admission: EM | Admit: 2017-03-04 | Discharge: 2017-03-26 | DRG: 862 | Disposition: E | Payer: Medicare Other | Attending: Pulmonary Disease | Admitting: Pulmonary Disease

## 2017-03-04 ENCOUNTER — Emergency Department (HOSPITAL_COMMUNITY): Payer: Medicare Other

## 2017-03-04 ENCOUNTER — Encounter (HOSPITAL_COMMUNITY): Payer: Self-pay

## 2017-03-04 DIAGNOSIS — N179 Acute kidney failure, unspecified: Secondary | ICD-10-CM | POA: Diagnosis not present

## 2017-03-04 DIAGNOSIS — Z452 Encounter for adjustment and management of vascular access device: Secondary | ICD-10-CM

## 2017-03-04 DIAGNOSIS — K760 Fatty (change of) liver, not elsewhere classified: Secondary | ICD-10-CM | POA: Diagnosis not present

## 2017-03-04 DIAGNOSIS — E8770 Fluid overload, unspecified: Secondary | ICD-10-CM | POA: Diagnosis not present

## 2017-03-04 DIAGNOSIS — G9341 Metabolic encephalopathy: Secondary | ICD-10-CM | POA: Diagnosis not present

## 2017-03-04 DIAGNOSIS — Z66 Do not resuscitate: Secondary | ICD-10-CM | POA: Diagnosis not present

## 2017-03-04 DIAGNOSIS — M5440 Lumbago with sciatica, unspecified side: Secondary | ICD-10-CM | POA: Diagnosis not present

## 2017-03-04 DIAGNOSIS — Z96652 Presence of left artificial knee joint: Secondary | ICD-10-CM | POA: Diagnosis present

## 2017-03-04 DIAGNOSIS — R918 Other nonspecific abnormal finding of lung field: Secondary | ICD-10-CM | POA: Diagnosis not present

## 2017-03-04 DIAGNOSIS — Z8661 Personal history of infections of the central nervous system: Secondary | ICD-10-CM | POA: Diagnosis not present

## 2017-03-04 DIAGNOSIS — J449 Chronic obstructive pulmonary disease, unspecified: Secondary | ICD-10-CM | POA: Diagnosis not present

## 2017-03-04 DIAGNOSIS — E440 Moderate protein-calorie malnutrition: Secondary | ICD-10-CM | POA: Diagnosis not present

## 2017-03-04 DIAGNOSIS — Z885 Allergy status to narcotic agent status: Secondary | ICD-10-CM

## 2017-03-04 DIAGNOSIS — N170 Acute kidney failure with tubular necrosis: Secondary | ICD-10-CM | POA: Diagnosis not present

## 2017-03-04 DIAGNOSIS — J811 Chronic pulmonary edema: Secondary | ICD-10-CM

## 2017-03-04 DIAGNOSIS — R001 Bradycardia, unspecified: Secondary | ICD-10-CM | POA: Diagnosis not present

## 2017-03-04 DIAGNOSIS — G061 Intraspinal abscess and granuloma: Secondary | ICD-10-CM | POA: Diagnosis present

## 2017-03-04 DIAGNOSIS — M5126 Other intervertebral disc displacement, lumbar region: Secondary | ICD-10-CM | POA: Diagnosis not present

## 2017-03-04 DIAGNOSIS — B957 Other staphylococcus as the cause of diseases classified elsewhere: Secondary | ICD-10-CM | POA: Diagnosis not present

## 2017-03-04 DIAGNOSIS — Z7952 Long term (current) use of systemic steroids: Secondary | ICD-10-CM

## 2017-03-04 DIAGNOSIS — H548 Legal blindness, as defined in USA: Secondary | ICD-10-CM | POA: Diagnosis present

## 2017-03-04 DIAGNOSIS — G253 Myoclonus: Secondary | ICD-10-CM | POA: Diagnosis not present

## 2017-03-04 DIAGNOSIS — R509 Fever, unspecified: Secondary | ICD-10-CM

## 2017-03-04 DIAGNOSIS — E2740 Unspecified adrenocortical insufficiency: Secondary | ICD-10-CM | POA: Diagnosis present

## 2017-03-04 DIAGNOSIS — I10 Essential (primary) hypertension: Secondary | ICD-10-CM | POA: Diagnosis not present

## 2017-03-04 DIAGNOSIS — F32A Depression, unspecified: Secondary | ICD-10-CM | POA: Diagnosis present

## 2017-03-04 DIAGNOSIS — I712 Thoracic aortic aneurysm, without rupture: Secondary | ICD-10-CM | POA: Diagnosis not present

## 2017-03-04 DIAGNOSIS — J9600 Acute respiratory failure, unspecified whether with hypoxia or hypercapnia: Secondary | ICD-10-CM

## 2017-03-04 DIAGNOSIS — Z91048 Other nonmedicinal substance allergy status: Secondary | ICD-10-CM

## 2017-03-04 DIAGNOSIS — G9340 Encephalopathy, unspecified: Secondary | ICD-10-CM | POA: Diagnosis not present

## 2017-03-04 DIAGNOSIS — Z781 Physical restraint status: Secondary | ICD-10-CM

## 2017-03-04 DIAGNOSIS — E876 Hypokalemia: Secondary | ICD-10-CM | POA: Diagnosis present

## 2017-03-04 DIAGNOSIS — I639 Cerebral infarction, unspecified: Secondary | ICD-10-CM

## 2017-03-04 DIAGNOSIS — M545 Low back pain: Secondary | ICD-10-CM | POA: Diagnosis not present

## 2017-03-04 DIAGNOSIS — R52 Pain, unspecified: Secondary | ICD-10-CM | POA: Diagnosis not present

## 2017-03-04 DIAGNOSIS — Z881 Allergy status to other antibiotic agents status: Secondary | ICD-10-CM

## 2017-03-04 DIAGNOSIS — R0989 Other specified symptoms and signs involving the circulatory and respiratory systems: Secondary | ICD-10-CM

## 2017-03-04 DIAGNOSIS — F329 Major depressive disorder, single episode, unspecified: Secondary | ICD-10-CM | POA: Diagnosis not present

## 2017-03-04 DIAGNOSIS — R069 Unspecified abnormalities of breathing: Secondary | ICD-10-CM

## 2017-03-04 DIAGNOSIS — M549 Dorsalgia, unspecified: Secondary | ICD-10-CM | POA: Diagnosis not present

## 2017-03-04 DIAGNOSIS — J9602 Acute respiratory failure with hypercapnia: Secondary | ICD-10-CM | POA: Diagnosis not present

## 2017-03-04 DIAGNOSIS — J9601 Acute respiratory failure with hypoxia: Secondary | ICD-10-CM | POA: Diagnosis not present

## 2017-03-04 DIAGNOSIS — Z978 Presence of other specified devices: Secondary | ICD-10-CM | POA: Diagnosis not present

## 2017-03-04 DIAGNOSIS — Z87891 Personal history of nicotine dependence: Secondary | ICD-10-CM | POA: Diagnosis not present

## 2017-03-04 DIAGNOSIS — Z4659 Encounter for fitting and adjustment of other gastrointestinal appliance and device: Secondary | ICD-10-CM

## 2017-03-04 DIAGNOSIS — Z888 Allergy status to other drugs, medicaments and biological substances status: Secondary | ICD-10-CM

## 2017-03-04 DIAGNOSIS — E232 Diabetes insipidus: Secondary | ICD-10-CM | POA: Diagnosis present

## 2017-03-04 DIAGNOSIS — R29818 Other symptoms and signs involving the nervous system: Secondary | ICD-10-CM | POA: Diagnosis not present

## 2017-03-04 DIAGNOSIS — Z9911 Dependence on respirator [ventilator] status: Secondary | ICD-10-CM | POA: Diagnosis not present

## 2017-03-04 DIAGNOSIS — Y838 Other surgical procedures as the cause of abnormal reaction of the patient, or of later complication, without mention of misadventure at the time of the procedure: Secondary | ICD-10-CM | POA: Diagnosis not present

## 2017-03-04 DIAGNOSIS — E119 Type 2 diabetes mellitus without complications: Secondary | ICD-10-CM | POA: Diagnosis present

## 2017-03-04 DIAGNOSIS — Z6841 Body Mass Index (BMI) 40.0 and over, adult: Secondary | ICD-10-CM

## 2017-03-04 DIAGNOSIS — J9690 Respiratory failure, unspecified, unspecified whether with hypoxia or hypercapnia: Secondary | ICD-10-CM

## 2017-03-04 DIAGNOSIS — G062 Extradural and subdural abscess, unspecified: Secondary | ICD-10-CM | POA: Diagnosis not present

## 2017-03-04 DIAGNOSIS — J810 Acute pulmonary edema: Secondary | ICD-10-CM | POA: Diagnosis not present

## 2017-03-04 DIAGNOSIS — E230 Hypopituitarism: Secondary | ICD-10-CM | POA: Diagnosis not present

## 2017-03-04 DIAGNOSIS — M9689 Other intraoperative and postprocedural complications and disorders of the musculoskeletal system: Secondary | ICD-10-CM | POA: Diagnosis not present

## 2017-03-04 DIAGNOSIS — I952 Hypotension due to drugs: Secondary | ICD-10-CM | POA: Diagnosis not present

## 2017-03-04 DIAGNOSIS — A411 Sepsis due to other specified staphylococcus: Secondary | ICD-10-CM | POA: Diagnosis present

## 2017-03-04 DIAGNOSIS — Z8042 Family history of malignant neoplasm of prostate: Secondary | ICD-10-CM

## 2017-03-04 DIAGNOSIS — D696 Thrombocytopenia, unspecified: Secondary | ICD-10-CM | POA: Diagnosis not present

## 2017-03-04 DIAGNOSIS — Y92230 Patient room in hospital as the place of occurrence of the external cause: Secondary | ICD-10-CM | POA: Diagnosis not present

## 2017-03-04 DIAGNOSIS — G8918 Other acute postprocedural pain: Secondary | ICD-10-CM | POA: Diagnosis not present

## 2017-03-04 DIAGNOSIS — R0602 Shortness of breath: Secondary | ICD-10-CM | POA: Diagnosis not present

## 2017-03-04 DIAGNOSIS — I509 Heart failure, unspecified: Secondary | ICD-10-CM | POA: Diagnosis not present

## 2017-03-04 DIAGNOSIS — M79606 Pain in leg, unspecified: Secondary | ICD-10-CM | POA: Diagnosis not present

## 2017-03-04 DIAGNOSIS — Z4682 Encounter for fitting and adjustment of non-vascular catheter: Secondary | ICD-10-CM | POA: Diagnosis not present

## 2017-03-04 DIAGNOSIS — I517 Cardiomegaly: Secondary | ICD-10-CM | POA: Diagnosis not present

## 2017-03-04 DIAGNOSIS — Z9289 Personal history of other medical treatment: Secondary | ICD-10-CM

## 2017-03-04 DIAGNOSIS — R410 Disorientation, unspecified: Secondary | ICD-10-CM | POA: Diagnosis not present

## 2017-03-04 DIAGNOSIS — K7290 Hepatic failure, unspecified without coma: Secondary | ICD-10-CM | POA: Diagnosis not present

## 2017-03-04 DIAGNOSIS — K219 Gastro-esophageal reflux disease without esophagitis: Secondary | ICD-10-CM | POA: Diagnosis present

## 2017-03-04 DIAGNOSIS — E669 Obesity, unspecified: Secondary | ICD-10-CM | POA: Diagnosis not present

## 2017-03-04 DIAGNOSIS — T814XXA Infection following a procedure, initial encounter: Secondary | ICD-10-CM | POA: Diagnosis not present

## 2017-03-04 DIAGNOSIS — R4701 Aphasia: Secondary | ICD-10-CM | POA: Diagnosis not present

## 2017-03-04 DIAGNOSIS — J90 Pleural effusion, not elsewhere classified: Secondary | ICD-10-CM | POA: Diagnosis not present

## 2017-03-04 DIAGNOSIS — E893 Postprocedural hypopituitarism: Secondary | ICD-10-CM | POA: Diagnosis present

## 2017-03-04 DIAGNOSIS — J439 Emphysema, unspecified: Secondary | ICD-10-CM | POA: Diagnosis not present

## 2017-03-04 DIAGNOSIS — Z886 Allergy status to analgesic agent status: Secondary | ICD-10-CM | POA: Diagnosis not present

## 2017-03-04 DIAGNOSIS — D6489 Other specified anemias: Secondary | ICD-10-CM | POA: Diagnosis not present

## 2017-03-04 DIAGNOSIS — E039 Hypothyroidism, unspecified: Secondary | ICD-10-CM | POA: Diagnosis present

## 2017-03-04 DIAGNOSIS — J9621 Acute and chronic respiratory failure with hypoxia: Secondary | ICD-10-CM

## 2017-03-04 DIAGNOSIS — Z79891 Long term (current) use of opiate analgesic: Secondary | ICD-10-CM

## 2017-03-04 DIAGNOSIS — Z791 Long term (current) use of non-steroidal anti-inflammatories (NSAID): Secondary | ICD-10-CM

## 2017-03-04 DIAGNOSIS — E860 Dehydration: Secondary | ICD-10-CM | POA: Diagnosis present

## 2017-03-04 DIAGNOSIS — I6501 Occlusion and stenosis of right vertebral artery: Secondary | ICD-10-CM | POA: Diagnosis not present

## 2017-03-04 DIAGNOSIS — E872 Acidosis: Secondary | ICD-10-CM | POA: Diagnosis present

## 2017-03-04 DIAGNOSIS — T402X5A Adverse effect of other opioids, initial encounter: Secondary | ICD-10-CM | POA: Diagnosis not present

## 2017-03-04 DIAGNOSIS — G4733 Obstructive sleep apnea (adult) (pediatric): Secondary | ICD-10-CM | POA: Diagnosis present

## 2017-03-04 LAB — CBC WITH DIFFERENTIAL/PLATELET
Basophils Absolute: 0 10*3/uL (ref 0.0–0.1)
Basophils Relative: 0 %
EOS PCT: 1 %
Eosinophils Absolute: 0.2 10*3/uL (ref 0.0–0.7)
HEMATOCRIT: 41.5 % (ref 39.0–52.0)
Hemoglobin: 13.8 g/dL (ref 13.0–17.0)
LYMPHS PCT: 14 %
Lymphs Abs: 1.9 10*3/uL (ref 0.7–4.0)
MCH: 29.7 pg (ref 26.0–34.0)
MCHC: 33.3 g/dL (ref 30.0–36.0)
MCV: 89.4 fL (ref 78.0–100.0)
MONO ABS: 0.9 10*3/uL (ref 0.1–1.0)
MONOS PCT: 6 %
NEUTROS ABS: 10.5 10*3/uL — AB (ref 1.7–7.7)
Neutrophils Relative %: 79 %
PLATELETS: 160 10*3/uL (ref 150–400)
RBC: 4.64 MIL/uL (ref 4.22–5.81)
RDW: 13.1 % (ref 11.5–15.5)
WBC: 13.5 10*3/uL — ABNORMAL HIGH (ref 4.0–10.5)

## 2017-03-04 LAB — SEDIMENTATION RATE: Sed Rate: 3 mm/hr (ref 0–16)

## 2017-03-04 LAB — COMPREHENSIVE METABOLIC PANEL
ALBUMIN: 3.6 g/dL (ref 3.5–5.0)
ALK PHOS: 80 U/L (ref 38–126)
ALT: 47 U/L (ref 17–63)
ANION GAP: 8 (ref 5–15)
AST: 28 U/L (ref 15–41)
BILIRUBIN TOTAL: 1 mg/dL (ref 0.3–1.2)
BUN: 8 mg/dL (ref 6–20)
CALCIUM: 8.7 mg/dL — AB (ref 8.9–10.3)
CO2: 30 mmol/L (ref 22–32)
Chloride: 98 mmol/L — ABNORMAL LOW (ref 101–111)
Creatinine, Ser: 1.15 mg/dL (ref 0.61–1.24)
GFR calc Af Amer: >60 mL/min (ref >60)
GFR calc non Af Amer: >60 mL/min (ref >60)
GLUCOSE: 96 mg/dL (ref 65–99)
POTASSIUM: 3 mmol/L — AB (ref 3.5–5.1)
SODIUM: 136 mmol/L (ref 135–145)
TOTAL PROTEIN: 6 g/dL — AB (ref 6.5–8.1)

## 2017-03-04 LAB — URINALYSIS, ROUTINE W REFLEX MICROSCOPIC
Bilirubin Urine: NEGATIVE
GLUCOSE, UA: NEGATIVE mg/dL
HGB URINE DIPSTICK: NEGATIVE
KETONES UR: NEGATIVE mg/dL
LEUKOCYTES UA: NEGATIVE
Nitrite: NEGATIVE
PH: 5 (ref 5.0–8.0)
PROTEIN: NEGATIVE mg/dL
Specific Gravity, Urine: 1.008 (ref 1.005–1.030)

## 2017-03-04 LAB — C-REACTIVE PROTEIN: CRP: 1 mg/dL — ABNORMAL HIGH (ref <1.0)

## 2017-03-04 LAB — I-STAT CG4 LACTIC ACID, ED
LACTIC ACID, VENOUS: 1.01 mmol/L (ref 0.5–1.9)
Lactic Acid, Venous: 0.87 mmol/L (ref 0.5–1.9)

## 2017-03-04 LAB — MAGNESIUM: MAGNESIUM: 1.6 mg/dL — AB (ref 1.7–2.4)

## 2017-03-04 MED ORDER — ONDANSETRON HCL 4 MG/2ML IJ SOLN
4.0000 mg | Freq: Four times a day (QID) | INTRAMUSCULAR | Status: DC | PRN
  Administered 2017-03-06: 4 mg via INTRAVENOUS
  Filled 2017-03-04: qty 2

## 2017-03-04 MED ORDER — OXYCODONE HCL 5 MG PO TABS
5.0000 mg | ORAL_TABLET | Freq: Four times a day (QID) | ORAL | Status: DC | PRN
  Administered 2017-03-05 – 2017-03-06 (×4): 5 mg via ORAL
  Filled 2017-03-04 (×4): qty 1

## 2017-03-04 MED ORDER — DESMOPRESSIN ACETATE 0.1 MG PO TABS
0.1500 mg | ORAL_TABLET | Freq: Every day | ORAL | Status: DC
  Administered 2017-03-04: 0.15 mg via ORAL
  Filled 2017-03-04 (×3): qty 2

## 2017-03-04 MED ORDER — PANTOPRAZOLE SODIUM 40 MG PO TBEC
40.0000 mg | DELAYED_RELEASE_TABLET | Freq: Every day | ORAL | Status: DC
  Administered 2017-03-06: 40 mg via ORAL
  Filled 2017-03-04: qty 1

## 2017-03-04 MED ORDER — PREDNISONE 10 MG PO TABS
10.0000 mg | ORAL_TABLET | Freq: Every day | ORAL | Status: DC
  Filled 2017-03-04: qty 2

## 2017-03-04 MED ORDER — LEVOTHYROXINE SODIUM 75 MCG PO TABS
150.0000 ug | ORAL_TABLET | Freq: Every day | ORAL | Status: DC
  Administered 2017-03-06: 150 ug via ORAL
  Filled 2017-03-04 (×2): qty 2

## 2017-03-04 MED ORDER — PRAVASTATIN SODIUM 40 MG PO TABS
40.0000 mg | ORAL_TABLET | Freq: Every day | ORAL | Status: DC
  Administered 2017-03-04: 40 mg via ORAL
  Filled 2017-03-04: qty 1

## 2017-03-04 MED ORDER — POTASSIUM CHLORIDE CRYS ER 20 MEQ PO TBCR
40.0000 meq | EXTENDED_RELEASE_TABLET | Freq: Once | ORAL | Status: AC
  Administered 2017-03-04: 40 meq via ORAL
  Filled 2017-03-04: qty 2

## 2017-03-04 MED ORDER — MORPHINE SULFATE (PF) 4 MG/ML IV SOLN
4.0000 mg | INTRAVENOUS | Status: DC | PRN
  Administered 2017-03-04 – 2017-03-05 (×4): 4 mg via INTRAVENOUS
  Administered 2017-03-06: 2 mg via INTRAVENOUS
  Administered 2017-03-06: 4 mg via INTRAVENOUS
  Filled 2017-03-04 (×6): qty 1

## 2017-03-04 MED ORDER — ACETAMINOPHEN 325 MG PO TABS: 650.0000 mg | ORAL_TABLET | Freq: Four times a day (QID) | ORAL | Status: DC | PRN

## 2017-03-04 MED ORDER — ACETAMINOPHEN 650 MG RE SUPP: 650.0000 mg | Freq: Four times a day (QID) | RECTAL | Status: DC | PRN

## 2017-03-04 MED ORDER — CELECOXIB 200 MG PO CAPS
200.0000 mg | ORAL_CAPSULE | Freq: Two times a day (BID) | ORAL | Status: DC
  Administered 2017-03-06: 200 mg via ORAL
  Filled 2017-03-04 (×3): qty 1

## 2017-03-04 MED ORDER — MORPHINE SULFATE (PF) 4 MG/ML IV SOLN
4.0000 mg | Freq: Once | INTRAVENOUS | Status: AC
  Administered 2017-03-04: 4 mg via INTRAVENOUS
  Filled 2017-03-04: qty 1

## 2017-03-04 MED ORDER — DESMOPRESSIN ACETATE 0.1 MG PO TABS: 0.0500 mg | ORAL_TABLET | Freq: Two times a day (BID) | ORAL | Status: DC

## 2017-03-04 MED ORDER — DESMOPRESSIN ACETATE 0.1 MG PO TABS
0.1000 mg | ORAL_TABLET | Freq: Every day | ORAL | Status: DC
  Administered 2017-03-06: 0.1 mg via ORAL
  Filled 2017-03-04 (×3): qty 1

## 2017-03-04 MED ORDER — GABAPENTIN 300 MG PO CAPS
600.0000 mg | ORAL_CAPSULE | Freq: Every day | ORAL | Status: DC
  Administered 2017-03-04: 600 mg via ORAL
  Filled 2017-03-04: qty 2

## 2017-03-04 MED ORDER — GADOBENATE DIMEGLUMINE 529 MG/ML IV SOLN
20.0000 mL | Freq: Once | INTRAVENOUS | Status: AC
  Administered 2017-03-04: 20 mL via INTRAVENOUS

## 2017-03-04 MED ORDER — ONDANSETRON HCL 4 MG PO TABS
4.0000 mg | ORAL_TABLET | Freq: Four times a day (QID) | ORAL | Status: DC | PRN
Start: 2017-03-04 — End: 2017-03-15

## 2017-03-04 MED ORDER — ACETAMINOPHEN 325 MG PO TABS
650.0000 mg | ORAL_TABLET | Freq: Once | ORAL | Status: AC
  Administered 2017-03-04: 650 mg via ORAL
  Filled 2017-03-04: qty 2

## 2017-03-04 MED ORDER — SODIUM CHLORIDE 0.9 % IV BOLUS (SEPSIS)
1000.0000 mL | Freq: Once | INTRAVENOUS | Status: AC
  Administered 2017-03-04: 1000 mL via INTRAVENOUS

## 2017-03-04 MED ORDER — ONDANSETRON HCL 4 MG/2ML IJ SOLN
4.0000 mg | Freq: Once | INTRAMUSCULAR | Status: AC
  Administered 2017-03-04: 4 mg via INTRAVENOUS
  Filled 2017-03-04: qty 2

## 2017-03-04 MED ORDER — QUETIAPINE FUMARATE 50 MG PO TABS
50.0000 mg | ORAL_TABLET | Freq: Every day | ORAL | Status: DC
  Administered 2017-03-04: 50 mg via ORAL
  Filled 2017-03-04: qty 1

## 2017-03-04 MED ORDER — SODIUM CHLORIDE 0.9 % IV SOLN
INTRAVENOUS | Status: DC
  Administered 2017-03-04: via INTRAVENOUS
  Administered 2017-03-05: 100 mL/h via INTRAVENOUS
  Administered 2017-03-07 – 2017-03-08 (×2): via INTRAVENOUS

## 2017-03-04 MED ORDER — FLUOXETINE HCL 20 MG PO CAPS: 40.0000 mg | ORAL_CAPSULE | Freq: Every day | ORAL | Status: DC

## 2017-03-04 NOTE — ED Notes (Signed)
Patient transported to MRI 

## 2017-03-04 NOTE — ED Provider Notes (Signed)
[facility] DEPT Provider Note   CSN: [id number] Arrival date & time: 03/13/2017  1451     History   Chief Complaint No chief complaint on file.   HPI Lawrence Moore is a 66 y.o. male who presents today for evaluation of midline lower back pain.  He was discharged on 5/5 after having a lumbar spinal fusion on 5/2.  His pain had been well controled but worsened this morning and has been gradually worsening today.  His pain was 10/10, given 155mcg fentanyl by EMS, pain brought down to 8/10 briefly, now 10/10.  His pain is associated with nausea.  His pain radiates down his left and right legs.  Normally his pain will radiate down his right leg but left leg symptoms are new.  New "slight" headache, started a few hours ago, thinks it is a "stress" headache.  No He denies fevers, chills.   States has been taking his pain medication at home and has not had relief of pain today.  Denies trauma, new activity, lightheadedness, dizziness.    HPI  Past Medical History:  Diagnosis Date  . Anxiety   . Asthma   . Complication of anesthesia    "woke from anesthia itching" lft knee replacment  . Degenerative joint disease   . Depression   . Diabetes insipidus ([place])   . GERD (gastroesophageal reflux disease)   . Headache    migraines  . Hyperlipidemia   . 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 Hypothyroidism   . 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 Shortness of breath dyspnea   . Tubular adenoma of colon 2004    Patient Active Problem List   Diagnosis Date Noted  . Acute pain of right wrist 01/04/2017  . Hyperlipidemia 01/04/2017  . Upper back pain on left side 08/24/2016  . Depression 08/24/2016  . GERD (gastroesophageal reflux disease) 08/24/2016  . HNP (herniated nucleus pulposus), lumbar 10/12/2014  . 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 Osteoporosis 06/28/2014  . PVD (peripheral vascular disease) ([place]) 08/23/2012  . PVD (peripheral vascular disease) with  claudication ([place]) 08/09/2012    Past Surgical History:  Procedure Laterality Date  . ABDOMINAL AORTAGRAM N/A 08/31/2012   Procedure: ABDOMINAL AORTAGRAM;  Surgeon: Rosetta Posner, MD;  Location: [facility] C[facility] LAB;  Service: Cardiovascular;  Laterality: N/A;  . APPENDECTOMY    . BACK SURGERY    . CHOLECYSTECTOMY  2002   Lap. cholecystectomy  . CRANIOTOMY  1964, 1966   Infratentorial exc. of craniopharyngioma  . JOINT REPLACEMENT  2009   Left Knee replacement  . KNEE ARTHROSCOPY  2006   Right Knee  . KNEE ARTHROSCOPY Left 12  . LUMBAR LAMINECTOMY/DECOMPRESSION MICRODISCECTOMY N/A 10/12/2014   Procedure: Lumbar five-Sacral one microdiskectomy;  Surgeon: Ashok Pall, MD;  Location: [facility];  Service: Neurosurgery;  Laterality: N/A;  Lumbar five-Sacral one microdiskectomy  . LUMBAR LAMINECTOMY/DECOMPRESSION MICRODISCECTOMY Right 01/10/2016   Procedure: LUMBAR LAMINECTOMY/DECOMPRESSION MICRODISCECTOMY 1 LEVEL;  Surgeon: Ashok Pall, MD;  Location: [facility] NEURO ORS;  Service: Neurosurgery;  Laterality: Right;  Right L5S1 microdiskectomy  . LUMBAR LAMINECTOMY/DECOMPRESSION MICRODISCECTOMY Right 02/24/2017   Procedure: RIGHT LUMBAR FOUR-FIVE MICRODISCECTOMY;  Surgeon: Ashok Pall, MD;  Location: [facility];  Service: Neurosurgery;  Laterality: Right;  MICRODISCECTOMY LUMBAR 4- LUMBAR 5 RIGHT  . [place] Medications    Prior to Admission medications   Medication Sig Start Date End Date Taking? Authorizing Provider  celecoxib (  CELEBREX) 200 MG capsule Take 200 mg by mouth 2 (two) times daily.    [provider]  cholecalciferol (VITAMIN D) 1000 UNITS tablet Take 1,000 Units by mouth daily.    [provider]  desmopressin (DDAVP) 0.1 MG tablet Take 1 tablet by mouth  every morning and 1 and 1/2 tablets by mouth at night 07/15/16   Philemon Kingdom, MD  DULoxetine (CYMBALTA) 30 MG capsule Take 1 capsule daily for 1 week and than twice daily 02/23/17   Arfeen, Arlyce Harman, MD    gabapentin (NEURONTIN) 300 MG capsule TAKE 2 CAPSULES BY MOUTH AT BEDTIME 02/22/17   Golden Circle, FNP  Ipratropium-Albuterol (COMBIVENT RESPIMAT) 20-100 MCG/ACT AERS respimat Inhale 1 puff into the lungs every 6 (six) hours as needed for wheezing or shortness of breath. 01/04/17   Golden Circle, FNP  levothyroxine (SYNTHROID, LEVOTHROID) 150 MCG tablet TAKE 1 TABLET BY MOUTH  DAILY BEFORE BREAKFAST. 07/15/16   Philemon Kingdom, MD  oxyCODONE (OXY IR/ROXICODONE) 5 MG immediate release tablet Take 5 mg by mouth every 6 (six) hours as needed for severe pain.    [provider]  pantoprazole (PROTONIX) 40 MG tablet Take 1 tablet (40 mg total) by mouth daily. 12/02/16   Golden Circle, FNP  pravastatin (PRAVACHOL) 40 MG tablet TAKE 1 TABLET BY MOUTH AT  BEDTIME 02/22/17   Golden Circle, FNP  predniSONE (DELTASONE) 5 MG tablet Take 1 tablet (5 mg total) by mouth daily. 07/15/16   Philemon Kingdom, MD  QUEtiapine (SEROQUEL) 50 MG tablet Take 50-75 mg by mouth at bedtime.    [provider]  SUMAtriptan (IMITREX) 100 MG tablet Take 1 tablet by mouth at the onset of a headache and may repeat 1 hour later. 01/04/17   Golden Circle, FNP    Family History Family History  Problem Relation Age of Onset  . Depression Mother   . Hyperlipidemia Mother   . Other Mother        VARICOSE VEINS  . Deep vein thrombosis Father   . Prostate cancer Father   . Depression Sister   . Hyperlipidemia Sister   . Alzheimer's disease Maternal Grandmother   . Healthy Maternal Grandfather   . Healthy Paternal Grandmother   . Stroke Paternal Grandfather     Social History Social History  Substance Use Topics  . Smoking status: Former Smoker    Packs/day: 3.00    Years: 33.00    Types: Cigarettes    Quit date: 01/24/2005  . Smokeless tobacco: Never Used  . Alcohol use No     Comment: 2-3 times a year     Allergies   Erythromycin; Ibuprofen; Zocor [simvastatin]; Hydrocodone; and  Adhesive [tape]   Review of Systems Review of Systems  Constitutional: Negative for activity change, fatigue and fever.  HENT: Negative for congestion and postnasal drip.   Eyes: Negative for visual disturbance.  Respiratory: Negative for cough, chest tightness, shortness of breath, wheezing and stridor.   Cardiovascular: Negative for chest pain and palpitations.  Gastrointestinal: Positive for nausea. Negative for abdominal distention, abdominal pain, constipation, diarrhea and vomiting.  Genitourinary: Negative for decreased urine volume, difficulty urinating, hematuria and urgency.  Musculoskeletal: Positive for back pain and gait problem. Negative for joint swelling, myalgias, neck pain and neck stiffness.  Neurological: Positive for weakness, numbness and headaches. Negative for light-headedness.     Physical Exam Updated Vital Signs BP 105/62 (BP Location: Right Arm)   Pulse 79  Temp (!) 101.6 F (38.7 C) (Rectal)   Resp 20   Ht 5\' 4"  (1.626 m)   Wt 108 kg   SpO2 93%   BMI 40.85 kg/m   Physical Exam  Constitutional: He is oriented to person, place, and time. He appears well-developed and well-nourished. No distress.  HENT:  Head: Normocephalic and atraumatic.  Eyes: Conjunctivae are normal. Right eye exhibits no discharge. Left eye exhibits no discharge. No scleral icterus.  Neck: Normal range of motion. Neck supple. No JVD present. No tracheal deviation present.  Cardiovascular: Normal rate, normal heart sounds and intact distal pulses.   No murmur heard. Pulmonary/Chest: Effort normal and breath sounds normal. No stridor. No respiratory distress. He has no wheezes.  Abdominal: Soft. Bowel sounds are normal. He exhibits no distension and no mass. There is no tenderness.  Musculoskeletal:       Lumbar back: He exhibits pain.  Neurological: He is alert and oriented to person, place, and time. A sensory deficit (Left below knee new altered sensation) is present. He  exhibits normal muscle tone.  Skin: Skin is warm and dry. No rash noted. He is not diaphoretic. No erythema. No pallor.  Psychiatric: He has a normal mood and affect. His behavior is normal.  Nursing note and vitals reviewed.    ED Treatments / Results  Labs (all labs ordered are listed, but only abnormal results are displayed) Labs Reviewed  COMPREHENSIVE METABOLIC PANEL - Abnormal; Notable for the following:       Result Value   Potassium 3.0 (*)    Chloride 98 (*)    Calcium 8.7 (*)    Total Protein 6.0 (*)    All other components within normal limits  CBC WITH DIFFERENTIAL/PLATELET - Abnormal; Notable for the following:    WBC 13.5 (*)    Neutro Abs 10.5 (*)    All other components within normal limits  C-REACTIVE PROTEIN - Abnormal; Notable for the following:    CRP 1.0 (*)    All other components within normal limits  CULTURE, BLOOD (ROUTINE X 2)  CULTURE, BLOOD (ROUTINE X 2)  URINE CULTURE  URINALYSIS, ROUTINE W REFLEX MICROSCOPIC  SEDIMENTATION RATE  I-STAT CG4 LACTIC ACID, ED  I-STAT CG4 LACTIC ACID, ED    EKG  EKG Interpretation None       Radiology Dg Chest 2 View  Result Date: 03/24/2017 CLINICAL DATA:  Fever for 2 days. Seven days post lumbar discectomy. EXAM: CHEST  2 VIEW COMPARISON:  Radiographs 04/15/2016 FINDINGS: The cardiomediastinal contours are unchanged. The lungs are clear. Pulmonary vasculature is normal. No consolidation, pleural effusion, or pneumothorax. No acute osseous abnormalities are seen. IMPRESSION: No acute abnormality. Electronically Signed   By: Jeb Levering M.D.   On: 03/12/2017 18:14   Dg Lumbar Spine Complete  Result Date: 03/09/2017 CLINICAL DATA:  Lumbar pain and bilateral lower extremity tingling. Lumbar discectomy 7 days prior. Fever. EXAM: LUMBAR SPINE - COMPLETE 4+ VIEW COMPARISON:  Intraoperative localization lateral views 02/24/2017 FINDINGS: The alignment is maintained. Vertebral body heights are normal. There is no  listhesis. The posterior elements are intact. Minimal disc space narrowing at L5-S1. Remaining disc spaces are preserved. No abnormal bone density or bony destruction. No fracture. Laminectomy defects are not well visualized radiographically. Sacroiliac joints are congruent. IMPRESSION: No acute abnormality visualized radiographically. Electronically Signed   By: Jeb Levering M.D.   On: 03/16/2017 18:21   Mr Lumbar Spine W Wo Contrast  Result Date: 03/09/2017 CLINICAL DATA:  Back pain with bilateral lower extremity tingling and numbness, right worse than left. Recent posterior decompression. EXAM: MRI LUMBAR SPINE WITHOUT AND WITH CONTRAST TECHNIQUE: Multiplanar and multiecho pulse sequences of the lumbar spine were obtained without and with intravenous contrast. CONTRAST:  69mL MULTIHANCE GADOBENATE DIMEGLUMINE 529 MG/ML IV SOLN COMPARISON:  Lumbar spine MRI 02/11/2017 FINDINGS: Segmentation:  Standard Alignment:  Normal Vertebrae: Status post right laminectomy recently at L4 and remote right laminectomy at L5. Conus medullaris: Extends to the lower L1 level and appears normal. Paraspinal and other soft tissues: Postsurgical changes in the posterior paraspinal soft tissues. There is a fluid collection in the right L4 laminectomy bed that measures 3.1 cm AP by 1.4 cm TV by 3.5 cm CC. Severe atrophy of the right psoas muscle. Multiple bilateral renal cysts. Disc levels: T12-L1: Disc degeneration without stenosis. L1-L2: Disc desiccation with minimal bulge.  No stenosis. L2-L3: Disc desiccation and small bulge.  No stenosis. L3-L4:  Disc desiccation and medium-sized bulge.  No stenosis. L4-L5: Recent right L4 laminectomy with fluid collection in the operative bed. There is severe narrowing of the thecal sac, worse than the prior study. The Previously seen central disc protrusion has decreased. There is mild left neural foraminal stenosis. L5-S1: Unchanged central disc protrusion. Distortion of the right  lateral recess secondary to granulation tissue is unchanged. There is a small amounts of fluid tracking along the spinous process. Mild bilateral foraminal stenosis is unchanged. Visualized sacrum: Normal. IMPRESSION: 1. Status post right L4 laminectomy with fluid collection in the operative bed causing mass effect on the dorsal aspect of the thecal sac with worsened narrowing of thecal sac compared to the preoperative study. 2. Unchanged distortion of the right lateral recess at L5-S1 secondary to the presence of granulation tissue. Electronically Signed   By: Ulyses Jarred M.D.   On: 03/05/2017 20:34    Procedures Procedures (including critical care time)  Medications Ordered in ED Medications  morphine 4 MG/ML injection 4 mg (4 mg Intravenous Given 02/26/2017 1520)  ondansetron (ZOFRAN) injection 4 mg (4 mg Intravenous Given 03/03/2017 1521)  sodium chloride 0.9 % bolus 1,000 mL (0 mLs Intravenous Stopped 02/25/2017 1838)  acetaminophen (TYLENOL) tablet 650 mg (650 mg Oral Given 03/18/2017 1713)  potassium chloride SA (K-DUR,KLOR-CON) CR tablet 40 mEq (40 mEq Oral Given 03/14/2017 1713)  morphine 4 MG/ML injection 4 mg (4 mg Intravenous Given 03/17/2017 1713)  morphine 4 MG/ML injection 4 mg (4 mg Intravenous Given 03/07/2017 1847)  gadobenate dimeglumine (MULTIHANCE) injection 20 mL (20 mLs Intravenous Contrast Given 03/25/2017 2004)     Initial Impression / Assessment and Plan / ED Course  I have reviewed the triage vital signs and the nursing notes.  Pertinent labs & imaging results that were available during my care of the patient were reviewed by me and considered in my medical decision making (see chart for details).  Clinical Course as of Mar 04 2101  Thu Mar 04, 2017  1824 Spoke with neurosurgery, said would prefer if medicine admits patient.   [EH]    Clinical Course User Index [EH] Lorin Glass, PA-C   Gigi Gin is 8 days post op back surgery with fever and worsening pain.  He  received a lumber MRI, x-rays and basic labs. He was noted to have low potassium and treated with PO potassium.   He was noted to be febrile (101.6) with a white count of 13.5.  Neurosurgery Dr. Christella Noa came and evaluated the patient and was  informed of MRI results.  He requests medicine to admit the patient for increased post op pain and fever.  At this point no clear source for infection other than recent surgery.  Hospitalist consulted for admission.    The patient appears reasonably stabilized for admission considering the current resources, flow, and capabilities available in the ED at this time, and I doubt any other [facility] requiring further screening and/or treatment in the ED prior to admission.   Final Clinical Impressions(s) / ED Diagnoses   Final diagnoses:  Back pain    New Prescriptions New Prescriptions   No medications on file     Ollen Gross 03/14/2017 2102    Drenda Freeze, MD 03/06/17 0930

## 2017-03-04 NOTE — H&P (Signed)
History and Physical  Patient Name: Lawrence Moore     TML:[id number]    DOB: [birth year]    DOA: 02/28/2017 PCP: Golden Circle, FNP   Patient coming from: Home  Chief Complaint: Back pain, leg numbness, fever  HPI: ADEN SEK is a 66 y.o. 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  The patient had a right L4-5 microdiscectomy on 5/2, had a normal post-op course and was discharged 5 days ago.  Since then, he has been doing better each day, walking, having less and less pain, no wound drainage or other symptoms.  Last night, he had back pain at bedtime, was restless all night, and then this morning when he went to get up, he felt leg numbness, worse on the RIGHT, leg weakness, "like my back was disconnected" and back pain.  He tried his home oxycodone without any relief, and the pain progressively worsened all day until early afternoon it was severe, he had subjective fever, he vomited, his legs were more numb and weak and so he came to the ER.  ED course: -Temp 101.73F, heart rate 106, respirations normal, BP 154/100, pulse ox normal -Na 136, K 3.0, Cr 1.1, WBC 13.5K, Hgb 13.8 -Lactate 0.8 -CRP 1, ESR normal -UA without pyuria or hematuria or nitrits -CXR wtihout airspace opacity -Blood cultures were obtained -MR spine showed a fluid collection with some impingement on the spinal cord "causing mass effect on the dorsal aspect of the thecal sac with worsened narrowing of thecal sac compared to the preoperative study" -Case was discussed with Neurosurgery who recommended medicine service admission for post-operative fever    ROS: Review of Systems  Constitutional: Positive for chills, fever and malaise/fatigue.  Respiratory: Negative for cough and sputum production.   Gastrointestinal: Negative for abdominal pain.    Genitourinary: Negative for dysuria, frequency, hematuria and urgency.  Musculoskeletal: Positive for back pain.  Skin: Negative for rash.  Neurological: Positive for tingling, tremors, sensory change and focal weakness.  All other systems reviewed and are negative.         Past Medical History:  Diagnosis Date  . Anxiety   . Asthma   . Complication of anesthesia    "woke from anesthia itching" lft knee replacment  . Degenerative joint disease   . Depression   . Diabetes insipidus ([place])   . GERD (gastroesophageal reflux disease)   . Headache    migraines  . Hyperlipidemia   . Hypopituitarism ([place]) 06/28/2014  . Hypothyroidism   . 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 Shortness of breath dyspnea   . Tubular adenoma of colon 2004    Past Surgical History:  Procedure Laterality Date  . ABDOMINAL AORTAGRAM N/A 08/31/2012   Procedure: ABDOMINAL AORTAGRAM;  Surgeon: Rosetta Posner, MD;  Location: [facility] CATH LAB;  Service: Cardiovascular;  Laterality: N/A;  . APPENDECTOMY    . BACK SURGERY    . CHOLECYSTECTOMY  2002   Lap. cholecystectomy  . CRANIOTOMY  1964, 1966   Infratentorial exc. of craniopharyngioma  . JOINT REPLACEMENT  2009   Left Knee replacement  . KNEE ARTHROSCOPY  2006   Right Knee  . KNEE ARTHROSCOPY Left 12  . LUMBAR LAMINECTOMY/DECOMPRESSION MICRODISCECTOMY N/A 10/12/2014   Procedure: Lumbar five-Sacral one microdiskectomy;  Surgeon: Ashok Pall, MD;  Location: [place];  Service: Neurosurgery;  Laterality: N/A;  Lumbar five-Sacral one microdiskectomy  . LUMBAR LAMINECTOMY/DECOMPRESSION MICRODISCECTOMY Right 01/10/2016   Procedure: LUMBAR LAMINECTOMY/DECOMPRESSION MICRODISCECTOMY 1 LEVEL;  Surgeon: Ashok Pall, MD;  Location: [place] NEURO ORS;  Service: Neurosurgery;  Laterality: Right;  Right L5S1 microdiskectomy  . LUMBAR LAMINECTOMY/DECOMPRESSION MICRODISCECTOMY Right 02/24/2017   Procedure: RIGHT LUMBAR FOUR-FIVE MICRODISCECTOMY;  Surgeon: Ashok Pall, MD;  Location: [place];  Service: Neurosurgery;  Laterality: Right;  MICRODISCECTOMY LUMBAR 4- LUMBAR 5 RIGHT  . SPINE SURGERY  1998    Social History: Patient lives with his wife.  The patient walks unassisted.  He is from [place], originally [place]  W[employer].  Former smoker, quit in 2006.    Allergies  Allergen Reactions  . Erythromycin Swelling    SWELLING REACTION UNSPECIFIED   . Ibuprofen Other (See Comments)    ABDOMINAL PAIN CRAMPING  . Zocor [Simvastatin] Other (See Comments)    ELEVATED LFT's  . Hydrocodone Other (See Comments)    UNSPECIFIED REACTION   . Adhesive [Tape] Other (See Comments)    BANDAIDS > REDNESS TOLERATES PAPER TAPE    Family history: family history includes Alzheimer's disease in his maternal grandmother; Deep vein thrombosis in his father; Depression in his mother and sister; Healthy in his maternal grandfather and paternal grandmother; Hyperlipidemia in his mother and sister; Other in his mother; Prostate cancer in his father; Stroke in his paternal grandfather.  Prior to Admission medications   Medication Sig Start Date End Date Taking? Authorizing Provider  celecoxib (CELEBREX) 200 MG capsule Take 200 mg by mouth 2 (two) times daily.   Yes [provider]  cholecalciferol (VITAMIN D) 1000 UNITS tablet Take 1,000 Units by mouth daily.   Yes [provider]  desmopressin (DDAVP) 0.1 MG tablet Take 1 tablet by mouth  every morning and 1 and 1/2 tablets by mouth at night 07/15/16  Yes Philemon Kingdom, MD  FLUoxetine (PROZAC) 20 MG capsule Take 40 mg by mouth every morning.   Yes [provider]  gabapentin (NEURONTIN) 300 MG capsule TAKE 2 CAPSULES BY MOUTH AT BEDTIME 02/22/17  Yes Golden Circle, FNP  Ipratropium-Albuterol (COMBIVENT RESPIMAT) 20-100 MCG/ACT AERS respimat Inhale 1 puff into the lungs every 6 (six) hours as needed for wheezing or shortness of breath. 01/04/17  Yes Golden Circle, FNP  levothyroxine (SYNTHROID, LEVOTHROID) 150 MCG tablet TAKE 1 TABLET BY MOUTH  DAILY BEFORE BREAKFAST. 07/15/16  Yes Philemon Kingdom, MD  oxyCODONE (OXY IR/ROXICODONE) 5 MG immediate release tablet Take 5 mg by mouth every 6 (six) hours as needed for severe pain.   Yes [provider]  pantoprazole (PROTONIX) 40 MG tablet Take 1 tablet (40 mg total) by mouth daily. 12/02/16  Yes Golden Circle, FNP  pravastatin (PRAVACHOL) 40 MG tablet TAKE 1 TABLET BY MOUTH AT  BEDTIME 02/22/17  Yes Golden Circle, FNP  predniSONE (DELTASONE) 5 MG tablet Take 1 tablet (5 mg total) by mouth daily. 07/15/16  Yes Philemon Kingdom, MD  QUEtiapine (SEROQUEL) 50 MG tablet Take 50 mg by mouth at bedtime.    Yes [provider]  SUMAtriptan (IMITREX) 100 MG tablet Take 1 tablet by mouth at the onset of a headache and may repeat 1 hour later. 01/04/17  Yes Golden Circle, FNP  DULoxetine (CYMBALTA) 30 MG capsule Take 1 capsule daily for 1 week and than twice daily Patient not taking: Reported on 03/11/2017 02/23/17   Arfeen, Arlyce Harman, MD  Physical Exam: BP 118/67   Pulse 87   Temp (!) 101.6 F (38.7 C) (Rectal)   Resp 20   Ht 5' 4"  (1.626 m)   Wt 108 kg (238 lb)   SpO2 92%   BMI 40.85 kg/m  General appearance: Well-developed, obese adult male, alert and in no acute distress, slightly euphoric/lightheaded from morphine.   Eyes: Anicteric, conjunctiva pink, lids and lashes normal. PERRL.    ENT: No nasal deformity, discharge, epistaxis.  Hearing normal. OP moist without lesions.  Partially edentulous. Skin: Warm and dry.  No jaundice.  No suspicious rashes or lesions.  Back incision clean dry and intact.  Small old scar on forehead. Cardiac: RRR, nl S1-S2, no murmurs appreciated.  Capillary refill is brisk.  JVP not visible.  No LE edema.  Radial pulses 2+ and symmetric. Respiratory: Normal respiratory rate and rhythm.  CTAB without rales.  Scant wheeze. Abdomen: Abdomen soft.  No  TTP. No ascites, distension, hepatosplenomegaly.   MSK: No deformities or effusions.  No cyanosis or clubbing. Neuro: Cranial nerves 3-12 intact.  Sensation intact to light touch in upper body, diminished on both legs, more on right. Speech is fluent.  Muscle strength limited by pain in legs, but feels weaker on right.  FTN normal.   Psych: Sensorium intact and responding to questions, attention affected by morphine, otherwise normal.  Behavior appropriate.  Affect pleasant.  Judgment and insight appear normal.     Labs on Admission:  I have personally reviewed following labs and imaging studies: CBC:  Recent Labs Lab 02/25/2017 1530  WBC 13.5*  NEUTROABS 10.5*  HGB 13.8  HCT 41.5  MCV 89.4  PLT 263   Basic Metabolic Panel:  Recent Labs Lab 03/10/2017 1530  NA 136  K 3.0*  CL 98*  CO2 30  GLUCOSE 96  BUN 8  CREATININE 1.15  CALCIUM 8.7*   GFR: Estimated Creatinine Clearance: 72.2 mL/min (by C-G formula based on SCr of 1.15 mg/dL).  Liver Function Tests:  Recent Labs Lab 03/14/2017 1530  AST 28  ALT 47  ALKPHOS 80  BILITOT 1.0  PROT 6.0*  ALBUMIN 3.6   No results for input(s): LIPASE, AMYLASE in the last 168 hours. No results for input(s): AMMONIA in the last 168 hours. Coagulation Profile: No results for input(s): INR, PROTIME in the last 168 hours. Cardiac Enzymes: No results for input(s): CKTOTAL, CKMB, CKMBINDEX, TROPONINI in the last 168 hours. BNP (last 3 results) No results for input(s): PROBNP in the last 8760 hours. HbA1C: No results for input(s): HGBA1C in the last 72 hours. CBG: No results for input(s): GLUCAP in the last 168 hours. Lipid Profile: No results for input(s): CHOL, HDL, LDLCALC, TRIG, CHOLHDL, LDLDIRECT in the last 72 hours. Thyroid Function Tests: No results for input(s): TSH, T4TOTAL, FREET4, T3FREE, THYROIDAB in the last 72 hours. Anemia Panel: No results for input(s): VITAMINB12, FOLATE, FERRITIN, TIBC, IRON, RETICCTPCT in the  last 72 hours. Sepsis Labs: Lactic acid 0.8 Invalid input(s): PROCALCITONIN, LACTICIDVEN No results found for this or any previous visit (from the past 240 hour(s)).       Radiological Exams on Admission: Personally reviewed CXR clear without focal airspace disease; lumbar radiograph and MR spine reports reviewed: Dg Chest 2 View  Result Date: 03/11/2017 CLINICAL DATA:  Fever for 2 days. Seven days post lumbar discectomy. EXAM: CHEST  2 VIEW COMPARISON:  Radiographs 04/15/2016 FINDINGS: The cardiomediastinal contours are unchanged. The lungs are clear. Pulmonary vasculature is normal. No consolidation,  pleural effusion, or pneumothorax. No acute osseous abnormalities are seen. IMPRESSION: No acute abnormality. Electronically Signed   By: Jeb Levering M.D.   On: 03/07/2017 18:14   Dg Lumbar Spine Complete  Result Date: 03/07/2017 CLINICAL DATA:  Lumbar pain and bilateral lower extremity tingling. Lumbar discectomy 7 days prior. Fever. EXAM: LUMBAR SPINE - COMPLETE 4+ VIEW COMPARISON:  Intraoperative localization lateral views 02/24/2017 FINDINGS: The alignment is maintained. Vertebral body heights are normal. There is no listhesis. The posterior elements are intact. Minimal disc space narrowing at L5-S1. Remaining disc spaces are preserved. No abnormal bone density or bony destruction. No fracture. Laminectomy defects are not well visualized radiographically. Sacroiliac joints are congruent. IMPRESSION: No acute abnormality visualized radiographically. Electronically Signed   By: Jeb Levering M.D.   On: 03/16/2017 18:21   Mr Lumbar Spine W Wo Contrast  Result Date: 02/23/2017 CLINICAL DATA:  Back pain with bilateral lower extremity tingling and numbness, right worse than left. Recent posterior decompression. EXAM: MRI LUMBAR SPINE WITHOUT AND WITH CONTRAST TECHNIQUE: Multiplanar and multiecho pulse sequences of the lumbar spine were obtained without and with intravenous contrast. CONTRAST:   52m MULTIHANCE GADOBENATE DIMEGLUMINE 529 MG/ML IV SOLN COMPARISON:  Lumbar spine MRI 02/11/2017 FINDINGS: Segmentation:  Standard Alignment:  Normal Vertebrae: Status post right laminectomy recently at L4 and remote right laminectomy at L5. Conus medullaris: Extends to the lower L1 level and appears normal. Paraspinal and other soft tissues: Postsurgical changes in the posterior paraspinal soft tissues. There is a fluid collection in the right L4 laminectomy bed that measures 3.1 cm AP by 1.4 cm TV by 3.5 cm CC. Severe atrophy of the right psoas muscle. Multiple bilateral renal cysts. Disc levels: T12-L1: Disc degeneration without stenosis. L1-L2: Disc desiccation with minimal bulge.  No stenosis. L2-L3: Disc desiccation and small bulge.  No stenosis. L3-L4:  Disc desiccation and medium-sized bulge.  No stenosis. L4-L5: Recent right L4 laminectomy with fluid collection in the operative bed. There is severe narrowing of the thecal sac, worse than the prior study. The Previously seen central disc protrusion has decreased. There is mild left neural foraminal stenosis. L5-S1: Unchanged central disc protrusion. Distortion of the right lateral recess secondary to granulation tissue is unchanged. There is a small amounts of fluid tracking along the spinous process. Mild bilateral foraminal stenosis is unchanged. Visualized sacrum: Normal. IMPRESSION: 1. Status post right L4 laminectomy with fluid collection in the operative bed causing mass effect on the dorsal aspect of the thecal sac with worsened narrowing of thecal sac compared to the preoperative study. 2. Unchanged distortion of the right lateral recess at L5-S1 secondary to the presence of granulation tissue. Electronically Signed   By: KUlyses JarredM.D.   On: 03/16/2017 20:34        Assessment/Plan  1. Fever, back pain, leg numbness, fluid collection impinging at L4-5:  Suspect this is post-operative infection.  Nothing clinically to point to pneumonia  or UTI nor anywhere else, and he has findings on MR that correspond to his clinical findings and symptoms.  Not currently septic, although met SIRS briefly in ER.   -Fluids -Acetaminophen for fever -Discussed with Dr. SVertell Limberwho was in contact with Dr. CChristella Noa-WIll attempt IR aspiration for culture of fluid collection tomorrow, and can start IV antibiotics after that, sooner if decompensates -NPO at breakfast -Follow blood and urine cultures -Morphine for pain overnight   2. Hypokalemia:  -Check mag -Supplement K  3. Hypopituitarism:  -Continue levothyroxine -Continue DDAVP -  Not on testosterone or GH -Increase prednisone to double dose for now  4. COPD:  Not using maintenance inhalers now -Albuterol if needed  5. Depression:  -Continue Seroquel, Prozac  6. Other medications:  -Continue gabapentin -Continue oxycodone if needed -Continue statin -Continue PPI      DVT prophylaxis: SCDs  Code Status: FULL  Family Communication: Wfie at bedside  Disposition Plan: Anticipate IR aspiration and culture tomorrow, then start antibiotics pending fever curve Consults called: Neurosurgery Admission status: INPATIENT    Medical decision making: Patient seen at 9:50 PM on 02/24/2017.  The patient was discussed with Dr. Lowella Petties.  What exists of the patient's chart was reviewed in depth and summarized above.  Clinical condition: stable.        Edwin Dada [facility] Pager [phone]

## 2017-03-04 NOTE — ED Notes (Signed)
Patient transported to X-ray 

## 2017-03-04 NOTE — ED Triage Notes (Signed)
PT arrives EMS with complaints of back pain, bilateral lower extremity tingling and numbness (worse in right) that started this morning. PT is post op for spinal fusion on may 3 at L5.   Actively vomiting x 1 during triage with sudden onset of nausea at time of arrival.   #20 LAC- pt received 178mcg Fentanyl PTA with EMS. Pain 8/10 after medication.   170/96  Hr 84 rr 20 o2 97 RA cbg 123

## 2017-03-05 ENCOUNTER — Inpatient Hospital Stay (HOSPITAL_COMMUNITY): Payer: Medicare Other

## 2017-03-05 DIAGNOSIS — Z818 Family history of other mental and behavioral disorders: Secondary | ICD-10-CM

## 2017-03-05 DIAGNOSIS — Z823 Family history of stroke: Secondary | ICD-10-CM

## 2017-03-05 DIAGNOSIS — T814XXA Infection following a procedure, initial encounter: Principal | ICD-10-CM

## 2017-03-05 DIAGNOSIS — J449 Chronic obstructive pulmonary disease, unspecified: Secondary | ICD-10-CM

## 2017-03-05 DIAGNOSIS — G062 Extradural and subdural abscess, unspecified: Secondary | ICD-10-CM

## 2017-03-05 DIAGNOSIS — E669 Obesity, unspecified: Secondary | ICD-10-CM

## 2017-03-05 DIAGNOSIS — Z96652 Presence of left artificial knee joint: Secondary | ICD-10-CM

## 2017-03-05 DIAGNOSIS — E876 Hypokalemia: Secondary | ICD-10-CM

## 2017-03-05 DIAGNOSIS — Y838 Other surgical procedures as the cause of abnormal reaction of the patient, or of later complication, without mention of misadventure at the time of the procedure: Secondary | ICD-10-CM

## 2017-03-05 DIAGNOSIS — Z886 Allergy status to analgesic agent status: Secondary | ICD-10-CM

## 2017-03-05 DIAGNOSIS — Z8249 Family history of ischemic heart disease and other diseases of the circulatory system: Secondary | ICD-10-CM

## 2017-03-05 DIAGNOSIS — E230 Hypopituitarism: Secondary | ICD-10-CM

## 2017-03-05 DIAGNOSIS — Z885 Allergy status to narcotic agent status: Secondary | ICD-10-CM

## 2017-03-05 DIAGNOSIS — Z888 Allergy status to other drugs, medicaments and biological substances status: Secondary | ICD-10-CM

## 2017-03-05 DIAGNOSIS — Z881 Allergy status to other antibiotic agents status: Secondary | ICD-10-CM

## 2017-03-05 DIAGNOSIS — Z8349 Family history of other endocrine, nutritional and metabolic diseases: Secondary | ICD-10-CM

## 2017-03-05 DIAGNOSIS — J439 Emphysema, unspecified: Secondary | ICD-10-CM

## 2017-03-05 DIAGNOSIS — M5440 Lumbago with sciatica, unspecified side: Secondary | ICD-10-CM

## 2017-03-05 DIAGNOSIS — F329 Major depressive disorder, single episode, unspecified: Secondary | ICD-10-CM

## 2017-03-05 DIAGNOSIS — Z820 Family history of epilepsy and other diseases of the nervous system: Secondary | ICD-10-CM

## 2017-03-05 DIAGNOSIS — Z87891 Personal history of nicotine dependence: Secondary | ICD-10-CM

## 2017-03-05 LAB — GLUCOSE, CAPILLARY
GLUCOSE-CAPILLARY: 74 mg/dL (ref 65–99)
GLUCOSE-CAPILLARY: 57 mg/dL — AB (ref 65–99)
GLUCOSE-CAPILLARY: 78 mg/dL (ref 65–99)
Glucose-Capillary: 18 mg/dL — CL (ref 65–99)
Glucose-Capillary: 83 mg/dL (ref 65–99)

## 2017-03-05 LAB — URINE CULTURE: CULTURE: NO GROWTH

## 2017-03-05 LAB — BASIC METABOLIC PANEL
ANION GAP: 6 (ref 5–15)
BUN: 10 mg/dL (ref 6–20)
CALCIUM: 8.2 mg/dL — AB (ref 8.9–10.3)
CO2: 30 mmol/L (ref 22–32)
CREATININE: 1.08 mg/dL (ref 0.61–1.24)
Chloride: 97 mmol/L — ABNORMAL LOW (ref 101–111)
Glucose, Bld: 99 mg/dL (ref 65–99)
Potassium: 4 mmol/L (ref 3.5–5.1)
SODIUM: 133 mmol/L — AB (ref 135–145)

## 2017-03-05 LAB — BLOOD GAS, ARTERIAL
Acid-base deficit: 0.8 mmol/L (ref 0.0–2.0)
BICARBONATE: 24.9 mmol/L (ref 20.0–28.0)
Drawn by: 41875
FIO2: 1
O2 Saturation: 86.2 %
PATIENT TEMPERATURE: 98.6
PCO2 ART: 53.6 mmHg — AB (ref 32.0–48.0)
PH ART: 7.289 — AB (ref 7.350–7.450)
PO2 ART: 56.1 mmHg — AB (ref 83.0–108.0)

## 2017-03-05 LAB — CBC
HEMATOCRIT: 39.4 % (ref 39.0–52.0)
HEMOGLOBIN: 12.9 g/dL — AB (ref 13.0–17.0)
MCH: 29.9 pg (ref 26.0–34.0)
MCHC: 32.7 g/dL (ref 30.0–36.0)
MCV: 91.2 fL (ref 78.0–100.0)
PLATELETS: 144 10*3/uL — AB (ref 150–400)
RBC: 4.32 MIL/uL (ref 4.22–5.81)
RDW: 13.2 % (ref 11.5–15.5)
WBC: 15.6 10*3/uL — ABNORMAL HIGH (ref 4.0–10.5)

## 2017-03-05 LAB — APTT: aPTT: 32 seconds (ref 24–36)

## 2017-03-05 LAB — HIV ANTIBODY (ROUTINE TESTING W REFLEX): HIV Screen 4th Generation wRfx: NONREACTIVE

## 2017-03-05 LAB — PROTIME-INR
INR: 1.16
PROTHROMBIN TIME: 14.9 s (ref 11.4–15.2)

## 2017-03-05 MED ORDER — NALOXONE HCL 0.4 MG/ML IJ SOLN
0.4000 mg | INTRAMUSCULAR | Status: DC | PRN
  Administered 2017-03-05: 0.4 mg via INTRAVENOUS

## 2017-03-05 MED ORDER — FENTANYL CITRATE (PF) 100 MCG/2ML IJ SOLN
INTRAMUSCULAR | Status: AC
Start: 2017-03-05 — End: 2017-03-06
  Filled 2017-03-05: qty 4

## 2017-03-05 MED ORDER — MIDAZOLAM HCL 2 MG/2ML IJ SOLN
INTRAMUSCULAR | Status: DC | PRN
Start: 2017-03-05 — End: 2017-03-06
  Administered 2017-03-05 (×2): 1 mg via INTRAVENOUS

## 2017-03-05 MED ORDER — DEXTROSE 50 % IV SOLN
INTRAVENOUS | Status: AC
  Filled 2017-03-05: qty 50

## 2017-03-05 MED ORDER — VANCOMYCIN HCL 10 G IV SOLR
2000.0000 mg | Freq: Once | INTRAVENOUS | Status: AC
  Administered 2017-03-05: 2000 mg via INTRAVENOUS
  Filled 2017-03-05: qty 2000

## 2017-03-05 MED ORDER — MIDAZOLAM HCL 2 MG/2ML IJ SOLN
INTRAMUSCULAR | Status: AC
  Filled 2017-03-05: qty 6

## 2017-03-05 MED ORDER — LIDOCAINE HCL 1 % IJ SOLN
INTRAMUSCULAR | Status: AC
  Filled 2017-03-05: qty 20

## 2017-03-05 MED ORDER — DEXTROSE 5 % IV SOLN
2.0000 g | INTRAVENOUS | Status: DC
  Administered 2017-03-05 – 2017-03-08 (×4): 2 g via INTRAVENOUS
  Filled 2017-03-05 (×5): qty 2

## 2017-03-05 MED ORDER — HYDROMORPHONE HCL 1 MG/ML IJ SOLN
1.0000 mg | Freq: Once | INTRAMUSCULAR | Status: AC
  Administered 2017-03-05: 1 mg via INTRAVENOUS
  Filled 2017-03-05: qty 1

## 2017-03-05 MED ORDER — FENTANYL CITRATE (PF) 100 MCG/2ML IJ SOLN
INTRAMUSCULAR | Status: DC | PRN
  Administered 2017-03-05 (×2): 25 ug via INTRAVENOUS

## 2017-03-05 MED ORDER — NALOXONE HCL 0.4 MG/ML IJ SOLN
INTRAMUSCULAR | Status: AC
  Administered 2017-03-05: 0.4 mg via INTRAVENOUS
  Filled 2017-03-05: qty 1

## 2017-03-05 MED ORDER — VANCOMYCIN HCL 10 G IV SOLR
1250.0000 mg | Freq: Two times a day (BID) | INTRAVENOUS | Status: DC
  Administered 2017-03-06: 1250 mg via INTRAVENOUS
  Filled 2017-03-05 (×3): qty 1250

## 2017-03-05 NOTE — Consult Note (Signed)
[facility] for Infectious Disease  Date of Admission:  03/24/2017  Date of Consult:  03/05/2017  Reason for Consult: Post-surgical abscess Referring Physician: Dr. Tana Coast  Impression/Recommendation Post-surgical abscess s/p L4-5 discectomy 02/24/17 Panhypopituitarism COPD Depression  BCx 5/10 >> No Growth < 24 hours  Patient with significant lumbar disease s/p L4-5 discectomy on 5/2 returning with fever, worsened back pain, and lower extremity weakness. Suspect post-op infection/abscess as cause although inflammatory markers are unremarkable. UA is negative and CXR w/o acute cardiopulmonary disease. Appreciate holding antibiotics for fluid aspiration.  - f/u fluid aspiration culture - Start Vancomycin plus Ceftriaxone - Appreciate Neurosurgery follow-up - f/u blood cultures  Thank you so much for this interesting consult,   Zada Finders, MD Internal Medicine PGY-2  ID attdg- Pt seen, discussed with h/o Note confirmed.  Start vanco/ceftriaxone.  Await Cx.   [email or web address]  HPI: History obtained from chart review as patient was down for procedure. Lawrence Moore is an 66 y.o. 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  He was recently admitted from 5/2-5/5 for Right L4-5 microdiscectomy performed on 02/25/16 with an inadvertent dural tear and CSF leak intraopertively. Patient did well post-op and was discharged on 5/5.   He returned to the ED on 5/10 with fevers and worsening back pain and lower extremity numbness and weakness. Pain began in his low back with radiation down the back of both legs. He had associated weakness without bowel/bladder dysfunction and new numbness of his right leg. He also had associated fevers, chills, diaphoresis, nausea without vomiting,  and headache. He says he was doing well after his recent surgery, able to walk to the mailbox and back without issue before this began. He was febrile to 101.8F with leukocytosis of 13.5k. ESR was 3 and CRP was 1.0. Urinalysis was normal. HIV non-reactive. Blood cultures were obtained. Plain films of the chest and L-spine were without acute abnormality.   MRI Lumbar spine was read as tatus post right L4 laminectomy with fluid collection in the operative bed causing mass effect on the dorsal aspect of the thecal sac with worsened narrowing of thecal sac compared to the preoperative study. Neurosurgery were consulted who recommended medicine admission for post-op fever. Antibiotics were held and IR were consulted for fluid aspiration under CT guidance. Recent antibiotics include perioperative Cefazolin 2 grams on 02/25/16.   Past Medical History:  Diagnosis Date  . Anxiety   . Asthma   . Complication of anesthesia    "woke from anesthia itching" lft knee replacment  . Degenerative joint disease   . Depression   . Diabetes insipidus ([place])   . GERD (gastroesophageal reflux disease)   . Headache    migraines  . Hyperlipidemia   . Hypopituitarism ([place]) 06/28/2014  . Hypothyroidism   . 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 Shortness of breath dyspnea   . Tubular adenoma of colon 2004    Past Surgical History:  Procedure Laterality Date  . ABDOMINAL AORTAGRAM N/A 08/31/2012   Procedure: ABDOMINAL AORTAGRAM;  Surgeon: Rosetta Posner, MD;  Location: [facility] CATH LAB;  Service: Cardiovascular;  Laterality: N/A;  . APPENDECTOMY    . BACK SURGERY    . CHOLECYSTECTOMY  2002   Lap. cholecystectomy  . [place],  1966   Infratentorial exc. of craniopharyngioma  . JOINT REPLACEMENT  2009   Left Knee replacement  . KNEE ARTHROSCOPY  2006   Right Knee  . KNEE ARTHROSCOPY Left 12  . LUMBAR LAMINECTOMY/DECOMPRESSION MICRODISCECTOMY N/A 10/12/2014   Procedure: Lumbar five-Sacral one  microdiskectomy;  Surgeon: Ashok Pall, MD;  Location: [facility];  Service: Neurosurgery;  Laterality: N/A;  Lumbar five-Sacral one microdiskectomy  . LUMBAR LAMINECTOMY/DECOMPRESSION MICRODISCECTOMY Right 01/10/2016   Procedure: LUMBAR LAMINECTOMY/DECOMPRESSION MICRODISCECTOMY 1 LEVEL;  Surgeon: Ashok Pall, MD;  Location: [facility] NEURO ORS;  Service: Neurosurgery;  Laterality: Right;  Right L5S1 microdiskectomy  . LUMBAR LAMINECTOMY/DECOMPRESSION MICRODISCECTOMY Right 02/24/2017   Procedure: RIGHT LUMBAR FOUR-FIVE MICRODISCECTOMY;  Surgeon: Ashok Pall, MD;  Location: [facility];  Service: Neurosurgery;  Laterality: Right;  MICRODISCECTOMY LUMBAR 4- LUMBAR 5 RIGHT  . SPINE SURGERY  1998     Allergies  Allergen Reactions  . Adhesive [Tape] Other (See Comments)    BANDAIDS > REDNESS PAPER TAPE (CAUSES REDNESS & IRRITATION): (03/05/2016)  . Erythromycin Swelling    SWELLING REACTION UNSPECIFIED   . Ibuprofen Other (See Comments)    ABDOMINAL PAIN CRAMPING  . Zocor [Simvastatin] Other (See Comments)    ELEVATED LFT's  . Hydrocodone Other (See Comments)    UNSPECIFIED REACTION     Medications: I have reviewed the patient's current medications.  Abtx:  Anti-infectives    None      Total days of antibiotics: 0          Social History:  reports that he quit smoking about 12 years ago. His smoking use included Cigarettes. He has a 99.00 pack-year smoking history. He has never used smokeless tobacco. He reports that he does not drink alcohol or use drugs.  Family History  Problem Relation Age of Onset  . Depression Mother   . Hyperlipidemia Mother   . Other Mother        VARICOSE VEINS  . Deep vein thrombosis Father   . Prostate cancer Father   . Depression Sister   . Hyperlipidemia Sister   . Alzheimer's disease Maternal Grandmother   . Healthy Maternal Grandfather   . Healthy Paternal Grandmother   . Stroke Paternal Grandfather     Review of Systems  Constitutional: Positive for  chills, diaphoresis and fever.  Gastrointestinal: Positive for nausea. Negative for abdominal pain, constipation, diarrhea and vomiting.  Musculoskeletal: Positive for back pain.  Neurological: Positive for tingling, sensory change, focal weakness, weakness and headaches.     Blood pressure 124/80, pulse 99, temperature 98.7 F (37.1 C), temperature source Oral, resp. rate 18, height 5' 4"  (1.626 m), weight 218 lb 4.8 oz (99 kg), SpO2 94 %. Physical Exam  Constitutional: He is oriented to person, place, and time.  Obese male, uncomfortable in bed  HENT:  Head: Normocephalic and atraumatic.  Neck: Normal range of motion.  Cardiovascular: Normal rate and regular rhythm.   No murmur heard. Pulmonary/Chest: Effort normal. No respiratory distress. He has no wheezes. He has no rales.  Abdominal: Soft. He exhibits no distension. There is no tenderness.  Musculoskeletal: He exhibits no edema.  Lumbar surgical site C/D/I with sutures in place, no discharge present. Area tender to palpation. Well healed surgical scar left knee.  Neurological: He is alert and oriented to person, place, and time.  Skin: Skin is warm. He is not diaphoretic.     Results for orders placed or performed during the hospital encounter of 03/20/2017 (from the past 48 hour(s))  Comprehensive metabolic panel     Status: Abnormal   Collection Time: 03/08/2017  3:30 PM  Result Value Ref Range   Sodium 136 135 - 145 mmol/L   Potassium 3.0 (L) 3.5 - 5.1 mmol/L   Chloride 98 (L) 101 - 111 mmol/L   CO2 30 22 - 32 mmol/L   Glucose, Bld 96 65 - 99 mg/dL   BUN 8 6 - 20 mg/dL   Creatinine, Ser 1.15 0.61 - 1.24 mg/dL   Calcium 8.7 (L) 8.9 - 10.3 mg/dL   Total Protein 6.0 (L) 6.5 - 8.1 g/dL   Albumin 3.6 3.5 - 5.0 g/dL   AST 28 15 - 41 U/L   ALT 47 17 - 63 U/L   Alkaline Phosphatase 80 38 - 126 U/L   Total Bilirubin 1.0 0.3 - 1.2 mg/dL   GFR calc non Af Amer >60 >60 mL/min   GFR calc Af Amer >60 >60 mL/min    Comment:  (NOTE) The eGFR has been calculated using the CKD EPI equation. This calculation has not been validated in all clinical situations. eGFR's persistently <60 mL/min signify possible Chronic Kidney Disease.    Anion gap 8 5 - 15  CBC with Differential     Status: Abnormal   Collection Time: 03/02/2017  3:30 PM  Result Value Ref Range   WBC 13.5 (H) 4.0 - 10.5 K/uL   RBC 4.64 4.22 - 5.81 MIL/uL   Hemoglobin 13.8 13.0 - 17.0 g/dL   HCT 41.5 39.0 - 52.0 %   MCV 89.4 78.0 - 100.0 fL   MCH 29.7 26.0 - 34.0 pg   MCHC 33.3 30.0 - 36.0 g/dL   RDW 13.1 11.5 - 15.5 %   Platelets 160 150 - 400 K/uL   Neutrophils Relative % 79 %   Neutro Abs 10.5 (H) 1.7 - 7.7 K/uL   Lymphocytes Relative 14 %   Lymphs Abs 1.9 0.7 - 4.0 K/uL   Monocytes Relative 6 %   Monocytes Absolute 0.9 0.1 - 1.0 K/uL   Eosinophils Relative 1 %   Eosinophils Absolute 0.2 0.0 - 0.7 K/uL   Basophils Relative 0 %   Basophils Absolute 0.0 0.0 - 0.1 K/uL  Blood culture (routine x 2)     Status: None (Preliminary result)   Collection Time: 03/20/2017  4:44 PM  Result Value Ref Range   Specimen Description BLOOD RIGHT ANTECUBITAL    Special Requests      BOTTLES DRAWN AEROBIC AND ANAEROBIC Blood Culture adequate volume   Culture NO GROWTH < 24 HOURS    Report Status PENDING   I-Stat CG4 Lactic Acid, ED     Status: None   Collection Time: 02/26/2017  4:56 PM  Result Value Ref Range   Lactic Acid, Venous 0.87 0.5 - 1.9 mmol/L  Sedimentation rate     Status: None   Collection Time: 03/11/2017  6:52 PM  Result Value Ref Range   Sed Rate 3 0 - 16 mm/hr  C-reactive protein     Status: Abnormal   Collection Time: 02/25/2017  6:52 PM  Result Value Ref Range   CRP 1.0 (H) <1.0 mg/dL  Blood culture (routine x 2)     Status: None (Preliminary result)   Collection Time: 03/13/2017  6:53 PM  Result Value Ref Range   Specimen Description BLOOD RIGHT ANTECUBITAL    Special Requests      BOTTLES DRAWN AEROBIC AND ANAEROBIC Blood Culture  adequate volume   Culture NO  GROWTH < 24 HOURS    Report Status PENDING   Urinalysis, Routine w reflex microscopic     Status: None   Collection Time: 03/07/2017  7:02 PM  Result Value Ref Range   Color, Urine YELLOW YELLOW   APPearance CLEAR CLEAR   Specific Gravity, Urine 1.008 1.005 - 1.030   pH 5.0 5.0 - 8.0   Glucose, UA NEGATIVE NEGATIVE mg/dL   Hgb urine dipstick NEGATIVE NEGATIVE   Bilirubin Urine NEGATIVE NEGATIVE   Ketones, ur NEGATIVE NEGATIVE mg/dL   Protein, ur NEGATIVE NEGATIVE mg/dL   Nitrite NEGATIVE NEGATIVE   Leukocytes, UA NEGATIVE NEGATIVE  Urine culture     Status: None   Collection Time: 03/16/2017  7:02 PM  Result Value Ref Range   Specimen Description URINE, RANDOM    Special Requests NONE    Culture NO GROWTH    Report Status 03/05/2017 FINAL   I-Stat CG4 Lactic Acid, ED     Status: None   Collection Time: 03/06/2017  8:41 PM  Result Value Ref Range   Lactic Acid, Venous 1.01 0.5 - 1.9 mmol/L  Magnesium     Status: Abnormal   Collection Time: 03/02/2017 11:24 PM  Result Value Ref Range   Magnesium 1.6 (L) 1.7 - 2.4 mg/dL  HIV antibody (Routine Testing)     Status: None   Collection Time: 03/05/17  3:29 AM  Result Value Ref Range   HIV Screen 4th Generation wRfx Non Reactive Non Reactive    Comment: (NOTE) Performed At: [facility] [place], [place] [id number] Lindon Romp MD LK:[id number]   CBC     Status: Abnormal   Collection Time: 03/05/17  3:29 AM  Result Value Ref Range   WBC 15.6 (H) 4.0 - 10.5 K/uL   RBC 4.32 4.22 - 5.81 MIL/uL   Hemoglobin 12.9 (L) 13.0 - 17.0 g/dL   HCT 39.4 39.0 - 52.0 %   MCV 91.2 78.0 - 100.0 fL   MCH 29.9 26.0 - 34.0 pg   MCHC 32.7 30.0 - 36.0 g/dL   RDW 13.2 11.5 - 15.5 %   Platelets 144 (L) 150 - 400 K/uL  Basic metabolic panel     Status: Abnormal   Collection Time: 03/05/17  3:29 AM  Result Value Ref Range   Sodium 133 (L) 135 - 145 mmol/L   Potassium 4.0 3.5 - 5.1 mmol/L     Comment: DELTA CHECK NOTED   Chloride 97 (L) 101 - 111 mmol/L   CO2 30 22 - 32 mmol/L   Glucose, Bld 99 65 - 99 mg/dL   BUN 10 6 - 20 mg/dL   Creatinine, Ser 1.08 0.61 - 1.24 mg/dL   Calcium 8.2 (L) 8.9 - 10.3 mg/dL   GFR calc non Af Amer >60 >60 mL/min   GFR calc Af Amer >60 >60 mL/min    Comment: (NOTE) The eGFR has been calculated using the CKD EPI equation. This calculation has not been validated in all clinical situations. eGFR's persistently <60 mL/min signify possible Chronic Kidney Disease.    Anion gap 6 5 - 15  Protime-INR     Status: None   Collection Time: 03/05/17  6:41 AM  Result Value Ref Range   Prothrombin Time 14.9 11.4 - 15.2 seconds   INR 1.16   APTT     Status: None   Collection Time: 03/05/17  6:41 AM  Result Value Ref Range   aPTT 32 24 - 36 seconds  Glucose, capillary     Status: Abnormal   Collection Time: 03/05/17  6:55 AM  Result Value Ref Range   Glucose-Capillary 18 (LL) 65 - 99 mg/dL   Comment 1 Notify RN    Comment 2 Document in Chart   Glucose, capillary     Status: None   Collection Time: 03/05/17  6:58 AM  Result Value Ref Range   Glucose-Capillary 83 65 - 99 mg/dL   Comment 1 Notify RN    Comment 2 Document in Chart   Glucose, capillary     Status: None   Collection Time: 03/05/17 11:10 AM  Result Value Ref Range   Glucose-Capillary 74 65 - 99 mg/dL  Glucose, capillary     Status: Abnormal   Collection Time: 03/05/17  4:37 PM  Result Value Ref Range   Glucose-Capillary 57 (L) 65 - 99 mg/dL      Component Value Date/Time   SDES URINE, RANDOM 03/07/2017 1902   SPECREQUEST NONE 02/24/2017 1902   CULT NO GROWTH 03/01/2017 1902   REPTSTATUS 03/05/2017 FINAL 03/20/2017 1902   Dg Chest 2 View  Result Date: 03/16/2017 CLINICAL DATA:  Fever for 2 days. Seven days post lumbar discectomy. EXAM: CHEST  2 VIEW COMPARISON:  Radiographs 04/15/2016 FINDINGS: The cardiomediastinal contours are unchanged. The lungs are clear. Pulmonary  vasculature is normal. No consolidation, pleural effusion, or pneumothorax. No acute osseous abnormalities are seen. IMPRESSION: No acute abnormality. Electronically Signed   By: Jeb Levering M.D.   On: 03/25/2017 18:14   Dg Lumbar Spine Complete  Result Date: 03/10/2017 CLINICAL DATA:  Lumbar pain and bilateral lower extremity tingling. Lumbar discectomy 7 days prior. Fever. EXAM: LUMBAR SPINE - COMPLETE 4+ VIEW COMPARISON:  Intraoperative localization lateral views 02/24/2017 FINDINGS: The alignment is maintained. Vertebral body heights are normal. There is no listhesis. The posterior elements are intact. Minimal disc space narrowing at L5-S1. Remaining disc spaces are preserved. No abnormal bone density or bony destruction. No fracture. Laminectomy defects are not well visualized radiographically. Sacroiliac joints are congruent. IMPRESSION: No acute abnormality visualized radiographically. Electronically Signed   By: Jeb Levering M.D.   On: 02/26/2017 18:21   Mr Lumbar Spine W Wo Contrast  Result Date: 03/14/2017 CLINICAL DATA:  Back pain with bilateral lower extremity tingling and numbness, right worse than left. Recent posterior decompression. EXAM: MRI LUMBAR SPINE WITHOUT AND WITH CONTRAST TECHNIQUE: Multiplanar and multiecho pulse sequences of the lumbar spine were obtained without and with intravenous contrast. CONTRAST:  31m MULTIHANCE GADOBENATE DIMEGLUMINE 529 MG/ML IV SOLN COMPARISON:  Lumbar spine MRI 02/11/2017 FINDINGS: Segmentation:  Standard Alignment:  Normal Vertebrae: Status post right laminectomy recently at L4 and remote right laminectomy at L5. Conus medullaris: Extends to the lower L1 level and appears normal. Paraspinal and other soft tissues: Postsurgical changes in the posterior paraspinal soft tissues. There is a fluid collection in the right L4 laminectomy bed that measures 3.1 cm AP by 1.4 cm TV by 3.5 cm CC. Severe atrophy of the right psoas muscle. Multiple  bilateral renal cysts. Disc levels: T12-L1: Disc degeneration without stenosis. L1-L2: Disc desiccation with minimal bulge.  No stenosis. L2-L3: Disc desiccation and small bulge.  No stenosis. L3-L4:  Disc desiccation and medium-sized bulge.  No stenosis. L4-L5: Recent right L4 laminectomy with fluid collection in the operative bed. There is severe narrowing of the thecal sac, worse than the prior study. The Previously seen central disc protrusion has decreased. There is mild left neural foraminal stenosis. L5-S1:  Unchanged central disc protrusion. Distortion of the right lateral recess secondary to granulation tissue is unchanged. There is a small amounts of fluid tracking along the spinous process. Mild bilateral foraminal stenosis is unchanged. Visualized sacrum: Normal. IMPRESSION: 1. Status post right L4 laminectomy with fluid collection in the operative bed causing mass effect on the dorsal aspect of the thecal sac with worsened narrowing of thecal sac compared to the preoperative study. 2. Unchanged distortion of the right lateral recess at L5-S1 secondary to the presence of granulation tissue. Electronically Signed   By: Ulyses Jarred M.D.   On: 02/25/2017 20:34   Recent Results (from the past 240 hour(s))  Blood culture (routine x 2)     Status: None (Preliminary result)   Collection Time: 03/10/2017  4:44 PM  Result Value Ref Range Status   Specimen Description BLOOD RIGHT ANTECUBITAL  Final   Special Requests   Final    BOTTLES DRAWN AEROBIC AND ANAEROBIC Blood Culture adequate volume   Culture NO GROWTH < 24 HOURS  Final   Report Status PENDING  Incomplete  Blood culture (routine x 2)     Status: None (Preliminary result)   Collection Time: 02/26/2017  6:53 PM  Result Value Ref Range Status   Specimen Description BLOOD RIGHT ANTECUBITAL  Final   Special Requests   Final    BOTTLES DRAWN AEROBIC AND ANAEROBIC Blood Culture adequate volume   Culture NO GROWTH < 24 HOURS  Final   Report Status  PENDING  Incomplete  Urine culture     Status: None   Collection Time: 03/05/2017  7:02 PM  Result Value Ref Range Status   Specimen Description URINE, RANDOM  Final   Special Requests NONE  Final   Culture NO GROWTH  Final   Report Status 03/05/2017 FINAL  Final      03/05/2017, 5:00 PM     LOS: 1 day    Records and images were personally reviewed where available.

## 2017-03-05 NOTE — Procedures (Signed)
Interventional Radiology Procedure Note  Procedure: Aspiration of right L4 laminectomy surgical site fluid collection yields 5 mL turbid yellowish fluid.  Samples sent for culture.   Complications: None  Estimated Blood Loss: None  Recommendations:  - Cultures are pending  Signed,  Criselda Peaches, MD

## 2017-03-05 NOTE — Significant Event (Addendum)
Rapid Response Event Note  Overview: Time Called: 2147 Arrival Time: 2149 Event Type: Respiratory  Initial Focused Assessment: Called by bedside RN for unresponsive pt with sats in the 40s.  Upon arrival, the pt is obtunded and opens eyes briefly to voice.  O2 sats on dynamap reading 50%.  Assisted ventilations with BVM and achieved sats in the upper 80s.  Pt was immediately given 0.4 mg Narcan.  Within 2 minutes, pt was more alert and complaining of pain.  A NRB was placed at 15LPM to maintain sats 94-98 % and the decision was made to place pt on Bipap.    Of note, bedside RN reports pt received dilaudid 1 mg at 1849.  This is the last documented opioid admin in pt's MAR.  Prior to, pt had received  administrations of prn pain meds at 1546, 1657, and 1740.   Following narcan administration, pt is alert, oriented x 4.  Moves all extremities purposefully, sensation present bilaterally in legs, trunk and arms.  Lung sounds are diminished in all fields.  Respirations symmetrical and equal.  Heart tones are normal, rhythm is regular.  Skin is pale, warm and dry.   2230 VS:  HR 102, SPO2 100% on bipap, BP 116/62, RR 20   Interventions:  Narcan 0.4 mg IV, Pt placed on Bipap, ABG ordered (7.28, 53.6, 56, 24.9)  PCXR.    Plan of Care (if not transferred):  Tx SDU    Event Summary: Name of Physician Notified: Donnal Debar, NP at 2200    at    Outcome: Transferred (Comment) (SDU)     Pam Drown

## 2017-03-05 NOTE — Sedation Documentation (Signed)
Patient is resting comfortably. 

## 2017-03-05 NOTE — Care Management Note (Signed)
Case Management Note  Patient Details  Name: Lawrence Moore MRN: [id number] Date of Birth: May 05, 1951  Subjective/Objective:   Pt admitted with epidural abscess. He is from home with family.                  Action/Plan: Plan is for aspiration of abscess in IR. CM following for d/c needs, physician orders.   Expected Discharge Date:                  Expected Discharge Plan:     In-House Referral:     Discharge planning Services     Post Acute Care Choice:    Choice offered to:     DME Arranged:    DME Agency:     HH Arranged:    HH Agency:     Status of Service:  In process, will continue to follow  If discussed at Long Length of Stay Meetings, dates discussed:    Additional Comments:  Pollie Friar, RN 03/05/2017, 1:47 PM

## 2017-03-05 NOTE — Progress Notes (Signed)
RN was called to patient's room by NT, patient was non-responsive with O2 sat in the 40's.  RN called rapid response, patient given narcan.  Rapid response RN gave respiratory resuscitation with bagging then placed on NRB, O2 saturation between 85-100%.  MD notified and came to patient's room.  Patient was placed on Bipap & orders received to transfer patient to stepdown.  RN will continue to monitor patient

## 2017-03-05 NOTE — Progress Notes (Signed)
Pharmacy Antibiotic Note  Lawrence Moore is a 66 y.o. male  s/p L4-5 discectomy on 02/24/17 with lumbar abscesss/p aspiration of right L4 laminectomy.   Pharmacy has been consulted for vancomycin dosing. He is also noted on rocephin.  -WBC= 15.6, tmax= 101.6, SCr= 1.08 and CrCl ~ 70  Plan: -Vancomycin 2000mg  IV x1 followed by 1250mg  IV q12h -Will follow renal function, cultures and clinical progress   Height: 5\' 4"  (162.6 cm) Weight: 218 lb 4.8 oz (99 kg) IBW/kg (Calculated) : 59.2  Temp (24hrs), Avg:98.5 F (36.9 C), Min:98 F (36.7 C), Max:99.2 F (37.3 C)   Recent Labs Lab 03/03/2017 1530 02/26/2017 1656 02/27/2017 2041 03/05/17 0329  WBC 13.5*  --   --  15.6*  CREATININE 1.15  --   --  1.08  LATICACIDVEN  --  0.87 1.01  --     Estimated Creatinine Clearance: 73.4 mL/min (by C-G formula based on SCr of 1.08 mg/dL).    Allergies  Allergen Reactions  . Adhesive [Tape] Other (See Comments)    BANDAIDS > REDNESS PAPER TAPE (CAUSES REDNESS & IRRITATION): (03/05/2016)  . Erythromycin Swelling    SWELLING REACTION UNSPECIFIED   . Ibuprofen Other (See Comments)    ABDOMINAL PAIN CRAMPING  . Zocor [Simvastatin] Other (See Comments)    ELEVATED LFT's  . Hydrocodone Other (See Comments)    UNSPECIFIED REACTION     Antimicrobials this admission: 5/10 vanc 5/10 zosyn  Dose adjustments this admission:   Microbiology results: 5/10 blood x1 5/10 urine  Thank you for allowing pharmacy to be a part of this patient's care.  Hildred Laser, Pharm D 03/05/2017 5:26 PM

## 2017-03-05 NOTE — Plan of Care (Signed)
Patient with recent laminectomy (dc'd 02/27/2017)- admitted 5/10 with lower extremity weakness and back pain. MRI should fluid collection near surgery site with impingement on spinal cord. Neurosurgery consulted> The right L4 laminectomy surgery site was aspirated and fluid collection sent for culture. Aspiration showed rare gram + cocci. Patient on antibiotics with Rocephin and Vancomycin.  Called to bedside when patient found non-responsive with oxygen saturation in 40s. He received Narcan and ultimately returned to close to basline LOC in significant pain. Of note, Mr. Denardo required several minutes of respiratory resuscitation with bagging and was then placed on NRB.   Blood Gas: Ph 7.28 PCO2 53.6 PO2 56 HCO2 24.9 Saturation 87%.  Vital Signs:  Pulse 112, BP 159/71, Respirations 30 and oxygen saturation 100% on NRB  Transferring to Stepdown and will place on Bipap for now.

## 2017-03-05 NOTE — Consult Note (Signed)
Reason for Consult:fever Referring Physician: Tana Coast, r  Lawrence Moore is an 66 y.o. male.  HPI: whom underwent a routine lumbar laminectomy for a disc rupture approximately one week ago. He presented to the [facility] ED febrile. He reports severe pain which started this morning in his back and both lower extremities. There has been no drainage from his wound, nor redness.   Past Medical History:  Diagnosis Date  . Anxiety   . Asthma   . Complication of anesthesia    "woke from anesthia itching" lft knee replacment  . Degenerative joint disease   . Depression   . Diabetes insipidus ([place])   . GERD (gastroesophageal reflux disease)   . Headache    migraines  . Hyperlipidemia   . 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 Hypothyroidism   . 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 Shortness of breath dyspnea   . Tubular adenoma of colon 2004    Past Surgical History:  Procedure Laterality Date  . ABDOMINAL AORTAGRAM N/A 08/31/2012   Procedure: ABDOMINAL AORTAGRAM;  Surgeon: Rosetta Posner, MD;  Location: [facility] CATH LAB;  Service: Cardiovascular;  Laterality: N/A;  . APPENDECTOMY    . BACK SURGERY    . CHOLECYSTECTOMY  2002   Lap. cholecystectomy  . CRANIOTOMY  1964, 1966   Infratentorial exc. of craniopharyngioma  . JOINT REPLACEMENT  2009   Left Knee replacement  . KNEE ARTHROSCOPY  2006   Right Knee  . KNEE ARTHROSCOPY Left 12  . LUMBAR LAMINECTOMY/DECOMPRESSION MICRODISCECTOMY N/A 10/12/2014   Procedure: Lumbar five-Sacral one microdiskectomy;  Surgeon: Ashok Pall, MD;  Location: [place];  Service: Neurosurgery;  Laterality: N/A;  Lumbar five-Sacral one microdiskectomy  . LUMBAR LAMINECTOMY/DECOMPRESSION MICRODISCECTOMY Right 01/10/2016   Procedure: LUMBAR LAMINECTOMY/DECOMPRESSION MICRODISCECTOMY 1 LEVEL;  Surgeon: Ashok Pall, MD;  Location: [place] NEURO ORS;  Service: Neurosurgery;  Laterality: Right;  Right L5S1 microdiskectomy  . LUMBAR LAMINECTOMY/DECOMPRESSION  MICRODISCECTOMY Right 02/24/2017   Procedure: RIGHT LUMBAR FOUR-FIVE MICRODISCECTOMY;  Surgeon: Ashok Pall, MD;  Location: [place];  Service: Neurosurgery;  Laterality: Right;  MICRODISCECTOMY LUMBAR 4- LUMBAR 5 RIGHT  . SPINE SURGERY  1998    Family History  Problem Relation Age of Onset  . Depression Mother   . Hyperlipidemia Mother   . Other Mother        VARICOSE VEINS  . Deep vein thrombosis Father   . Prostate cancer Father   . Depression Sister   . Hyperlipidemia Sister   . Alzheimer's disease Maternal Grandmother   . Healthy Maternal Grandfather   . Healthy Paternal Grandmother   . Stroke Paternal Grandfather     Social History:  reports that he quit smoking about 12 years ago. His smoking use included Cigarettes. He has a 99.00 pack-year smoking history. He has never used smokeless tobacco. He reports that he does not drink alcohol or use drugs.  Allergies:  Allergies  Allergen Reactions  . Adhesive [Tape] Other (See Comments)    BANDAIDS > REDNESS PAPER TAPE (CAUSES REDNESS & IRRITATION): (03/05/2016)  . Erythromycin Swelling    SWELLING REACTION UNSPECIFIED   . Ibuprofen Other (See Comments)    ABDOMINAL PAIN CRAMPING  . Zocor [Simvastatin] Other (See Comments)    ELEVATED LFT's  . Hydrocodone Other (See Comments)    UNSPECIFIED REACTION     Medications: I have reviewed the patient's current medications.  Results for orders placed or performed during the hospital encounter of 03/03/2017 (from  the past 48 hour(s))  Comprehensive metabolic panel     Status: Abnormal   Collection Time: 03/20/2017  3:30 PM  Result Value Ref Range   Sodium 136 135 - 145 mmol/L   Potassium 3.0 (L) 3.5 - 5.1 mmol/L   Chloride 98 (L) 101 - 111 mmol/L   CO2 30 22 - 32 mmol/L   Glucose, Bld 96 65 - 99 mg/dL   BUN 8 6 - 20 mg/dL   Creatinine, Ser 1.15 0.61 - 1.24 mg/dL   Calcium 8.7 (L) 8.9 - 10.3 mg/dL   Total Protein 6.0 (L) 6.5 - 8.1 g/dL   Albumin 3.6 3.5 - 5.0 g/dL   AST 28 15 -  41 U/L   ALT 47 17 - 63 U/L   Alkaline Phosphatase 80 38 - 126 U/L   Total Bilirubin 1.0 0.3 - 1.2 mg/dL   GFR calc non Af Amer >60 >60 mL/min   GFR calc Af Amer >60 >60 mL/min    Comment: (NOTE) The eGFR has been calculated using the CKD EPI equation. This calculation has not been validated in all clinical situations. eGFR's persistently <60 mL/min signify possible Chronic Kidney Disease.    Anion gap 8 5 - 15  CBC with Differential     Status: Abnormal   Collection Time: 03/19/2017  3:30 PM  Result Value Ref Range   WBC 13.5 (H) 4.0 - 10.5 K/uL   RBC 4.64 4.22 - 5.81 MIL/uL   Hemoglobin 13.8 13.0 - 17.0 g/dL   HCT 41.5 39.0 - 52.0 %   MCV 89.4 78.0 - 100.0 fL   MCH 29.7 26.0 - 34.0 pg   MCHC 33.3 30.0 - 36.0 g/dL   RDW 13.1 11.5 - 15.5 %   Platelets 160 150 - 400 K/uL   Neutrophils Relative % 79 %   Neutro Abs 10.5 (H) 1.7 - 7.7 K/uL   Lymphocytes Relative 14 %   Lymphs Abs 1.9 0.7 - 4.0 K/uL   Monocytes Relative 6 %   Monocytes Absolute 0.9 0.1 - 1.0 K/uL   Eosinophils Relative 1 %   Eosinophils Absolute 0.2 0.0 - 0.7 K/uL   Basophils Relative 0 %   Basophils Absolute 0.0 0.0 - 0.1 K/uL  Blood culture (routine x 2)     Status: None (Preliminary result)   Collection Time: 03/09/2017  4:44 PM  Result Value Ref Range   Specimen Description BLOOD RIGHT ANTECUBITAL    Special Requests      BOTTLES DRAWN AEROBIC AND ANAEROBIC Blood Culture adequate volume   Culture NO GROWTH < 24 HOURS    Report Status PENDING   I-Stat CG4 Lactic Acid, ED     Status: None   Collection Time: 03/02/2017  4:56 PM  Result Value Ref Range   Lactic Acid, Venous 0.87 0.5 - 1.9 mmol/L  Sedimentation rate     Status: None   Collection Time: 03/12/2017  6:52 PM  Result Value Ref Range   Sed Rate 3 0 - 16 mm/hr  C-reactive protein     Status: Abnormal   Collection Time: 03/20/2017  6:52 PM  Result Value Ref Range   CRP 1.0 (H) <1.0 mg/dL  Blood culture (routine x 2)     Status: None (Preliminary  result)   Collection Time: 02/25/2017  6:53 PM  Result Value Ref Range   Specimen Description BLOOD RIGHT ANTECUBITAL    Special Requests      BOTTLES DRAWN AEROBIC AND ANAEROBIC Blood Culture adequate  volume   Culture NO GROWTH < 24 HOURS    Report Status PENDING   Urinalysis, Routine w reflex microscopic     Status: None   Collection Time: 03/22/2017  7:02 PM  Result Value Ref Range   Color, Urine YELLOW YELLOW   APPearance CLEAR CLEAR   Specific Gravity, Urine 1.008 1.005 - 1.030   pH 5.0 5.0 - 8.0   Glucose, UA NEGATIVE NEGATIVE mg/dL   Hgb urine dipstick NEGATIVE NEGATIVE   Bilirubin Urine NEGATIVE NEGATIVE   Ketones, ur NEGATIVE NEGATIVE mg/dL   Protein, ur NEGATIVE NEGATIVE mg/dL   Nitrite NEGATIVE NEGATIVE   Leukocytes, UA NEGATIVE NEGATIVE  Urine culture     Status: None   Collection Time: 03/21/2017  7:02 PM  Result Value Ref Range   Specimen Description URINE, RANDOM    Special Requests NONE    Culture NO GROWTH    Report Status 03/05/2017 FINAL   I-Stat CG4 Lactic Acid, ED     Status: None   Collection Time: 03/05/2017  8:41 PM  Result Value Ref Range   Lactic Acid, Venous 1.01 0.5 - 1.9 mmol/L  Magnesium     Status: Abnormal   Collection Time: 03/09/2017 11:24 PM  Result Value Ref Range   Magnesium 1.6 (L) 1.7 - 2.4 mg/dL  HIV antibody (Routine Testing)     Status: None   Collection Time: 03/05/17  3:29 AM  Result Value Ref Range   HIV Screen 4th Generation wRfx Non Reactive Non Reactive    Comment: (NOTE) Performed At: [facility] [place], [place] [id number] Lindon Romp MD OV:[id number]   CBC     Status: Abnormal   Collection Time: 03/05/17  3:29 AM  Result Value Ref Range   WBC 15.6 (H) 4.0 - 10.5 K/uL   RBC 4.32 4.22 - 5.81 MIL/uL   Hemoglobin 12.9 (L) 13.0 - 17.0 g/dL   HCT 39.4 39.0 - 52.0 %   MCV 91.2 78.0 - 100.0 fL   MCH 29.9 26.0 - 34.0 pg   MCHC 32.7 30.0 - 36.0 g/dL   RDW 13.2 11.5 - 15.5 %   Platelets 144 (L) 150 -  400 K/uL  Basic metabolic panel     Status: Abnormal   Collection Time: 03/05/17  3:29 AM  Result Value Ref Range   Sodium 133 (L) 135 - 145 mmol/L   Potassium 4.0 3.5 - 5.1 mmol/L    Comment: DELTA CHECK NOTED   Chloride 97 (L) 101 - 111 mmol/L   CO2 30 22 - 32 mmol/L   Glucose, Bld 99 65 - 99 mg/dL   BUN 10 6 - 20 mg/dL   Creatinine, Ser 1.08 0.61 - 1.24 mg/dL   Calcium 8.2 (L) 8.9 - 10.3 mg/dL   GFR calc non Af Amer >60 >60 mL/min   GFR calc Af Amer >60 >60 mL/min    Comment: (NOTE) The eGFR has been calculated using the CKD EPI equation. This calculation has not been validated in all clinical situations. eGFR's persistently <60 mL/min signify possible Chronic Kidney Disease.    Anion gap 6 5 - 15  Protime-INR     Status: None   Collection Time: 03/05/17  6:41 AM  Result Value Ref Range   Prothrombin Time 14.9 11.4 - 15.2 seconds   INR 1.16   APTT     Status: None   Collection Time: 03/05/17  6:41 AM  Result Value Ref Range   aPTT 32  24 - 36 seconds  Glucose, capillary     Status: Abnormal   Collection Time: 03/05/17  6:55 AM  Result Value Ref Range   Glucose-Capillary 18 (LL) 65 - 99 mg/dL   Comment 1 Notify RN    Comment 2 Document in Chart   Glucose, capillary     Status: None   Collection Time: 03/05/17  6:58 AM  Result Value Ref Range   Glucose-Capillary 83 65 - 99 mg/dL   Comment 1 Notify RN    Comment 2 Document in Chart   Glucose, capillary     Status: None   Collection Time: 03/05/17 11:10 AM  Result Value Ref Range   Glucose-Capillary 74 65 - 99 mg/dL  Aerobic/Anaerobic Culture (surgical/deep wound)     Status: None (Preliminary result)   Collection Time: 03/05/17  4:06 PM  Result Value Ref Range   Specimen Description ABSCESS    Special Requests RIGHT L4 LAMINECTOMY BED    Gram Stain      MODERATE WBC PRESENT, PREDOMINANTLY PMN RARE GRAM POSITIVE COCCI IN PAIRS IN CLUSTERS    Culture PENDING    Report Status PENDING   Glucose, capillary      Status: Abnormal   Collection Time: 03/05/17  4:37 PM  Result Value Ref Range   Glucose-Capillary 57 (L) 65 - 99 mg/dL  Glucose, capillary     Status: None   Collection Time: 03/05/17  5:35 PM  Result Value Ref Range   Glucose-Capillary 78 65 - 99 mg/dL   Comment 1 Notify RN     Dg Chest 2 View  Result Date: 03/05/2017 CLINICAL DATA:  Fever for 2 days. Seven days post lumbar discectomy. EXAM: CHEST  2 VIEW COMPARISON:  Radiographs 04/15/2016 FINDINGS: The cardiomediastinal contours are unchanged. The lungs are clear. Pulmonary vasculature is normal. No consolidation, pleural effusion, or pneumothorax. No acute osseous abnormalities are seen. IMPRESSION: No acute abnormality. Electronically Signed   By: Jeb Levering M.D.   On: 03/06/2017 18:14   Dg Lumbar Spine Complete  Result Date: 03/11/2017 CLINICAL DATA:  Lumbar pain and bilateral lower extremity tingling. Lumbar discectomy 7 days prior. Fever. EXAM: LUMBAR SPINE - COMPLETE 4+ VIEW COMPARISON:  Intraoperative localization lateral views 02/24/2017 FINDINGS: The alignment is maintained. Vertebral body heights are normal. There is no listhesis. The posterior elements are intact. Minimal disc space narrowing at L5-S1. Remaining disc spaces are preserved. No abnormal bone density or bony destruction. No fracture. Laminectomy defects are not well visualized radiographically. Sacroiliac joints are congruent. IMPRESSION: No acute abnormality visualized radiographically. Electronically Signed   By: Jeb Levering M.D.   On: 02/25/2017 18:21   Mr Lumbar Spine W Wo Contrast  Result Date: 03/22/2017 CLINICAL DATA:  Back pain with bilateral lower extremity tingling and numbness, right worse than left. Recent posterior decompression. EXAM: MRI LUMBAR SPINE WITHOUT AND WITH CONTRAST TECHNIQUE: Multiplanar and multiecho pulse sequences of the lumbar spine were obtained without and with intravenous contrast. CONTRAST:  70m MULTIHANCE GADOBENATE  DIMEGLUMINE 529 MG/ML IV SOLN COMPARISON:  Lumbar spine MRI 02/11/2017 FINDINGS: Segmentation:  Standard Alignment:  Normal Vertebrae: Status post right laminectomy recently at L4 and remote right laminectomy at L5. Conus medullaris: Extends to the lower L1 level and appears normal. Paraspinal and other soft tissues: Postsurgical changes in the posterior paraspinal soft tissues. There is a fluid collection in the right L4 laminectomy bed that measures 3.1 cm AP by 1.4 cm TV by 3.5 cm CC. Severe atrophy of the  right psoas muscle. Multiple bilateral renal cysts. Disc levels: T12-L1: Disc degeneration without stenosis. L1-L2: Disc desiccation with minimal bulge.  No stenosis. L2-L3: Disc desiccation and small bulge.  No stenosis. L3-L4:  Disc desiccation and medium-sized bulge.  No stenosis. L4-L5: Recent right L4 laminectomy with fluid collection in the operative bed. There is severe narrowing of the thecal sac, worse than the prior study. The Previously seen central disc protrusion has decreased. There is mild left neural foraminal stenosis. L5-S1: Unchanged central disc protrusion. Distortion of the right lateral recess secondary to granulation tissue is unchanged. There is a small amounts of fluid tracking along the spinous process. Mild bilateral foraminal stenosis is unchanged. Visualized sacrum: Normal. IMPRESSION: 1. Status post right L4 laminectomy with fluid collection in the operative bed causing mass effect on the dorsal aspect of the thecal sac with worsened narrowing of thecal sac compared to the preoperative study. 2. Unchanged distortion of the right lateral recess at L5-S1 secondary to the presence of granulation tissue. Electronically Signed   By: Ulyses Jarred M.D.   On: 03/21/2017 20:34   Ct Aspiration  Result Date: 03/05/2017 INDICATION: 66 year old male with a history of recent right L4 laminectomy complicated by pain and fluid collection in the operative bed. He presents for aspiration. EXAM:  CT-guided aspiration MEDICATIONS: None for this procedure ANESTHESIA/SEDATION: Fentanyl 50 mcg IV; Versed 2 mg IV Moderate Sedation Time:  14 minutes The patient was continuously monitored during the procedure by the interventional radiology nurse under my direct supervision. COMPLICATIONS: None immediate. PROCEDURE: Informed written consent was obtained from the patient after a thorough discussion of the procedural risks, benefits and alternatives. All questions were addressed. A timeout was performed prior to the initiation of the procedure. Planning axial CT was performed and the right L4 laminectomy site identified. The overlying skin was marked and then sterilely prepped and draped in standard fashion using chlorhexidine skin prep. Following local anesthesia with 1% lidocaine, an 18 gauge trocar needle was advanced and positions in the laminectomy surgical bed using intermittent CT guidance. Aspiration yields 5 mL turbid yellow fluid with small internal debris. The fluid was sent for culture. The needle was removed and a Band-Aid applied. IMPRESSION: Technically successful CT-guided aspiration of the right L4 laminectomy surgical bed yielding 5 mL turbid yellow fluid which was sent for culture. Signed, Criselda Peaches, MD [facility] Electronically Signed   By: Jacqulynn Cadet M.D.   On: 03/05/2017 17:16    ROS Blood pressure 103/62, pulse 68, temperature 98.7 F (37.1 C), resp. rate 20, height 5' 4"  (1.626 m), weight 99 kg (218 lb 4.8 oz), SpO2 92 %. Physical Exam  Constitutional: He is oriented to person, place, and time. He appears well-developed and well-nourished.  HENT:  Head: Normocephalic and atraumatic.  Right Ear: External ear normal.  Left Ear: External ear normal.  Eyes: Conjunctivae and EOM are normal. Pupils are equal, round, and reactive to light.  Neck: Normal range of motion. Neck supple.  Cardiovascular: Normal  rate, regular rhythm, normal heart sounds and intact distal pulses.   Respiratory: Effort normal and breath sounds normal.  GI: Soft. Bowel sounds are normal.  Musculoskeletal: Normal range of motion. He exhibits edema. He exhibits no tenderness.  Neurological: He is alert and oriented to person, place, and time. He has normal strength. He displays abnormal reflex. No cranial nerve deficit. He exhibits abnormal muscle tone. Coordination normal. He displays Babinski's sign on the right side.  He displays Babinski's sign on the left side.  Skin: Skin is warm and dry.  Psychiatric: He has a normal mood and affect. His behavior is normal. Judgment and thought content normal.    Assessment/Plan: Fever unknown etiology. Although the aspirate had organisms this still not indicative of a abscess. I would expect a surgical bed to have rare organisms. The wound is pristine. Agree with antibiotics. Will not explore wound at this point. Sed rate is only 3. Will follow.   Ailany Koren L 03/05/2017, 7:21 PM

## 2017-03-05 NOTE — Progress Notes (Addendum)
[facility]                                                                              Patient Demographics  Lawrence Moore, is a 66 y.o. male, DOB - Apr 22, 1951, SPQ:[id number]  Admit date - 03/17/2017   Admitting Physician Edwin Dada, MD  Outpatient Primary MD for the patient is Golden Circle, FNP  Outpatient specialists:   LOS - 1  days    No chief complaint on file.      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 Patient had a right L4-5 microdiscectomy on 5/2, had a normal post-op course and was discharged 5/5.  Since then, he had been doing better each day, walking, having less and less pain, no wound drainage or other symptoms. However on 5/9 he started having severe back pain, he felt leg numbness, worse on the RIGHT, leg weakness, "like my back was disconnected".  He tried his home oxycodone without any relief, and the pain progressively worsened all day. He reported subjective fevers, vomiting, or aggressive numbness and weakness in his legs hence he presented to the ED for further evaluation. WBCs 13.5 K. MRI spine showed fluid collection with some impingement on the spinal cord causing mass effect on the dorsal aspect of the thecal sac with worsened with narrowing of the thecal sac compared to the preoperative study, neurosurgery was consulted who recommended admission to medicine.  Assessment & Plan    Principal Problem:   Epidural abscess - Suspect this is due to postoperative infection, chest x-ray clear, no pneumonia, no UTI. - MRI spine showed a fluid collection with some impingement on the spinal cord causing mass effect on the dorsal aspect of the thecal sac - Continue IV fluids, currently NPO for IR aspiration for the fluid cultures - Patient will be started on IV antibiotics after  the aspiration. - Neurosurgery consulted, Dr. Cyndy Freeze to see - ID consulted, discussed with Dr. Johnnye Sima - Follow blood cultures, continue pain control  Active Problems:   Hypopituitarism ([place]) - Continue levothyroxine, DDAVP - Continue stress dose prednisone - not on testosterone or GH    Depression - Continue Seroquel, Prozac    COPD (chronic obstructive pulmonary disease) ([place]) - Continue albuterol as needed, currently stable, no wheezing    Hypokalemia - Recheck BMET  Code Status: Full code  DVT Prophylaxis:  SCD's Family Communication: Discussed in detail with the patient, all imaging results, lab results explained to the patient   Disposition Plan:   Time Spent in minutes  25 minutes  Procedures:    Consultants:   Interventional radiology Neurosurgery  Antimicrobials:      Medications  Scheduled Meds: . celecoxib  200 mg Oral BID  . desmopressin  0.1 mg Oral Daily  . desmopressin  0.15 mg Oral QHS  . FLUoxetine  40 mg Oral Daily  . gabapentin  600 mg Oral QHS  . levothyroxine  150 mcg Oral QAC breakfast  . pantoprazole  40 mg Oral Daily  . pravastatin  40 mg Oral QHS  . predniSONE  10 mg Oral Q breakfast  . QUEtiapine  50 mg Oral QHS   Continuous Infusions: . sodium chloride 100 mL/hr at 03/02/2017 2332   PRN Meds:.acetaminophen **OR** acetaminophen, morphine injection, ondansetron **OR** ondansetron (ZOFRAN) IV, oxyCODONE   Antibiotics   Anti-infectives    None        Subjective:   Lawrence Moore was seen and examined today.  Continues to complain of back pain radiating to his legs, 7 out of 10, sharp and shooting down. No fevers or chills.  Patient denies dizziness, chest pain, shortness of breath, abdominal pain, N/V/D/C.  Objective:   Vitals:   02/23/2017 2234 03/05/17 0127 03/05/17 0522 03/05/17 0906  BP: 101/85 111/66 (!) 124/59 (!) 113/51  Pulse: 71 74 80 84  Resp: 20 20 20 20   Temp: 98.1 F (36.7 C) 98 F (36.7 C) 99.2 F  (37.3 C) 98.7 F (37.1 C)  TempSrc: Oral Oral Oral Oral  SpO2: 96% 95% 93% 94%  Weight: 99 kg (218 lb 4.8 oz)     Height: 5\' 4"  (1.626 m)       Intake/Output Summary (Last 24 hours) at 03/05/17 1054 Last data filed at 03/05/17 0321  Gross per 24 hour  Intake             1880 ml  Output                0 ml  Net             1880 ml     Wt Readings from Last 3 Encounters:  02/28/2017 99 kg (218 lb 4.8 oz)  02/24/17 108.2 kg (238 lb 8.6 oz)  02/22/17 97.1 kg (214 lb)     Exam  General: Alert and oriented x 3, NAD, Uncomfortable due to pain   HEENT:  PERRLA, EOMI, Anicteric Sclera, mucous membranes moist.   Neck: Supple, no JVD, no masses  Cardiovascular: S1 S2 auscultated, no rubs, murmurs or gallops. Regular rate and rhythm.  Respiratory: Clear to auscultation bilaterally, no wheezing, rales or rhonchi  Gastrointestinal: Soft, nontender, nondistended, + bowel sounds  Ext: no cyanosis clubbing or edema  Neuro: AAOx3, Cr N's II- XII. Sensation diminished in both legs, strength limited by pain in the legs   Skin:Back incision CDI   Psych: Normal affect and demeanor, alert and oriented x3    Data Reviewed:  I have personally reviewed following labs and imaging studies  Micro Results Recent Results (from the past 240 hour(s))  Blood culture (routine x 2)     Status: None (Preliminary result)   Collection Time: 02/28/2017  4:44 PM  Result Value Ref Range Status   Specimen Description BLOOD RIGHT ANTECUBITAL  Final   Special Requests   Final    BOTTLES DRAWN AEROBIC AND ANAEROBIC Blood Culture adequate volume   Culture NO GROWTH < 24 HOURS  Final   Report Status PENDING  Incomplete  Blood culture (routine x 2)     Status: None (Preliminary result)   Collection Time: 03/05/2017  6:53 PM  Result Value Ref Range Status   Specimen Description BLOOD RIGHT ANTECUBITAL  Final   Special Requests   Final    BOTTLES DRAWN AEROBIC AND ANAEROBIC Blood Culture adequate volume    Culture NO GROWTH < 24 HOURS  Final   Report Status PENDING  Incomplete    Radiology Reports Dg Chest 2 View  Result Date: 03/02/2017 CLINICAL DATA:  Fever  for 2 days. Seven days post lumbar discectomy. EXAM: CHEST  2 VIEW COMPARISON:  Radiographs 04/15/2016 FINDINGS: The cardiomediastinal contours are unchanged. The lungs are clear. Pulmonary vasculature is normal. No consolidation, pleural effusion, or pneumothorax. No acute osseous abnormalities are seen. IMPRESSION: No acute abnormality. Electronically Signed   By: Jeb Levering M.D.   On: 03/25/2017 18:14   Dg Lumbar Spine 2-3 Views  Result Date: 02/24/2017 CLINICAL DATA:  MICRODISCECTOMY LUMBAR 4- LUMBAR 5 RIGHT. Radiation Safety Timeout performed by BIW. EXAM: LUMBAR SPINE - 2-3 VIEW COMPARISON:  02/11/2017 MRI FINDINGS: A surgical instrument is used for localization purposes and overlies the spinous process of L4, posterior to the disc space at L4-5. IMPRESSION: Intraoperative localization. Electronically Signed   By: Nolon Nations M.D.   On: 02/24/2017 15:20   Dg Lumbar Spine Complete  Result Date: 03/17/2017 CLINICAL DATA:  Lumbar pain and bilateral lower extremity tingling. Lumbar discectomy 7 days prior. Fever. EXAM: LUMBAR SPINE - COMPLETE 4+ VIEW COMPARISON:  Intraoperative localization lateral views 02/24/2017 FINDINGS: The alignment is maintained. Vertebral body heights are normal. There is no listhesis. The posterior elements are intact. Minimal disc space narrowing at L5-S1. Remaining disc spaces are preserved. No abnormal bone density or bony destruction. No fracture. Laminectomy defects are not well visualized radiographically. Sacroiliac joints are congruent. IMPRESSION: No acute abnormality visualized radiographically. Electronically Signed   By: Jeb Levering M.D.   On: 03/08/2017 18:21   Mr Lumbar Spine W Wo Contrast  Result Date: 03/19/2017 CLINICAL DATA:  Back pain with bilateral lower extremity tingling and  numbness, right worse than left. Recent posterior decompression. EXAM: MRI LUMBAR SPINE WITHOUT AND WITH CONTRAST TECHNIQUE: Multiplanar and multiecho pulse sequences of the lumbar spine were obtained without and with intravenous contrast. CONTRAST:  43mL MULTIHANCE GADOBENATE DIMEGLUMINE 529 MG/ML IV SOLN COMPARISON:  Lumbar spine MRI 02/11/2017 FINDINGS: Segmentation:  Standard Alignment:  Normal Vertebrae: Status post right laminectomy recently at L4 and remote right laminectomy at L5. Conus medullaris: Extends to the lower L1 level and appears normal. Paraspinal and other soft tissues: Postsurgical changes in the posterior paraspinal soft tissues. There is a fluid collection in the right L4 laminectomy bed that measures 3.1 cm AP by 1.4 cm TV by 3.5 cm CC. Severe atrophy of the right psoas muscle. Multiple bilateral renal cysts. Disc levels: T12-L1: Disc degeneration without stenosis. L1-L2: Disc desiccation with minimal bulge.  No stenosis. L2-L3: Disc desiccation and small bulge.  No stenosis. L3-L4:  Disc desiccation and medium-sized bulge.  No stenosis. L4-L5: Recent right L4 laminectomy with fluid collection in the operative bed. There is severe narrowing of the thecal sac, worse than the prior study. The Previously seen central disc protrusion has decreased. There is mild left neural foraminal stenosis. L5-S1: Unchanged central disc protrusion. Distortion of the right lateral recess secondary to granulation tissue is unchanged. There is a small amounts of fluid tracking along the spinous process. Mild bilateral foraminal stenosis is unchanged. Visualized sacrum: Normal. IMPRESSION: 1. Status post right L4 laminectomy with fluid collection in the operative bed causing mass effect on the dorsal aspect of the thecal sac with worsened narrowing of thecal sac compared to the preoperative study. 2. Unchanged distortion of the right lateral recess at L5-S1 secondary to the presence of granulation tissue.  Electronically Signed   By: Ulyses Jarred M.D.   On: 03/01/2017 20:34   Mr Lumbar Spine W Wo Contrast  Result Date: 02/12/2017 CLINICAL DATA:  66 year old male with lumbar  pain radiating down the right leg to the foot for 2 months. Prior surgery, most recently in March 2017. EXAM: MRI LUMBAR SPINE WITHOUT AND WITH CONTRAST TECHNIQUE: Multiplanar and multiecho pulse sequences of the lumbar spine were obtained without and with intravenous contrast. CONTRAST:  42mL MULTIHANCE GADOBENATE DIMEGLUMINE 529 MG/ML IV SOLN COMPARISON:  Lumbar MRI 12/12/2015, and earlier FINDINGS: Segmentation: Normal, the same numbering system used on the prior MRIs. Alignment: Chronic straightening of lumbar lordosis. Stable vertebral height and alignment. Vertebrae: No marrow edema or evidence of acute osseous abnormality. Visualized bone marrow signal is within normal limits. Negative visible sacrum. Conus medullaris: Extends to the L1-L2 level and appears normal. No abnormal intradural enhancement. Paraspinal and other soft tissues: Negative; mild postoperative changes to the posterior paraspinal soft tissues at L4 and L5. Disc levels: T10-T11: Mild disc bulge.  Mild facet hypertrophy. T11-T12:  Stable mild broad-based posterior disc bulge. T12-L1: Stable circumferential disc bulge with broad-based posterior component resulting in borderline to mild spinal stenosis (series 3, image 7). L1-L2:  Stable mild mostly far lateral disc bulge.  No stenosis. L2-L3: Stable mild circumferential disc bulge and endplate spurring. No stenosis. L3-L4: Previous right laminectomy. Stable mild circumferential disc bulge with broad-based posterior component. Stable mild endplate spurring. Stable mild left facet hypertrophy. No stenosis. L4-L5: Chronic circumferential disc bulge. Interval increased central to slightly right paracentral broad-based disc protrusion (series 7, image 21). Increased mass effect on the ventral thecal sac. Stable mild facet and  ligament flavum hypertrophy. Stable mild epidural lipomatosis. Increased mild to moderate spinal stenosis. Disc material in proximity to the descending right L5 nerve roots in the lateral recess on series 7, image 22. Mild endplate spurring. Stable mild left L4 foraminal stenosis. L5-S1: Postoperative changes on the right including laminectomy and partial discectomy at the right lateral recess. Architectural distortion at the right lateral recess is homogeneous [place] enhancing suggesting granulation tissue despite an area of T2 heterogeneity adjacent to the descending right S1 nerve roots. See series 10, image 28 and series 7, image 27. Underlying circumferential disc bulge and endplate spurring with broad-based posterior component. Moderate chronic facet hypertrophy. No spinal or definite lateral recess stenosis. Mild to moderate right L5 foraminal stenosis in part due to endplate spurring has not significantly changed. IMPRESSION: 1. Since the prior MRI in February 2017 a broad-based L4-L5 disc protrusion has progressed along with mild to moderate spinal stenosis at that level, and this could be a source for right L5 radiculitis. 2. Postoperative changes on the right at L5-S1 with right lateral recess architectural distortion felt due to granulation tissue. No spinal or convincing lateral recess stenosis at that level. There is mild to moderate multifactorial right L5 foraminal stenosis. 3. Other lumbar levels are stable without stenosis. 4. Chronic mild spinal stenosis at T12-L1 related to stable broad-based disc protrusion. Electronically Signed   By: Genevie Ann M.D.   On: 02/12/2017 08:17    Lab Data:  CBC:  Recent Labs Lab 03/21/2017 1530 03/05/17 0329  WBC 13.5* 15.6*  NEUTROABS 10.5*  --   HGB 13.8 12.9*  HCT 41.5 39.4  MCV 89.4 91.2  PLT 160 810*   Basic Metabolic Panel:  Recent Labs Lab 02/24/2017 1530 03/25/2017 2324 03/05/17 0329  NA 136  --  133*  K 3.0*  --  4.0  CL 98*  --  97*  CO2  30  --  30  GLUCOSE 96  --  99  BUN 8  --  10  CREATININE 1.15  --  1.08  CALCIUM 8.7*  --  8.2*  MG  --  1.6*  --    GFR: Estimated Creatinine Clearance: 73.4 mL/min (by C-G formula based on SCr of 1.08 mg/dL). Liver Function Tests:  Recent Labs Lab 02/26/2017 1530  AST 28  ALT 47  ALKPHOS 80  BILITOT 1.0  PROT 6.0*  ALBUMIN 3.6   No results for input(s): LIPASE, AMYLASE in the last 168 hours. No results for input(s): AMMONIA in the last 168 hours. Coagulation Profile:  Recent Labs Lab 03/05/17 0641  INR 1.16   Cardiac Enzymes: No results for input(s): CKTOTAL, CKMB, CKMBINDEX, TROPONINI in the last 168 hours. BNP (last 3 results) No results for input(s): PROBNP in the last 8760 hours. HbA1C: No results for input(s): HGBA1C in the last 72 hours. CBG:  Recent Labs Lab 03/05/17 0655 03/05/17 0658  GLUCAP 18* 83   Lipid Profile: No results for input(s): CHOL, HDL, LDLCALC, TRIG, CHOLHDL, LDLDIRECT in the last 72 hours. Thyroid Function Tests: No results for input(s): TSH, T4TOTAL, FREET4, T3FREE, THYROIDAB in the last 72 hours. Anemia Panel: No results for input(s): VITAMINB12, FOLATE, FERRITIN, TIBC, IRON, RETICCTPCT in the last 72 hours. Urine analysis:    Component Value Date/Time   COLORURINE YELLOW 03/14/2017 1902   APPEARANCEUR CLEAR 02/28/2017 1902   LABSPEC 1.008 03/03/2017 1902   PHURINE 5.0 03/17/2017 1902   GLUCOSEU NEGATIVE 03/01/2017 1902   HGBUR NEGATIVE 03/12/2017 1902   BILIRUBINUR NEGATIVE 03/16/2017 1902   KETONESUR NEGATIVE 03/20/2017 1902   PROTEINUR NEGATIVE 03/11/2017 1902   UROBILINOGEN 0.2 11/28/2007 0846   NITRITE NEGATIVE 03/01/2017 1902   LEUKOCYTESUR NEGATIVE 03/01/2017 1902     Ripudeep Rai M.D. [facility] 03/05/2017, 10:54 AM  Pager: [phone] Between 7am to 7pm - call Pager - [phone]  After 7pm go to [email or web address] - password [email or web address]  Call night coverage person covering after 7pm

## 2017-03-05 NOTE — Progress Notes (Signed)
Placed pt on BiPAP at this time. Pt tolerating well. Rt to continue to monitor as needed

## 2017-03-05 NOTE — Consult Note (Signed)
Chief Complaint: Patient was seen in consultation today for L4-5 paraspinal abscess aspiration at the request of Dr Archie Patten  Referring Physician(s): Dr Jackqulyn Livings Dr Archie Patten  Supervising Physician: Jacqulynn Cadet  Patient Status: [name] - In-pt  History of Present Illness: Lawrence Moore is a 66 y.o. 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 Status post right L4 laminectomy with fluid collection in the operative bed causing mass effect on the dorsal aspect of the thecal sac with worsened narrowing of thecal sac compared to the preoperative study. 2. Unchanged distortion of the right lateral recess at L5-S1 secondary to the presence of granulation tissue  Dr Christella Noa recommending aspiration for sample to help direct treatment Dr Laurence Ferrari has reviewed imaging and approves procedure  Past Medical History:  Diagnosis Date  . Anxiety   . Asthma   . Complication of anesthesia    "woke from anesthia itching" lft knee replacment  . Degenerative joint disease   . Depression   . Diabetes insipidus ([place])   . GERD (gastroesophageal reflux disease)   . Headache    migraines  . Hyperlipidemia   . 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 Hypothyroidism   . 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 Shortness of breath dyspnea   . Tubular adenoma of colon 2004    Past Surgical History:  Procedure Laterality Date  . ABDOMINAL AORTAGRAM N/A 08/31/2012   Procedure: ABDOMINAL AORTAGRAM;  Surgeon: Rosetta Posner, MD;  Location: [facility] CATH LAB;  Service: Cardiovascular;  Laterality: N/A;  . APPENDECTOMY    . BACK SURGERY    . CHOLECYSTECTOMY  2002   Lap. cholecystectomy  . CRANIOTOMY  1964, 1966   Infratentorial exc. of craniopharyngioma  . JOINT REPLACEMENT  2009   Left Knee replacement   . KNEE ARTHROSCOPY  2006   Right Knee  . KNEE ARTHROSCOPY Left 12  . LUMBAR LAMINECTOMY/DECOMPRESSION MICRODISCECTOMY N/A 10/12/2014   Procedure: Lumbar five-Sacral one microdiskectomy;  Surgeon: Ashok Pall, MD;  Location: [place];  Service: Neurosurgery;  Laterality: N/A;  Lumbar five-Sacral one microdiskectomy  . LUMBAR LAMINECTOMY/DECOMPRESSION MICRODISCECTOMY Right 01/10/2016   Procedure: LUMBAR LAMINECTOMY/DECOMPRESSION MICRODISCECTOMY 1 LEVEL;  Surgeon: Ashok Pall, MD;  Location: [place] NEURO ORS;  Service: Neurosurgery;  Laterality: Right;  Right L5S1 microdiskectomy  . LUMBAR LAMINECTOMY/DECOMPRESSION MICRODISCECTOMY Right 02/24/2017   Procedure: RIGHT LUMBAR FOUR-FIVE MICRODISCECTOMY;  Surgeon: Ashok Pall, MD;  Location: [place];  Service: Neurosurgery;  Laterality: Right;  MICRODISCECTOMY LUMBAR 4- LUMBAR 5 RIGHT  . SPINE SURGERY  1998    Allergies: Erythromycin; Ibuprofen; Zocor [simvastatin]; Hydrocodone; and Adhesive [tape]  Medications: Prior to Admission medications   Medication Sig Start Date End Date Taking? Authorizing Provider  celecoxib (CELEBREX) 200 MG capsule Take 200 mg by mouth 2 (two) times daily.   Yes [provider]  cholecalciferol (VITAMIN D) 1000 UNITS tablet Take 1,000 Units by mouth daily.   Yes [provider]  desmopressin (DDAVP) 0.1 MG tablet Take 1 tablet by mouth  every morning and 1 and 1/2 tablets by mouth at night 07/15/16  Yes Philemon Kingdom, MD  FLUoxetine (PROZAC) 20 MG capsule Take 40 mg by mouth every morning.   Yes [provider]  gabapentin (NEURONTIN) 300 MG capsule TAKE 2 CAPSULES BY MOUTH AT BEDTIME 02/22/17  Yes Golden Circle, FNP  Ipratropium-Albuterol (COMBIVENT RESPIMAT) 20-100 MCG/ACT AERS respimat Inhale 1 puff into the lungs every 6 (six) hours as needed for wheezing or shortness of breath. 01/04/17  Yes Golden Circle, FNP  levothyroxine (SYNTHROID, LEVOTHROID) 150 MCG tablet TAKE 1 TABLET BY MOUTH   DAILY BEFORE BREAKFAST. 07/15/16  Yes Philemon Kingdom, MD  oxyCODONE (OXY IR/ROXICODONE) 5 MG immediate release tablet Take 5 mg by mouth every 6 (six) hours as needed for severe pain.   Yes [provider]  pantoprazole (PROTONIX) 40 MG tablet Take 1 tablet (40 mg total) by mouth daily. 12/02/16  Yes Golden Circle, FNP  pravastatin (PRAVACHOL) 40 MG tablet TAKE 1 TABLET BY MOUTH AT  BEDTIME 02/22/17  Yes Golden Circle, FNP  predniSONE (DELTASONE) 5 MG tablet Take 1 tablet (5 mg total) by mouth daily. 07/15/16  Yes Philemon Kingdom, MD  QUEtiapine (SEROQUEL) 50 MG tablet Take 50 mg by mouth at bedtime.    Yes [provider]  SUMAtriptan (IMITREX) 100 MG tablet Take 1 tablet by mouth at the onset of a headache and may repeat 1 hour later. 01/04/17  Yes Golden Circle, FNP  DULoxetine (CYMBALTA) 30 MG capsule Take 1 capsule daily for 1 week and than twice daily Patient not taking: Reported on 03/19/2017 02/23/17   Arfeen, Arlyce Harman, MD     Family History  Problem Relation Age of Onset  . Depression Mother   . Hyperlipidemia Mother   . Other Mother        VARICOSE VEINS  . Deep vein thrombosis Father   . Prostate cancer Father   . Depression Sister   . Hyperlipidemia Sister   . Alzheimer's disease Maternal Grandmother   . Healthy Maternal Grandfather   . Healthy Paternal Grandmother   . Stroke Paternal Grandfather     Social History   Social History  . Marital status: Single    Spouse name: N/A  . Number of children: 0  . Years of education: 14   Occupational History  . Retired    Social History Main Topics  . Smoking status: Former Smoker    Packs/day: 3.00    Years: 33.00    Types: Cigarettes    Quit date: 01/24/2005  . Smokeless tobacco: Never Used  . Alcohol use No     Comment: 2-3 times a year  . Drug use: No  . Sexual activity: Not Currently   Other Topics Concern  . None   Social History Narrative   Fun: Carve steaks    Review of  Systems: A 12 point ROS discussed and pertinent positives are indicated in the HPI above.  All other systems are negative.  Review of Systems  Constitutional: Positive for activity change, fatigue and fever. Negative for appetite change.  Respiratory: Negative for cough and shortness of breath.   Cardiovascular: Negative for chest pain.  Musculoskeletal: Positive for back pain and gait problem.  Neurological: Positive for weakness.  Psychiatric/Behavioral: Negative for behavioral problems and confusion.    Vital Signs: BP (!) 113/51 (BP Location: Right Arm)   Pulse 84   Temp 98.7 F (37.1 C) (Oral)   Resp 20   Ht 5\' 4"  (1.626 m)   Wt 218 lb 4.8 oz (99 kg)   SpO2 94%   BMI 37.47 kg/m   Physical Exam  Constitutional: He is oriented to person, place, and time.  Cardiovascular: Normal rate, regular rhythm and normal heart sounds.  Pulmonary/Chest: Effort normal and breath sounds normal.  Abdominal: Soft. Bowel sounds are normal.  Musculoskeletal: Normal range of motion.  Cannot stand or walk Can move legs to command  Neurological: He is alert and oriented to person, place, and time.  Skin: Skin is warm and dry.  Psychiatric: He has a normal mood and affect. His behavior is normal. Judgment and thought content normal.  Nursing note and vitals reviewed.   Mallampati Score:  MD Evaluation Airway: WNL Heart: WNL Abdomen: WNL Chest/ Lungs: WNL ASA  Classification: 3 Mallampati/Airway Score: Two  Imaging: Dg Chest 2 View  Result Date: 03/10/2017 CLINICAL DATA:  Fever for 2 days. Seven days post lumbar discectomy. EXAM: CHEST  2 VIEW COMPARISON:  Radiographs 04/15/2016 FINDINGS: The cardiomediastinal contours are unchanged. The lungs are clear. Pulmonary vasculature is normal. No consolidation, pleural effusion, or pneumothorax. No acute osseous abnormalities are seen. IMPRESSION: No acute abnormality. Electronically Signed   By: Jeb Levering M.D.   On: 03/07/2017 18:14    Dg Lumbar Spine 2-3 Views  Result Date: 02/24/2017 CLINICAL DATA:  MICRODISCECTOMY LUMBAR 4- LUMBAR 5 RIGHT. Radiation Safety Timeout performed by BIW. EXAM: LUMBAR SPINE - 2-3 VIEW COMPARISON:  02/11/2017 MRI FINDINGS: A surgical instrument is used for localization purposes and overlies the spinous process of L4, posterior to the disc space at L4-5. IMPRESSION: Intraoperative localization. Electronically Signed   By: Nolon Nations M.D.   On: 02/24/2017 15:20   Dg Lumbar Spine Complete  Result Date: 03/07/2017 CLINICAL DATA:  Lumbar pain and bilateral lower extremity tingling. Lumbar discectomy 7 days prior. Fever. EXAM: LUMBAR SPINE - COMPLETE 4+ VIEW COMPARISON:  Intraoperative localization lateral views 02/24/2017 FINDINGS: The alignment is maintained. Vertebral body heights are normal. There is no listhesis. The posterior elements are intact. Minimal disc space narrowing at L5-S1. Remaining disc spaces are preserved. No abnormal bone density or bony destruction. No fracture. Laminectomy defects are not well visualized radiographically. Sacroiliac joints are congruent. IMPRESSION: No acute abnormality visualized radiographically. Electronically Signed   By: Jeb Levering M.D.   On: 02/23/2017 18:21   Mr Lumbar Spine W Wo Contrast  Result Date: 03/23/2017 CLINICAL DATA:  Back pain with bilateral lower extremity tingling and numbness, right worse than left. Recent posterior decompression. EXAM: MRI LUMBAR SPINE WITHOUT AND WITH CONTRAST TECHNIQUE: Multiplanar and multiecho pulse sequences of the lumbar spine were obtained without and with intravenous contrast. CONTRAST:  48mL MULTIHANCE GADOBENATE DIMEGLUMINE 529 MG/ML IV SOLN COMPARISON:  Lumbar spine MRI 02/11/2017 FINDINGS: Segmentation:  Standard Alignment:  Normal Vertebrae: Status post right laminectomy recently at L4 and remote right laminectomy at L5. Conus medullaris: Extends to the lower L1 level and appears normal. Paraspinal and other  soft tissues: Postsurgical changes in the posterior paraspinal soft tissues. There is a fluid collection in the right L4 laminectomy bed that measures 3.1 cm AP by 1.4 cm TV by 3.5 cm CC. Severe atrophy of the right psoas muscle. Multiple bilateral renal cysts. Disc levels: T12-L1: Disc degeneration without stenosis. L1-L2: Disc desiccation with minimal bulge.  No stenosis. L2-L3: Disc desiccation and small bulge.  No stenosis. L3-L4:  Disc desiccation and medium-sized bulge.  No stenosis. L4-L5: Recent right L4 laminectomy with fluid collection in the operative bed. There is severe narrowing of the thecal sac, worse than the prior study. The Previously seen central disc protrusion has decreased. There is mild left neural foraminal stenosis. L5-S1: Unchanged central disc protrusion. Distortion of the right lateral recess secondary to granulation  tissue is unchanged. There is a small amounts of fluid tracking along the spinous process. Mild bilateral foraminal stenosis is unchanged. Visualized sacrum: Normal. IMPRESSION: 1. Status post right L4 laminectomy with fluid collection in the operative bed causing mass effect on the dorsal aspect of the thecal sac with worsened narrowing of thecal sac compared to the preoperative study. 2. Unchanged distortion of the right lateral recess at L5-S1 secondary to the presence of granulation tissue. Electronically Signed   By: Ulyses Jarred M.D.   On: 02/27/2017 20:34   Mr Lumbar Spine W Wo Contrast  Result Date: 02/12/2017 CLINICAL DATA:  66 year old male with lumbar pain radiating down the right leg to the foot for 2 months. Prior surgery, most recently in March 2017. EXAM: MRI LUMBAR SPINE WITHOUT AND WITH CONTRAST TECHNIQUE: Multiplanar and multiecho pulse sequences of the lumbar spine were obtained without and with intravenous contrast. CONTRAST:  54mL MULTIHANCE GADOBENATE DIMEGLUMINE 529 MG/ML IV SOLN COMPARISON:  Lumbar MRI 12/12/2015, and earlier FINDINGS:  Segmentation: Normal, the same numbering system used on the prior MRIs. Alignment: Chronic straightening of lumbar lordosis. Stable vertebral height and alignment. Vertebrae: No marrow edema or evidence of acute osseous abnormality. Visualized bone marrow signal is within normal limits. Negative visible sacrum. Conus medullaris: Extends to the L1-L2 level and appears normal. No abnormal intradural enhancement. Paraspinal and other soft tissues: Negative; mild postoperative changes to the posterior paraspinal soft tissues at L4 and L5. Disc levels: T10-T11: Mild disc bulge.  Mild facet hypertrophy. T11-T12:  Stable mild broad-based posterior disc bulge. T12-L1: Stable circumferential disc bulge with broad-based posterior component resulting in borderline to mild spinal stenosis (series 3, image 7). L1-L2:  Stable mild mostly far lateral disc bulge.  No stenosis. L2-L3: Stable mild circumferential disc bulge and endplate spurring. No stenosis. L3-L4: Previous right laminectomy. Stable mild circumferential disc bulge with broad-based posterior component. Stable mild endplate spurring. Stable mild left facet hypertrophy. No stenosis. L4-L5: Chronic circumferential disc bulge. Interval increased central to slightly right paracentral broad-based disc protrusion (series 7, image 21). Increased mass effect on the ventral thecal sac. Stable mild facet and ligament flavum hypertrophy. Stable mild epidural lipomatosis. Increased mild to moderate spinal stenosis. Disc material in proximity to the descending right L5 nerve roots in the lateral recess on series 7, image 22. Mild endplate spurring. Stable mild left L4 foraminal stenosis. L5-S1: Postoperative changes on the right including laminectomy and partial discectomy at the right lateral recess. Architectural distortion at the right lateral recess is homogeneous Truman [place] enhancing suggesting granulation tissue despite an area of T2 heterogeneity adjacent to the descending right  S1 nerve roots. See series 10, image 28 and series 7, image 27. Underlying circumferential disc bulge and endplate spurring with broad-based posterior component. Moderate chronic facet hypertrophy. No spinal or definite lateral recess stenosis. Mild to moderate right L5 foraminal stenosis in part due to endplate spurring has not significantly changed. IMPRESSION: 1. Since the prior MRI in February 2017 a broad-based L4-L5 disc protrusion has progressed along with mild to moderate spinal stenosis at that level, and this could be a source for right L5 radiculitis. 2. Postoperative changes on the right at L5-S1 with right lateral recess architectural distortion felt due to granulation tissue. No spinal or convincing lateral recess stenosis at that level. There is mild to moderate multifactorial right L5 foraminal stenosis. 3. Other lumbar levels are stable without stenosis. 4. Chronic mild spinal stenosis at T12-L1 related to stable broad-based disc protrusion. Electronically Signed  By: Genevie Ann M.D.   On: 02/12/2017 08:17    Labs:  CBC:  Recent Labs  05/06/16 1100 02/22/17 1337 03/16/2017 1530 03/05/17 0329  WBC 5.5 8.5 13.5* 15.6*  HGB 14.5 14.3 13.8 12.9*  HCT 42.3 41.6 41.5 39.4  PLT 158 161 160 144*    COAGS:  Recent Labs  05/06/16 1100 03/05/17 0641  INR 1.06 1.16  APTT 29 32    BMP:  Recent Labs  05/06/16 1100 01/04/17 1125 02/22/17 1337 03/16/2017 1530 03/05/17 0329  NA 142 144 140 136 133*  K 3.3* 3.5 3.7 3.0* 4.0  CL 105 105 105 98* 97*  CO2 29 32 29 30 30   GLUCOSE 93 101* 110* 96 99  BUN 11 12 10 8 10   CALCIUM 9.1 9.2 9.1 8.7* 8.2*  CREATININE 0.99 0.98 1.10 1.15 1.08  GFRNONAA >60  --  >60 >60 >60  GFRAA >60  --  >60 >60 >60    LIVER FUNCTION TESTS:  Recent Labs  05/06/16 1100 01/04/17 1125 02/22/17 1337 03/17/2017 1530  BILITOT 1.1 0.6 1.1 1.0  AST 31 24 37 28  ALT 39 34 42 47  ALKPHOS 73 77 78 80  PROT 6.2* 6.0 6.0* 6.0*  ALBUMIN 3.8 3.7 3.7 3.6     TUMOR MARKERS: No results for input(s): AFPTM, CEA, CA199, CHROMGRNA in the last 8760 hours.  Assessment and Plan:  Worsening back pain Post L4-5 discectomy 02/24/17 New abscess per MRI Scheduled now for aspiration of same Risks and Benefits discussed with the patient including, but not limited to bleeding, infection, damage to adjacent structures or low yield requiring additional tests. All of the patient's questions were answered, patient is agreeable to proceed. Consent signed and in chart.   Thank you for this interesting consult.  I greatly enjoyed meeting KWESI SANGHA and look forward to participating in their care.  A copy of this report was sent to the requesting provider on this date.  Electronically Signed: Lexey Fletes A 03/05/2017, 9:36 AM   I spent a total of 40 Minutes    in face to face in clinical consultation, greater than 50% of which was counseling/coordinating care for paraspinal abscess aspiration

## 2017-03-06 ENCOUNTER — Inpatient Hospital Stay (HOSPITAL_COMMUNITY): Payer: Medicare Other

## 2017-03-06 ENCOUNTER — Encounter (HOSPITAL_COMMUNITY): Payer: Self-pay | Admitting: Radiology

## 2017-03-06 DIAGNOSIS — M545 Low back pain: Secondary | ICD-10-CM

## 2017-03-06 DIAGNOSIS — R4701 Aphasia: Secondary | ICD-10-CM

## 2017-03-06 LAB — CBC
HCT: 40.3 % (ref 39.0–52.0)
Hemoglobin: 13.6 g/dL (ref 13.0–17.0)
MCH: 30.8 pg (ref 26.0–34.0)
MCHC: 33.7 g/dL (ref 30.0–36.0)
MCV: 91.4 fL (ref 78.0–100.0)
PLATELETS: 152 10*3/uL (ref 150–400)
RBC: 4.41 MIL/uL (ref 4.22–5.81)
RDW: 13.4 % (ref 11.5–15.5)
WBC: 12.4 10*3/uL — ABNORMAL HIGH (ref 4.0–10.5)

## 2017-03-06 LAB — BLOOD GAS, ARTERIAL
ACID-BASE DEFICIT: 2.9 mmol/L — AB (ref 0.0–2.0)
ACID-BASE DEFICIT: 2.3 mmol/L — AB (ref 0.0–2.0)
BICARBONATE: 22.8 mmol/L (ref 20.0–28.0)
BICARBONATE: 24.1 mmol/L (ref 20.0–28.0)
DELIVERY SYSTEMS: POSITIVE
DRAWN BY: 236041
Drawn by: 24485
Expiratory PAP: 8
FIO2: 30
Inspiratory PAP: 14
O2 Content: 5 L/min
O2 Saturation: 96 %
O2 Saturation: 94.2 %
PATIENT TEMPERATURE: 98.6
PATIENT TEMPERATURE: 98.6
PCO2 ART: 58.4 mmHg — AB (ref 32.0–48.0)
PH ART: 7.281 — AB (ref 7.350–7.450)
PO2 ART: 82.4 mmHg — AB (ref 83.0–108.0)
pCO2 arterial: 50.1 mmHg — ABNORMAL HIGH (ref 32.0–48.0)
pH, Arterial: 7.24 — ABNORMAL LOW (ref 7.350–7.450)
pO2, Arterial: 74.4 mmHg — ABNORMAL LOW (ref 83.0–108.0)

## 2017-03-06 LAB — HEPATIC FUNCTION PANEL
ALBUMIN: 2.6 g/dL — AB (ref 3.5–5.0)
ALK PHOS: 106 U/L (ref 38–126)
ALT: 120 U/L — ABNORMAL HIGH (ref 17–63)
AST: 102 U/L — AB (ref 15–41)
BILIRUBIN TOTAL: 2.1 mg/dL — AB (ref 0.3–1.2)
Bilirubin, Direct: 1.2 mg/dL — ABNORMAL HIGH (ref 0.1–0.5)
Indirect Bilirubin: 0.9 mg/dL (ref 0.3–0.9)
TOTAL PROTEIN: 5.4 g/dL — AB (ref 6.5–8.1)

## 2017-03-06 LAB — C DIFFICILE QUICK SCREEN W PCR REFLEX
C DIFFICILE (CDIFF) INTERP: NOT DETECTED
C DIFFICILE (CDIFF) TOXIN: NEGATIVE
C DIFFICLE (CDIFF) ANTIGEN: NEGATIVE

## 2017-03-06 LAB — BASIC METABOLIC PANEL
ANION GAP: 14 (ref 5–15)
Anion gap: 9 (ref 5–15)
BUN: 15 mg/dL (ref 6–20)
BUN: 23 mg/dL — ABNORMAL HIGH (ref 6–20)
CALCIUM: 7.9 mg/dL — AB (ref 8.9–10.3)
CHLORIDE: 96 mmol/L — AB (ref 101–111)
CHLORIDE: 97 mmol/L — AB (ref 101–111)
CO2: 27 mmol/L (ref 22–32)
CO2: 20 mmol/L — ABNORMAL LOW (ref 22–32)
CREATININE: 1.72 mg/dL — AB (ref 0.61–1.24)
Calcium: 8.1 mg/dL — ABNORMAL LOW (ref 8.9–10.3)
Creatinine, Ser: 3.52 mg/dL — ABNORMAL HIGH (ref 0.61–1.24)
GFR calc non Af Amer: 40 mL/min — ABNORMAL LOW (ref >60)
GFR calc non Af Amer: 17 mL/min — ABNORMAL LOW (ref >60)
GFR, EST AFRICAN AMERICAN: 47 mL/min — AB (ref >60)
GFR, EST AFRICAN AMERICAN: 20 mL/min — AB (ref >60)
Glucose, Bld: 83 mg/dL (ref 65–99)
Glucose, Bld: 108 mg/dL — ABNORMAL HIGH (ref 65–99)
Potassium: 3.8 mmol/L (ref 3.5–5.1)
Potassium: 4.7 mmol/L (ref 3.5–5.1)
Sodium: 132 mmol/L — ABNORMAL LOW (ref 135–145)
Sodium: 131 mmol/L — ABNORMAL LOW (ref 135–145)

## 2017-03-06 LAB — AMMONIA: Ammonia: 66 umol/L — ABNORMAL HIGH (ref 9–35)

## 2017-03-06 LAB — LACTIC ACID, PLASMA: Lactic Acid, Venous: 2.2 mmol/L (ref 0.5–1.9)

## 2017-03-06 MED ORDER — NALOXONE HCL 0.4 MG/ML IJ SOLN
INTRAMUSCULAR | Status: AC
  Filled 2017-03-06: qty 1

## 2017-03-06 MED ORDER — WHITE PETROLATUM GEL
Status: AC
  Administered 2017-03-06: 13:00:00
  Filled 2017-03-06: qty 1

## 2017-03-06 MED ORDER — NALOXONE HCL 2 MG/2ML IJ SOSY: 0.2500 mg/h | PREFILLED_SYRINGE | INTRAVENOUS | Status: DC

## 2017-03-06 MED ORDER — HYDROCORTISONE NA SUCCINATE PF 100 MG IJ SOLR
100.0000 mg | Freq: Once | INTRAMUSCULAR | Status: AC
  Administered 2017-03-06: 100 mg via INTRAVENOUS

## 2017-03-06 MED ORDER — VANCOMYCIN HCL IN DEXTROSE 750-5 MG/150ML-% IV SOLN
750.0000 mg | Freq: Two times a day (BID) | INTRAVENOUS | Status: DC
  Administered 2017-03-06: 750 mg via INTRAVENOUS
  Filled 2017-03-06 (×2): qty 150

## 2017-03-06 MED ORDER — HYDROCORTISONE NA SUCCINATE PF 100 MG IJ SOLR
100.0000 mg | Freq: Three times a day (TID) | INTRAMUSCULAR | Status: DC
  Administered 2017-03-06 – 2017-03-07 (×3): 100 mg via INTRAVENOUS
  Filled 2017-03-06 (×5): qty 2

## 2017-03-06 MED ORDER — METHYLPREDNISOLONE SODIUM SUCC 125 MG IJ SOLR
INTRAMUSCULAR | Status: AC
  Filled 2017-03-06: qty 2

## 2017-03-06 MED ORDER — LEVOTHYROXINE SODIUM 100 MCG IV SOLR
75.0000 ug | Freq: Every day | INTRAVENOUS | Status: DC
  Administered 2017-03-07 – 2017-03-15 (×9): 75 ug via INTRAVENOUS
  Filled 2017-03-06 (×9): qty 5

## 2017-03-06 MED ORDER — IOPAMIDOL (ISOVUE-370) INJECTION 76%
INTRAVENOUS | Status: AC
  Administered 2017-03-06: 50 mL
  Filled 2017-03-06: qty 50

## 2017-03-06 MED ORDER — DULOXETINE HCL 30 MG PO CPEP
30.0000 mg | ORAL_CAPSULE | Freq: Every day | ORAL | Status: DC
  Administered 2017-03-06: 30 mg via ORAL
  Filled 2017-03-06 (×2): qty 1

## 2017-03-06 MED ORDER — SODIUM CHLORIDE 0.9 % IV BOLUS (SEPSIS)
2000.0000 mL | Freq: Once | INTRAVENOUS | Status: AC
  Administered 2017-03-06: 2000 mL via INTRAVENOUS

## 2017-03-06 NOTE — Progress Notes (Signed)
Since return from CT patient has experience fluctuations in awareness and orientation. Patient is able to follow some commands at times and has incomprehensible speech at other times. Pupils are round, equal, and reactive during all of my assessments. Patient has not received DDAVP dose for two days due to Bipap, after bladder scanning her Dr. Edd Fabian ordered a foley for urinary retention and has seen patient at bedside. Patient also has a emesis occurrence while wearing bipap. Bipap has been removed and 4L [place] has been placed on patient. Lungs are still clear and there have been no changes is respiratory status or effort. The patient's sister is at bedside and has been updated. Patient is resting comfortably but still complains of back pain. Will continue to monitor patient closely.  Lenna Sciara, RN 11:45 PM 03/06/2017

## 2017-03-06 NOTE — Progress Notes (Addendum)
[facility]                                                                              Patient Demographics  Lawrence Moore, is a 66 y.o. male, DOB - 11-19-1950, NTI:[id number]  Admit date - 03/07/2017   Admitting Physician Edwin Dada, MD  Outpatient Primary MD for the patient is Golden Circle, FNP  Outpatient specialists:   LOS - 2  days    No chief complaint on file.      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 Patient had a right L4-5 microdiscectomy on 5/2, had a normal post-op course and was discharged 5/5.  Since then, he had been doing better each day, walking, having less and less pain, no wound drainage or other symptoms. However on 5/9 he started having severe back pain, he felt leg numbness, worse on the RIGHT, leg weakness, "like my back was disconnected".  He tried his home oxycodone without any relief, and the pain progressively worsened all day. He reported subjective fevers, vomiting, or aggressive numbness and weakness in his legs hence he presented to the ED for further evaluation. WBCs 13.5 K. MRI spine showed fluid collection with some impingement on the spinal cord causing mass effect on the dorsal aspect of the thecal sac with worsened with narrowing of the thecal sac compared to the preoperative study, neurosurgery was consulted who recommended admission to medicine.  Assessment & Plan    Principal Problem:   Epidural abscessStatus post recent discectomy on 5/2 - Suspect this is due to postoperative infection, chest x-ray clear, no pneumonia, no UTI. - MRI spine showed a fluid collection with some impingement on the spinal cord causing mass effect on the dorsal aspect of the thecal sac - Patient was placed on vancomycin and ceftriaxone after the IR aspiration. ID following  closely  - Aspirated CSF Cultures growing GPC in clusters, follow sensitivities  - Blood cultures negative so far  Active Problems: Acute respiratory failure with hypotension - Overnight on 5/11, patient had rapid response obtunded, respiratory failure likely precipitated due to overuse of opioids, patient needed Narcan improved his consciousness and oxygen saturations. Placed on BiPAP, however now alert and awake and continuously asking for pain medications.  - Discontinued prednisone, placed on IV Solu-Cortef 100 mg every 8 hours for hypotension, and aggressive IV fluid hydration - BiPAP weaned off, currently O2 sats 92% on 4 L Addendum: 7:10pm  Called by RN, patient was lethargic confused On exam, noted to be confused, talking gibberish, speech garbled, not following any commands - Order stat ABG, gave Narcan 0.4 mg 1 called rapid response - Also called code stroke, appreciate Dr. Leonel Ramsay for evaluating the patient with me, recommended stat CT head and the angiogram - Verbal report from the respiratory therapist for the ABG with pH 7.2, PCO2 50, will place on BiPAP - Signout given to on coming M.D., Dr Earlean Polka. Dr. Leonel Ramsay and myself discussed with patient's sister/HPOA in detail. Called critical care consult however awaiting response back.  Hypopituitarism ([place]) - Continue levothyroxine, DDAVP - Continue IV hydrocortisone - not on testosterone or GH    Depression - Per patient's sister at the bedside, patient was to stop Prozac today and start Cymbalta  - Discontinued Prozac and start Cymbalta 30 mg daily which will also help with the pain    COPD (chronic obstructive pulmonary disease) (HCC) - Continue albuterol as needed, currently stable, no wheezing    Hypokalemia Resolved   Acute kidney injury -Likely due to #1, Dehydration, sepsis - Placed on aggressive IV fluid hydration, follow BMET closely   Full code  DVT Prophylaxis:  SCD's Family  Communication: Discussed in detail with the patient, all imaging results, lab results explained to the patient and patient's sister at the bedside   Disposition Plan:   Time Spent in minutes  35 minutes  Procedures:   IR aspiration of the epidural abscess on 5/11   Consultants:   Interventional radiology Neurosurgery  infectious disease, Dr. Johnnye Sima  Antimicrobials:    IV vancomycin 5/11 >  IV ceftriaxone 5/11 >   Medications  Scheduled Meds: . celecoxib  200 mg Oral BID  . desmopressin  0.1 mg Oral Daily  . desmopressin  0.15 mg Oral QHS  . DULoxetine  30 mg Oral Daily  . gabapentin  600 mg Oral QHS  . hydrocortisone sod succinate (SOLU-CORTEF) inj  100 mg Intravenous Q8H  . levothyroxine  150 mcg Oral QAC breakfast  . methylPREDNISolone sodium succinate      . pantoprazole  40 mg Oral Daily  . pravastatin  40 mg Oral QHS  . QUEtiapine  50 mg Oral QHS   Continuous Infusions: . sodium chloride 125 mL/hr at 03/06/17 1300  . cefTRIAXone (ROCEPHIN)  IV 2 g (03/05/17 1742)  . vancomycin     PRN Meds:.acetaminophen **OR** acetaminophen, fentaNYL, midazolam, morphine injection, naloxone, ondansetron **OR** ondansetron (ZOFRAN) IV, oxyCODONE   Antibiotics   Anti-infectives    Start     Dose/Rate Route Frequency Ordered Stop   03/06/17 1800  vancomycin (VANCOCIN) IVPB 750 mg/150 ml premix     750 mg 150 mL/hr over 60 Minutes Intravenous Every 12 hours 03/06/17 1227     03/06/17 0600  vancomycin (VANCOCIN) 1,250 mg in sodium chloride 0.9 % 250 mL IVPB  Status:  Discontinued     1,250 mg 166.7 mL/hr over 90 Minutes Intravenous Every 12 hours 03/05/17 1729 03/06/17 1227   03/05/17 1800  vancomycin (VANCOCIN) 2,000 mg in sodium chloride 0.9 % 500 mL IVPB     2,000 mg 250 mL/hr over 120 Minutes Intravenous  Once 03/05/17 1729 03/05/17 2049   03/05/17 1715  cefTRIAXone (ROCEPHIN) 2 g in dextrose 5 % 50 mL IVPB     2 g 100 mL/hr over 30 Minutes Intravenous Every 24 hours  03/05/17 1711          Subjective:   Len Azeez was seen and examined today.  Continues to complain of back pain, night out of 10, having chills, hypotensive at the time of my encounter earlier this morning. On BiPAP. Overnight events noted. Denies any abdominal pain, nausea or vomiting. Still spiking low-grade temperature 100.58F   Objective:   Vitals:   03/06/17 0758 03/06/17 0800 03/06/17 1036 03/06/17 1147  BP: (!) 90/58 (!) 90/58 (!) 111/58 (!) 131/103  Pulse: (!) 112 (!) 109 (!) 142 (!) 103  Resp: (!) 22 20 (!) 21 (!) 21  Temp: (!) 100.7 F (38.2 C)     TempSrc:  Axillary     SpO2: 95% 95% 92% 92%  Weight:      Height:        Intake/Output Summary (Last 24 hours) at 03/06/17 1357 Last data filed at 03/06/17 1300  Gross per 24 hour  Intake          3656.25 ml  Output                2 ml  Net          3654.25 ml     Wt Readings from Last 3 Encounters:  02/25/2017 99 kg (218 lb 4.8 oz)  02/24/17 108.2 kg (238 lb 8.6 oz)  02/22/17 97.1 kg (214 lb)     Exam  General: Alert and oriented, On BiPAP   HEENT:  PERRLA, EOMI, Anicteric Sclera,  Neck: on BiPAP   Cardiovascular: S1 and S2 clear, RRR, tachycardia  Respiratory:CT AB, no wheezing  Gastrointestinal: Nontender, nondistended, NBS   Ext: no cyanosis clubbing or edema  Neuro: able to lift up his legs and arms, on BiPAP, exam limited   Skin:  Psych: anxious, asking for pain medications    Data Reviewed:  I have personally reviewed following labs and imaging studies  Micro Results Recent Results (from the past 240 hour(s))  Blood culture (routine x 2)     Status: None (Preliminary result)   Collection Time: 03/21/2017  4:44 PM  Result Value Ref Range Status   Specimen Description BLOOD RIGHT ANTECUBITAL  Final   Special Requests   Final    BOTTLES DRAWN AEROBIC AND ANAEROBIC Blood Culture adequate volume   Culture NO GROWTH 2 DAYS  Final   Report Status PENDING  Incomplete  Blood culture  (routine x 2)     Status: None (Preliminary result)   Collection Time: 03/07/2017  6:53 PM  Result Value Ref Range Status   Specimen Description BLOOD RIGHT ANTECUBITAL  Final   Special Requests   Final    BOTTLES DRAWN AEROBIC AND ANAEROBIC Blood Culture adequate volume   Culture NO GROWTH 2 DAYS  Final   Report Status PENDING  Incomplete  Urine culture     Status: None   Collection Time: 02/27/2017  7:02 PM  Result Value Ref Range Status   Specimen Description URINE, RANDOM  Final   Special Requests NONE  Final   Culture NO GROWTH  Final   Report Status 03/05/2017 FINAL  Final  Aerobic/Anaerobic Culture (surgical/deep wound)     Status: None (Preliminary result)   Collection Time: 03/05/17  4:06 PM  Result Value Ref Range Status   Specimen Description ABSCESS  Final   Special Requests RIGHT L4 LAMINECTOMY BED  Final   Gram Stain   Final    MODERATE WBC PRESENT, PREDOMINANTLY PMN RARE GRAM POSITIVE COCCI IN PAIRS IN CLUSTERS    Culture CULTURE REINCUBATED FOR BETTER GROWTH  Final   Report Status PENDING  Incomplete    Radiology Reports Dg Chest 2 View  Result Date: 03/10/2017 CLINICAL DATA:  Fever for 2 days. Seven days post lumbar discectomy. EXAM: CHEST  2 VIEW COMPARISON:  Radiographs 04/15/2016 FINDINGS: The cardiomediastinal contours are unchanged. The lungs are clear. Pulmonary vasculature is normal. No consolidation, pleural effusion, or pneumothorax. No acute osseous abnormalities are seen. IMPRESSION: No acute abnormality. Electronically Signed   By: Jeb Levering M.D.   On: 02/28/2017 18:14   Dg Lumbar Spine 2-3 Views  Result Date: 02/24/2017 CLINICAL DATA:  MICRODISCECTOMY LUMBAR 4- LUMBAR  5 RIGHT. Radiation Safety Timeout performed by BIW. EXAM: LUMBAR SPINE - 2-3 VIEW COMPARISON:  02/11/2017 MRI FINDINGS: A surgical instrument is used for localization purposes and overlies the spinous process of L4, posterior to the disc space at L4-5. IMPRESSION: Intraoperative  localization. Electronically Signed   By: Nolon Nations M.D.   On: 02/24/2017 15:20   Dg Lumbar Spine Complete  Result Date: 03/24/2017 CLINICAL DATA:  Lumbar pain and bilateral lower extremity tingling. Lumbar discectomy 7 days prior. Fever. EXAM: LUMBAR SPINE - COMPLETE 4+ VIEW COMPARISON:  Intraoperative localization lateral views 02/24/2017 FINDINGS: The alignment is maintained. Vertebral body heights are normal. There is no listhesis. The posterior elements are intact. Minimal disc space narrowing at L5-S1. Remaining disc spaces are preserved. No abnormal bone density or bony destruction. No fracture. Laminectomy defects are not well visualized radiographically. Sacroiliac joints are congruent. IMPRESSION: No acute abnormality visualized radiographically. Electronically Signed   By: Jeb Levering M.D.   On: 03/14/2017 18:21   Mr Lumbar Spine W Wo Contrast  Result Date: 03/05/2017 CLINICAL DATA:  Back pain with bilateral lower extremity tingling and numbness, right worse than left. Recent posterior decompression. EXAM: MRI LUMBAR SPINE WITHOUT AND WITH CONTRAST TECHNIQUE: Multiplanar and multiecho pulse sequences of the lumbar spine were obtained without and with intravenous contrast. CONTRAST:  70mL MULTIHANCE GADOBENATE DIMEGLUMINE 529 MG/ML IV SOLN COMPARISON:  Lumbar spine MRI 02/11/2017 FINDINGS: Segmentation:  Standard Alignment:  Normal Vertebrae: Status post right laminectomy recently at L4 and remote right laminectomy at L5. Conus medullaris: Extends to the lower L1 level and appears normal. Paraspinal and other soft tissues: Postsurgical changes in the posterior paraspinal soft tissues. There is a fluid collection in the right L4 laminectomy bed that measures 3.1 cm AP by 1.4 cm TV by 3.5 cm CC. Severe atrophy of the right psoas muscle. Multiple bilateral renal cysts. Disc levels: T12-L1: Disc degeneration without stenosis. L1-L2: Disc desiccation with minimal bulge.  No stenosis. L2-L3:  Disc desiccation and small bulge.  No stenosis. L3-L4:  Disc desiccation and medium-sized bulge.  No stenosis. L4-L5: Recent right L4 laminectomy with fluid collection in the operative bed. There is severe narrowing of the thecal sac, worse than the prior study. The Previously seen central disc protrusion has decreased. There is mild left neural foraminal stenosis. L5-S1: Unchanged central disc protrusion. Distortion of the right lateral recess secondary to granulation tissue is unchanged. There is a small amounts of fluid tracking along the spinous process. Mild bilateral foraminal stenosis is unchanged. Visualized sacrum: Normal. IMPRESSION: 1. Status post right L4 laminectomy with fluid collection in the operative bed causing mass effect on the dorsal aspect of the thecal sac with worsened narrowing of thecal sac compared to the preoperative study. 2. Unchanged distortion of the right lateral recess at L5-S1 secondary to the presence of granulation tissue. Electronically Signed   By: Ulyses Jarred M.D.   On: 02/24/2017 20:34   Mr Lumbar Spine W Wo Contrast  Result Date: 02/12/2017 CLINICAL DATA:  66 year old male with lumbar pain radiating down the right leg to the foot for 2 months. Prior surgery, most recently in March 2017. EXAM: MRI LUMBAR SPINE WITHOUT AND WITH CONTRAST TECHNIQUE: Multiplanar and multiecho pulse sequences of the lumbar spine were obtained without and with intravenous contrast. CONTRAST:  62mL MULTIHANCE GADOBENATE DIMEGLUMINE 529 MG/ML IV SOLN COMPARISON:  Lumbar MRI 12/12/2015, and earlier FINDINGS: Segmentation: Normal, the same numbering system used on the prior MRIs. Alignment: Chronic straightening of lumbar lordosis.  Stable vertebral height and alignment. Vertebrae: No marrow edema or evidence of acute osseous abnormality. Visualized bone marrow signal is within normal limits. Negative visible sacrum. Conus medullaris: Extends to the L1-L2 level and appears normal. No abnormal  intradural enhancement. Paraspinal and other soft tissues: Negative; mild postoperative changes to the posterior paraspinal soft tissues at L4 and L5. Disc levels: T10-T11: Mild disc bulge.  Mild facet hypertrophy. T11-T12:  Stable mild broad-based posterior disc bulge. T12-L1: Stable circumferential disc bulge with broad-based posterior component resulting in borderline to mild spinal stenosis (series 3, image 7). L1-L2:  Stable mild mostly far lateral disc bulge.  No stenosis. L2-L3: Stable mild circumferential disc bulge and endplate spurring. No stenosis. L3-L4: Previous right laminectomy. Stable mild circumferential disc bulge with broad-based posterior component. Stable mild endplate spurring. Stable mild left facet hypertrophy. No stenosis. L4-L5: Chronic circumferential disc bulge. Interval increased central to slightly right paracentral broad-based disc protrusion (series 7, image 21). Increased mass effect on the ventral thecal sac. Stable mild facet and ligament flavum hypertrophy. Stable mild epidural lipomatosis. Increased mild to moderate spinal stenosis. Disc material in proximity to the descending right L5 nerve roots in the lateral recess on series 7, image 22. Mild endplate spurring. Stable mild left L4 foraminal stenosis. L5-S1: Postoperative changes on the right including laminectomy and partial discectomy at the right lateral recess. Architectural distortion at the right lateral recess is homogeneous [place] enhancing suggesting granulation tissue despite an area of T2 heterogeneity adjacent to the descending right S1 nerve roots. See series 10, image 28 and series 7, image 27. Underlying circumferential disc bulge and endplate spurring with broad-based posterior component. Moderate chronic facet hypertrophy. No spinal or definite lateral recess stenosis. Mild to moderate right L5 foraminal stenosis in part due to endplate spurring has not significantly changed. IMPRESSION: 1. Since the prior MRI in  February 2017 a broad-based L4-L5 disc protrusion has progressed along with mild to moderate spinal stenosis at that level, and this could be a source for right L5 radiculitis. 2. Postoperative changes on the right at L5-S1 with right lateral recess architectural distortion felt due to granulation tissue. No spinal or convincing lateral recess stenosis at that level. There is mild to moderate multifactorial right L5 foraminal stenosis. 3. Other lumbar levels are stable without stenosis. 4. Chronic mild spinal stenosis at T12-L1 related to stable broad-based disc protrusion. Electronically Signed   By: Genevie Ann M.D.   On: 02/12/2017 08:17   Ct Aspiration  Result Date: 03/05/2017 INDICATION: 66 year old male with a history of recent right L4 laminectomy complicated by pain and fluid collection in the operative bed. He presents for aspiration. EXAM: CT-guided aspiration MEDICATIONS: None for this procedure ANESTHESIA/SEDATION: Fentanyl 50 mcg IV; Versed 2 mg IV Moderate Sedation Time:  14 minutes The patient was continuously monitored during the procedure by the interventional radiology nurse under my direct supervision. COMPLICATIONS: None immediate. PROCEDURE: Informed written consent was obtained from the patient after a thorough discussion of the procedural risks, benefits and alternatives. All questions were addressed. A timeout was performed prior to the initiation of the procedure. Planning axial CT was performed and the right L4 laminectomy site identified. The overlying skin was marked and then sterilely prepped and draped in standard fashion using chlorhexidine skin prep. Following local anesthesia with 1% lidocaine, an 18 gauge trocar needle was advanced and positions in the laminectomy surgical bed using intermittent CT guidance. Aspiration yields 5 mL turbid yellow fluid with small internal debris. The fluid  was sent for culture. The needle was removed and a Band-Aid applied. IMPRESSION: Technically  successful CT-guided aspiration of the right L4 laminectomy surgical bed yielding 5 mL turbid yellow fluid which was sent for culture. Signed, Criselda Peaches, MD [facility] Electronically Signed   By: Jacqulynn Cadet M.D.   On: 03/05/2017 17:16   Dg Chest Port 1 View  Result Date: 03/05/2017 CLINICAL DATA:  Acute onset of fever and respiratory failure. Recent lumbar laminectomy. Initial encounter. EXAM: PORTABLE CHEST 1 VIEW COMPARISON:  Chest radiograph performed 03/22/2017 FINDINGS: Worsening right basilar airspace opacity is compatible with pneumonia. No pleural effusion or pneumothorax is seen. The cardiomediastinal silhouette is normal in size. No acute osseous abnormalities are identified. IMPRESSION: Worsening right-sided pneumonia noted. Electronically Signed   By: Garald Balding M.D.   On: 03/05/2017 23:22    Lab Data:  CBC:  Recent Labs Lab 03/24/2017 1530 03/05/17 0329 03/06/17 0243  WBC 13.5* 15.6* 12.4*  NEUTROABS 10.5*  --   --   HGB 13.8 12.9* 13.6  HCT 41.5 39.4 40.3  MCV 89.4 91.2 91.4  PLT 160 144* 833   Basic Metabolic Panel:  Recent Labs Lab 03/25/2017 1530 03/02/2017 2324 03/05/17 0329 03/06/17 0243  NA 136  --  133* 132*  K 3.0*  --  4.0 3.8  CL 98*  --  97* 96*  CO2 30  --  30 27  GLUCOSE 96  --  99 83  BUN 8  --  10 15  CREATININE 1.15  --  1.08 1.72*  CALCIUM 8.7*  --  8.2* 8.1*  MG  --  1.6*  --   --    GFR: Estimated Creatinine Clearance: 46.1 mL/min (A) (by C-G formula based on SCr of 1.72 mg/dL (H)). Liver Function Tests:  Recent Labs Lab 03/14/2017 1530  AST 28  ALT 47  ALKPHOS 80  BILITOT 1.0  PROT 6.0*  ALBUMIN 3.6   No results for input(s): LIPASE, AMYLASE in the last 168 hours. No results for input(s): AMMONIA in the last 168 hours. Coagulation Profile:  Recent Labs Lab 03/05/17 0641  INR 1.16   Cardiac Enzymes: No results for input(s): CKTOTAL, CKMB,  CKMBINDEX, TROPONINI in the last 168 hours. BNP (last 3 results) No results for input(s): PROBNP in the last 8760 hours. HbA1C: No results for input(s): HGBA1C in the last 72 hours. CBG:  Recent Labs Lab 03/05/17 0655 03/05/17 0658 03/05/17 1110 03/05/17 1637 03/05/17 1735  GLUCAP 18* 83 74 57* 78   Lipid Profile: No results for input(s): CHOL, HDL, LDLCALC, TRIG, CHOLHDL, LDLDIRECT in the last 72 hours. Thyroid Function Tests: No results for input(s): TSH, T4TOTAL, FREET4, T3FREE, THYROIDAB in the last 72 hours. Anemia Panel: No results for input(s): VITAMINB12, FOLATE, FERRITIN, TIBC, IRON, RETICCTPCT in the last 72 hours. Urine analysis:    Component Value Date/Time   COLORURINE YELLOW 03/24/2017 1902   APPEARANCEUR CLEAR 03/19/2017 1902   LABSPEC 1.008 02/28/2017 1902   PHURINE 5.0 03/10/2017 1902   GLUCOSEU NEGATIVE 03/10/2017 1902   HGBUR NEGATIVE 03/20/2017 1902   BILIRUBINUR NEGATIVE 03/05/2017 1902   KETONESUR NEGATIVE 03/03/2017 1902   PROTEINUR NEGATIVE 03/16/2017 1902   UROBILINOGEN 0.2 11/28/2007 0846   NITRITE NEGATIVE 03/11/2017 1902   LEUKOCYTESUR NEGATIVE 02/26/2017 1902     Thuy Atilano M.D. [facility] 03/06/2017, 1:57 PM  Pager: [phone] Between 7am to 7pm - call Pager - [phone]  After 7pm go to [email or web address] -  password [email or web address]  Call night coverage person covering after 7pm

## 2017-03-06 NOTE — Consult Note (Signed)
Neurology Consultation Reason for Consult: Altered mental status Referring Physician: Rai, R  CC: Altered mental status  History is obtained from: Nurse, family  HPI: Lawrence Moore is a 66 y.o. 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 He was in his normal state of health until around 6 PM and then shortly thereafter was noted to be aphasic.  A code stroke was called, unfortunately he has not TPA candidate due to his recent spinal surgery and spinal procedure yesterday. He was taken for stat CT angiogram which did not show any large vessel occlusion which would be amenable to intra-arterial intervention.   LKW: 6 PM tpa given?: no,    ROS:  Unable to obtain due to altered mental status.   Past Medical History:  Diagnosis Date  . Anxiety   . Asthma   . Complication of anesthesia    "woke from anesthia itching" lft knee replacment  . Degenerative joint disease   . Depression   . Diabetes insipidus ([place])   . GERD (gastroesophageal reflux disease)   . Headache    migraines  . Hyperlipidemia   . 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 Hypothyroidism   . 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 Shortness of breath dyspnea   . Tubular adenoma of colon 2004     Family History  Problem Relation Age of Onset  . Depression Mother   . Hyperlipidemia Mother   . Other Mother        VARICOSE VEINS  . Deep vein thrombosis Father   . Prostate cancer Father   . Depression Sister   . Hyperlipidemia Sister   . Alzheimer's disease Maternal Grandmother   . Healthy Maternal Grandfather   . Healthy Paternal Grandmother   . Stroke Paternal Grandfather      Social History:  reports that he quit smoking about 12 years ago. His smoking use included Cigarettes. He has a 99.00 pack-year smoking history. He has never used smokeless tobacco. He reports that he does not drink alcohol or use  drugs.   Exam: Current vital signs: BP (!) 156/67 (BP Location: Right Arm)   Pulse 97   Temp 98.6 F (37 C) (Oral)   Resp 19   Ht 5\' 4"  (1.626 m)   Wt 99 kg (218 lb 4.8 oz)   SpO2 100%   BMI 37.47 kg/m  Vital signs in last 24 hours: Temp:  [98.4 F (36.9 C)-100.7 F (38.2 C)] 98.6 F (37 C) (05/12 1821) Pulse Rate:  [95-142] 97 (05/12 2037) Resp:  [11-30] 19 (05/12 2037) BP: (90-159)/(49-103) 156/67 (05/12 1821) SpO2:  [38 %-100 %] 100 % (05/12 2037) FiO2 (%):  [70 %] 70 % (05/12 0742)   Physical Exam  Constitutional: Appears well-developed and well-nourished.  Psych: Affect appropriate to situation Eyes: No scleral injection HENT: No OP obstrucion Head: Normocephalic.  Cardiovascular: Normal rate and regular rhythm.  Respiratory: Effort normal and breath sounds normal to anterior ascultation GI: Soft.  No distension. There is no tenderness.  Skin: WDI  Neuro: Mental Status: Patient is awake, alert, he is initially aphasic, did follow command to smile, but otherwise was not following commands. Subsequently, he did begin following slightly more commands and said a couple of words. Cranial Nerves: II: He does not blink to threat from either direction, but does orient eyes to visual stimuli in both hemifields. Pupils are equal,  round, and reactive to light.   III,IV, VI: He crosses midline both directions V: Facial sensation is symmetric to temperature VII: Facial movement is symmetric.  VIII: hearing is intact to voice X: Uvula elevates symmetrically XI: Shoulder shrug is symmetric. XII: tongue is midline without atrophy or fasciculations.  Motor: Moves all extremity well and is able to hold them without drift, I do question if he has a mild asterixis. Sensory: He responds to noxious simulation in all 4 extremities Cerebellar: Does not perform, no clear ataxia   I have reviewed labs in epic and the results pertinent to this consultation are: Increasing  creatinine ABG PCO2 of 50  I have reviewed the images obtained:CT head - Old surgical changes  Impression: 66 year old male with acute aphasia. I think it is likely that he has had a stroke, but with his other metabolic arrangements, this is not a certainty. I would favor getting an MRI of his brain in pursuing a stroke workup if positive. Also, would consider metabolic encephalopathy as well. Adrenal insufficiency can cause altered mental status as well, but usually the onset is not quite so abrupt.  Recommendations: 1) MRI brain, stroke workup if positive. 2) ammonia, repeat blood cultures 3) EEG 4) neurology will follow   Roland Rack, MD [facility] [phone]  If 7pm- 7am, please page neurology on call as listed in [place].

## 2017-03-06 NOTE — Progress Notes (Signed)
[facility] Progress Note Patient Name: Lawrence Moore DOB: 1951-08-30 MRN: [id number]   Date of Service  03/06/2017  HPI/Events of Note  Call from hospitalist , patient being altered and lethargic, stat ct head unrevealing.  eICU Interventions  Suspect problem is narcotics and neurontin in setting of AKI. Stop these meds, give narcan, abg showing acute respiratory acidosis, bipap ordered, informed to repeat ABG in an hour.         Sharia Reeve 03/06/2017, 8:06 PM

## 2017-03-06 NOTE — Progress Notes (Addendum)
Pt found to be lethargic, aphasic, and not following commands, different from last normal at [facility]. Paged Dr. Tana Coast. MD ordered stat ABG, 0.4mg  Narcan. Paged Rapid Response. Pt HR increased to 145, EKG done. Code stroke initiated. Pt taken to CT scan on Bi-pap.
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
## 2017-03-06 NOTE — Progress Notes (Signed)
Pt taken off bipap and placed on 4L [place] per MD at bedside. Consuelo Pandy RN

## 2017-03-06 NOTE — Consult Note (Signed)
Name: Lawrence Moore MRN: [id number] DOB: May 31, 1951    LOS: 2  Referring Provider:  Dr. Tana Coast Reason for Referral:  Altered mental status, hypercarbia  PULMONARY / CRITICAL CARE MEDICINE  HPI:  Lawrence Moore is a 66 y/o man with the medical history listed below.  He was admitted to [facility] on 03/09/2017 with a spinal epidural abscess after a surgical procedure.  He has been complaining of pain and had gotten multiple doses of morphine throughout the day.  Around 8pm his nurse noticed that he was less responsive than he had been and was not answering questions appropriately (a similar event happened last night and the patient responded to narcan).  He was given narcan again tonight with some improvement in alertness.  A code stroke was called and head CT was done with no evidence of acute stroke.  He was placed on bipap after and ABG showed a pH of 7.25 and pCO2 of 50.  Of note his creatinine has risen significantly since admission.    Past Medical History:  Diagnosis Date  . Anxiety   . Asthma   . Complication of anesthesia    "woke from anesthia itching" lft knee replacment  . Degenerative joint disease   . Depression   . Diabetes insipidus ([place])   . GERD (gastroesophageal reflux disease)   . Headache    migraines  . Hyperlipidemia   . 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 Hypothyroidism   . 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 Shortness of breath dyspnea   . Tubular adenoma of colon 2004   Past Surgical History:  Procedure Laterality Date  . ABDOMINAL AORTAGRAM N/A 08/31/2012   Procedure: ABDOMINAL AORTAGRAM;  Surgeon: Rosetta Posner, MD;  Location: [facility] C[facility] LAB;  Service: Cardiovascular;  Laterality: N/A;  . APPENDECTOMY    . BACK SURGERY    . CHOLECYSTECTOMY  2002   Lap. cholecystectomy  . CRANIOTOMY  1964, 1966   Infratentorial exc. of craniopharyngioma  . JOINT REPLACEMENT  2009   Left Knee replacement  . KNEE ARTHROSCOPY  2006   Right Knee  . KNEE ARTHROSCOPY  Left 12  . LUMBAR LAMINECTOMY/DECOMPRESSION MICRODISCECTOMY N/A 10/12/2014   Procedure: Lumbar five-Sacral one microdiskectomy;  Surgeon: Ashok Pall, MD;  Location: [facility];  Service: Neurosurgery;  Laterality: N/A;  Lumbar five-Sacral one microdiskectomy  . LUMBAR LAMINECTOMY/DECOMPRESSION MICRODISCECTOMY Right 01/10/2016   Procedure: LUMBAR LAMINECTOMY/DECOMPRESSION MICRODISCECTOMY 1 LEVEL;  Surgeon: Ashok Pall, MD;  Location: [facility] NEURO ORS;  Service: Neurosurgery;  Laterality: Right;  Right L5S1 microdiskectomy  . LUMBAR LAMINECTOMY/DECOMPRESSION MICRODISCECTOMY Right 02/24/2017   Procedure: RIGHT LUMBAR FOUR-FIVE MICRODISCECTOMY;  Surgeon: Ashok Pall, MD;  Location: [place];  Service: Neurosurgery;  Laterality: Right;  MICRODISCECTOMY LUMBAR 4- LUMBAR 5 RIGHT  . [place]   Prior to Admission medications   Medication Sig Start Date End Date Taking? Authorizing Provider  celecoxib (CELEBREX) 200 MG capsule Take 200 mg by mouth 2 (two) times daily.   Yes [provider]  cholecalciferol (VITAMIN D) 1000 UNITS tablet Take 1,000 Units by mouth daily.   Yes [provider]  desmopressin (DDAVP) 0.1 MG tablet Take 1 tablet by mouth  every morning and 1 and 1/2 tablets by mouth at night 07/15/16  Yes Philemon Kingdom, MD  FLUoxetine (PROZAC) 20 MG capsule Take 40 mg by mouth every morning.   Yes [provider]  gabapentin (NEURONTIN) 300 MG capsule TAKE 2 CAPSULES BY MOUTH  AT BEDTIME 02/22/17  Yes Golden Circle, FNP  Ipratropium-Albuterol (COMBIVENT RESPIMAT) 20-100 MCG/ACT AERS respimat Inhale 1 puff into the lungs every 6 (six) hours as needed for wheezing or shortness of breath. 01/04/17  Yes Golden Circle, FNP  levothyroxine (SYNTHROID, LEVOTHROID) 150 MCG tablet TAKE 1 TABLET BY MOUTH  DAILY BEFORE BREAKFAST. 07/15/16  Yes Philemon Kingdom, MD  oxyCODONE (OXY IR/ROXICODONE) 5 MG immediate release tablet Take 5 mg by mouth every 6 (six) hours as needed for  severe pain.   Yes [provider]  pantoprazole (PROTONIX) 40 MG tablet Take 1 tablet (40 mg total) by mouth daily. 12/02/16  Yes Golden Circle, FNP  pravastatin (PRAVACHOL) 40 MG tablet TAKE 1 TABLET BY MOUTH AT  BEDTIME 02/22/17  Yes Golden Circle, FNP  predniSONE (DELTASONE) 5 MG tablet Take 1 tablet (5 mg total) by mouth daily. 07/15/16  Yes Philemon Kingdom, MD  QUEtiapine (SEROQUEL) 50 MG tablet Take 50 mg by mouth at bedtime.    Yes [provider]  SUMAtriptan (IMITREX) 100 MG tablet Take 1 tablet by mouth at the onset of a headache and may repeat 1 hour later. 01/04/17  Yes Golden Circle, FNP  DULoxetine (CYMBALTA) 30 MG capsule Take 1 capsule daily for 1 week and than twice daily Patient not taking: Reported on 03/19/2017 02/23/17   Arfeen, Arlyce Harman, MD   Allergies Allergies  Allergen Reactions  . Adhesive [Tape] Other (See Comments)    BANDAIDS > REDNESS PAPER TAPE (CAUSES REDNESS & IRRITATION): (03/05/2016)  . Erythromycin Swelling    SWELLING REACTION UNSPECIFIED   . Ibuprofen Other (See Comments)    ABDOMINAL PAIN CRAMPING  . Zocor [Simvastatin] Other (See Comments)    ELEVATED LFT's  . Hydrocodone Other (See Comments)    UNSPECIFIED REACTION     Family History Family History  Problem Relation Age of Onset  . Depression Mother   . Hyperlipidemia Mother   . Other Mother        VARICOSE VEINS  . Deep vein thrombosis Father   . Prostate cancer Father   . Depression Sister   . Hyperlipidemia Sister   . Alzheimer's disease Maternal Grandmother   . Healthy Maternal Grandfather   . Healthy Paternal Grandmother   . Stroke Paternal Grandfather    Social History  reports that he quit smoking about 12 years ago. His smoking use included Cigarettes. He has a 99.00 pack-year smoking history. He has never used smokeless tobacco. He reports that he does not drink alcohol or use drugs.  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   Events Since Admission: AMS  Current Status:  Vital Signs: Temp:  [98.3 F (36.8 C)-100.7 F (38.2 C)] 98.3 F (36.8 C) (05/12 2031) Pulse Rate:  [95-142] 95 (05/12 2133) Resp:  [11-26] 13 (05/12 2133) BP: (90-156)/(57-103) 138/75 (05/12 2133) SpO2:  [91 %-100 %] 96 % (05/12 2133) FiO2 (%):  [70 %] 70 % (05/12 0742)  Physical Examination: General:  Obese man laying in bed, bipap in place Neuro:  Alert to person, moves all 4 extremities, follows commands, but speech is not completely sensical HEENT:  PERRL, EOMI, OP clear Neck:  Trachea midline, large neck circumference Cardiovascular:  NRRR, no MRG Lungs:  CTAB, no WRR Abdomen:  Obese, NTND Musculoskeletal:  No joint  abnormalities, no edema Skin:  No rashes or other lesions  Principal Problem:   Epidural abscess Active Problems:   Hypopituitarism (HCC)   Depression   COPD (chronic obstructive pulmonary disease) (HCC)   Hypokalemia   ASSESSMENT AND PLAN  PULMONARY  Recent Labs Lab 03/05/17 2204 03/06/17 1855 03/06/17 2145  PHART 7.289* 7.281* 7.240*  PCO2ART 53.6* 50.1* 58.4*  PO2ART 56.1* 82.4* 74.4*  HCO3 24.9 22.8 24.1  O2SAT 86.2 96.0 94.2    A: Hypercarbia secondary to narcotics P: repeat ABG in 2 hours.     RENAL  Recent Labs Lab 03/02/2017 1530 03/24/2017 2324 03/05/17 0329 03/06/17 0243 03/06/17 2033  NA 136  --  133* 132* 131*  K 3.0*  --  4.0 3.8 4.7  CL 98*  --  97* 96* 97*  CO2 30  --  30 27 20*  BUN 8  --  10 15 23*  CREATININE 1.15  --  1.08 1.72* 3.52*  CALCIUM 8.7*  --  8.2* 8.1* 7.9*  MG  --  1.6*  --   --   --    Intake/Output      05/12 0701 - 05/13 0700   P.O. 60   I.V. (mL/kg) 3781.3 (38.2)   IV Piggyback 300   Total Intake(mL/kg) 4141.3 (41.8)   Stool 4   Total Output 4   Net +4137.3         Foley:  Placed 03/06/17  A:  ARF DI P:   Likely secondary to obstruction >500cc output after foley placed Renally dose all meds 2L NS bolus now Hold DDAVP, follow output q12h BMPs Renal US  GASTROINTESTINAL  Recent Labs Lab 02/26/2017 1530 03/06/17 2033  AST 28 102*  ALT 47 120*  ALKPHOS 80 106  BILITOT 1.0 2.1*  PROT 6.0* 5.4*  ALBUMIN 3.6 2.6*    A:  Increasing AST/ALT/Tbili P:   Liver US  HEMATOLOGIC   INFECTIOUS  Recent Labs Lab 03/19/2017 1530 03/05/17 0329 03/06/17 0243  WBC 13.5* 15.6* 12.4*   Cultures: blood Antibiotics: vanc 5/10---> Ceftriaxone--->  A:  Epidural abscess P:   vanc level pending in am, likely supratherapeutic given renal function  ENDOCRINE  Recent Labs Lab 03/05/17 0655 03/05/17 0658 03/05/17 1110 03/05/17 1637 03/05/17 1735  GLUCAP 18* 83 74 57* 78   A:  Hypopituitarism Hypothyroid DI   P:   Change synthroid to IV while NPO Follow output for DDAVP dosing.  NEUROLOGIC  A:  AMS P:   Code stroke called Needs MRI at some point.   BEST PRACTICE / DISPOSITION Level of Care:  Step down Primary Service:  hospitalist Consultants:  Neuro, pccm Code Status:  full Diet:  NPO DVT Px:  lovenox GI Px:  protonix Skin Integrity:  good Social / Family:  Sister at bedside.  Guy Begin., M.D. Pulmonary and [place] Pager: [phone]  03/06/2017, 10:34 PM

## 2017-03-06 NOTE — Progress Notes (Signed)
Pharmacy Antibiotic Note  Lawrence Moore is a 66 y.o. male  s/p L4-5 discectomy on 02/24/17 with lumbar abscesss/p aspiration of right L4 laminectomy. On vanc per pharmacy and rocephin. Neuro note states wound looks pristine.WBC and 24 hour Tmax down. ESR  1.0 and CRP 3. Deep surgical culture now growing rare GPC in pairs/clusters with final report pending.  Received two doses of vancomycin and creatinine increased from 1.08 to 1.72. Will empirically drop dose of vancomycin and get morning level.   Plan: -Decrease vancomycin to 770m q12h -Will follow renal function, cultures and clinical progress -AM Vanc random   Height: _0  (162.6 cm) Weight: 218 lb 4.8 oz (99 kg) IBW/kg (Calculated) : 59.2  Temp (24hrs), Avg:99.2 F (37.3 C), Min:98.4 F (36.9 C), Max:100.7 F (38.2 C)   Recent Labs Lab 02/25/2017 1530 03/09/2017 1656 02/23/2017 2041 03/05/17 0329 03/06/17 0243  WBC 13.5*  --   --  15.6* 12.4*  CREATININE 1.15  --   --  1.08 1.72*  LATICACIDVEN  --  0.87 1.01  --   --     Estimated Creatinine Clearance: 46.1 mL/min (A) (by C-G formula based on SCr of 1.72 mg/dL (H)).    Allergies  Allergen Reactions  . Adhesive [Tape] Other (See Comments)    BANDAIDS > REDNESS PAPER TAPE (CAUSES REDNESS & IRRITATION): (03/05/2016)  . Erythromycin Swelling    SWELLING REACTION UNSPECIFIED   . Ibuprofen Other (See Comments)    ABDOMINAL PAIN CRAMPING  . Zocor [Simvastatin] Other (See Comments)    ELEVATED LFT's  . Hydrocodone Other (See Comments)    UNSPECIFIED REACTION     Antimicrobials this admission: 5/11 vanc 5/11 ctx  Dose adjustments this admission: 5/12 Vanc 12566mq12h to 75051m12h  Microbiology results: 5/10 blood: NGTD x2 days 5/10 urine: NGF Surgical Cx: Rare GPC in pairs/clusters Surgical PCR: MRSA negative  Thank you for allowing pharmacy to be a part of this patient's care.  JosDierdre HarnessS,Cain SieveharmD Clinical Pharmacy Resident [phone]) 03/06/2017 12:30 PM

## 2017-03-07 ENCOUNTER — Inpatient Hospital Stay (HOSPITAL_COMMUNITY): Payer: Medicare Other

## 2017-03-07 LAB — BLOOD GAS, ARTERIAL
Acid-base deficit: 5.3 mmol/L — ABNORMAL HIGH (ref 0.0–2.0)
Bicarbonate: 21.6 mmol/L (ref 20.0–28.0)
DRAWN BY: 249101
O2 Content: 4 L/min
O2 Saturation: 95.7 %
PATIENT TEMPERATURE: 98.6
pCO2 arterial: 57.7 mmHg — ABNORMAL HIGH (ref 32.0–48.0)
pH, Arterial: 7.197 — CL (ref 7.350–7.450)
pO2, Arterial: 87.1 mmHg (ref 83.0–108.0)

## 2017-03-07 LAB — COMPREHENSIVE METABOLIC PANEL
ALBUMIN: 2.6 g/dL — AB (ref 3.5–5.0)
ALK PHOS: 112 U/L (ref 38–126)
ALT: 108 U/L — AB (ref 17–63)
AST: 82 U/L — ABNORMAL HIGH (ref 15–41)
Anion gap: 12 (ref 5–15)
BUN: 26 mg/dL — AB (ref 6–20)
CALCIUM: 7.8 mg/dL — AB (ref 8.9–10.3)
CO2: 22 mmol/L (ref 22–32)
CREATININE: 4.13 mg/dL — AB (ref 0.61–1.24)
Chloride: 99 mmol/L — ABNORMAL LOW (ref 101–111)
GFR calc Af Amer: 16 mL/min — ABNORMAL LOW (ref >60)
GFR calc non Af Amer: 14 mL/min — ABNORMAL LOW (ref >60)
GLUCOSE: 128 mg/dL — AB (ref 65–99)
Potassium: 4.7 mmol/L (ref 3.5–5.1)
SODIUM: 133 mmol/L — AB (ref 135–145)
Total Bilirubin: 1.1 mg/dL (ref 0.3–1.2)
Total Protein: 5.4 g/dL — ABNORMAL LOW (ref 6.5–8.1)

## 2017-03-07 LAB — CBC
HCT: 35.7 % — ABNORMAL LOW (ref 39.0–52.0)
HEMOGLOBIN: 11.9 g/dL — AB (ref 13.0–17.0)
MCH: 30.5 pg (ref 26.0–34.0)
MCHC: 33.3 g/dL (ref 30.0–36.0)
MCV: 91.5 fL (ref 78.0–100.0)
PLATELETS: 122 10*3/uL — AB (ref 150–400)
RBC: 3.9 MIL/uL — ABNORMAL LOW (ref 4.22–5.81)
RDW: 13.6 % (ref 11.5–15.5)
WBC: 14.1 10*3/uL — ABNORMAL HIGH (ref 4.0–10.5)

## 2017-03-07 LAB — URINALYSIS, ROUTINE W REFLEX MICROSCOPIC
Bilirubin Urine: NEGATIVE
GLUCOSE, UA: 50 mg/dL — AB
Ketones, ur: NEGATIVE mg/dL
Nitrite: NEGATIVE
PROTEIN: 100 mg/dL — AB
Specific Gravity, Urine: 1.021 (ref 1.005–1.030)
pH: 5 (ref 5.0–8.0)

## 2017-03-07 LAB — CBC WITH DIFFERENTIAL/PLATELET
BASOS PCT: 0 %
Basophils Absolute: 0 10*3/uL (ref 0.0–0.1)
EOS ABS: 0 10*3/uL (ref 0.0–0.7)
Eosinophils Relative: 0 %
HCT: 37.3 % — ABNORMAL LOW (ref 39.0–52.0)
Hemoglobin: 12.7 g/dL — ABNORMAL LOW (ref 13.0–17.0)
LYMPHS PCT: 3 %
Lymphs Abs: 0.5 10*3/uL — ABNORMAL LOW (ref 0.7–4.0)
MCH: 31 pg (ref 26.0–34.0)
MCHC: 34 g/dL (ref 30.0–36.0)
MCV: 91 fL (ref 78.0–100.0)
MONO ABS: 0.5 10*3/uL (ref 0.1–1.0)
Monocytes Relative: 3 %
NEUTROS ABS: 15.1 10*3/uL — AB (ref 1.7–7.7)
Neutrophils Relative %: 94 %
PLATELETS: 134 10*3/uL — AB (ref 150–400)
RBC: 4.1 MIL/uL — AB (ref 4.22–5.81)
RDW: 13.1 % (ref 11.5–15.5)
WBC MORPHOLOGY: INCREASED
WBC: 16.1 10*3/uL — AB (ref 4.0–10.5)

## 2017-03-07 LAB — AMMONIA: AMMONIA: 26 umol/L (ref 9–35)

## 2017-03-07 LAB — BASIC METABOLIC PANEL
Anion gap: 11 (ref 5–15)
BUN: 36 mg/dL — AB (ref 6–20)
CHLORIDE: 103 mmol/L (ref 101–111)
CO2: 19 mmol/L — ABNORMAL LOW (ref 22–32)
Calcium: 7.4 mg/dL — ABNORMAL LOW (ref 8.9–10.3)
Creatinine, Ser: 4.95 mg/dL — ABNORMAL HIGH (ref 0.61–1.24)
GFR calc Af Amer: 13 mL/min — ABNORMAL LOW (ref >60)
GFR calc non Af Amer: 11 mL/min — ABNORMAL LOW (ref >60)
GLUCOSE: 108 mg/dL — AB (ref 65–99)
POTASSIUM: 5 mmol/L (ref 3.5–5.1)
SODIUM: 133 mmol/L — AB (ref 135–145)

## 2017-03-07 LAB — T4, FREE: Free T4: 1.06 ng/dL (ref 0.61–1.12)

## 2017-03-07 LAB — LACTIC ACID, PLASMA: LACTIC ACID, VENOUS: 1.7 mmol/L (ref 0.5–1.9)

## 2017-03-07 LAB — VITAMIN B12: Vitamin B-12: 1138 pg/mL — ABNORMAL HIGH (ref 180–914)

## 2017-03-07 LAB — GLUCOSE, CAPILLARY: Glucose-Capillary: 117 mg/dL — ABNORMAL HIGH (ref 65–99)

## 2017-03-07 LAB — VANCOMYCIN, RANDOM: VANCOMYCIN RM: 41

## 2017-03-07 LAB — PROCALCITONIN: PROCALCITONIN: 38.31 ng/mL

## 2017-03-07 LAB — TSH: TSH: <0.01 u[IU]/mL — ABNORMAL LOW (ref 0.350–4.500)

## 2017-03-07 MED ORDER — HEPARIN SODIUM (PORCINE) 5000 UNIT/ML IJ SOLN
5000.0000 [IU] | Freq: Three times a day (TID) | INTRAMUSCULAR | Status: DC
  Administered 2017-03-07 – 2017-03-15 (×24): 5000 [IU] via SUBCUTANEOUS
  Filled 2017-03-07 (×24): qty 1

## 2017-03-07 MED ORDER — LACTULOSE 10 GM/15ML PO SOLN
10.0000 g | Freq: Every day | ORAL | Status: DC
  Filled 2017-03-07: qty 15

## 2017-03-07 MED ORDER — LABETALOL HCL 5 MG/ML IV SOLN
10.0000 mg | INTRAVENOUS | Status: DC | PRN
  Administered 2017-03-07 – 2017-03-08 (×5): 10 mg via INTRAVENOUS
  Filled 2017-03-07 (×5): qty 4

## 2017-03-07 MED ORDER — THIAMINE HCL 100 MG/ML IJ SOLN
100.0000 mg | Freq: Every day | INTRAMUSCULAR | Status: DC
  Administered 2017-03-08 – 2017-03-15 (×8): 100 mg via INTRAVENOUS
  Filled 2017-03-07 (×8): qty 1

## 2017-03-07 MED ORDER — HEPARIN SODIUM (PORCINE) 5000 UNIT/ML IJ SOLN
5000.0000 [IU] | Freq: Three times a day (TID) | INTRAMUSCULAR | Status: DC
  Filled 2017-03-07: qty 1

## 2017-03-07 MED ORDER — SODIUM CHLORIDE 0.9 % IV BOLUS (SEPSIS)
250.0000 mL | Freq: Once | INTRAVENOUS | Status: AC
  Administered 2017-03-07: 250 mL via INTRAVENOUS

## 2017-03-07 MED ORDER — HALOPERIDOL LACTATE 5 MG/ML IJ SOLN
2.0000 mg | Freq: Four times a day (QID) | INTRAMUSCULAR | Status: DC | PRN
  Administered 2017-03-07 – 2017-03-14 (×6): 2 mg via INTRAVENOUS
  Filled 2017-03-07 (×6): qty 1

## 2017-03-07 MED ORDER — HYDROCORTISONE NA SUCCINATE PF 100 MG IJ SOLR
25.0000 mg | Freq: Two times a day (BID) | INTRAMUSCULAR | Status: DC
  Administered 2017-03-07 – 2017-03-13 (×13): 25 mg via INTRAVENOUS
  Filled 2017-03-07 (×13): qty 0.5

## 2017-03-07 NOTE — Plan of Care (Signed)
Patient is in ICU, currently under PCCM care, highly appreciate CCM assistance. [facility] will assume care when patient is out of ICU.    Estill Cotta M.D. [facility] 03/07/2017, 7:27 AM  Pager: [phone]

## 2017-03-07 NOTE — Progress Notes (Signed)
eLink Physician-Brief Progress Note Patient Name: Lawrence Moore DOB: [birth year] MRN: [id number]   Date of Service  03/07/2017  HPI/Events of Note  Remains agitated and delirious.  eICU Interventions  Will try haldol prn 2mg  iv.     Intervention Category Minor Interventions: Agitation / anxiety - evaluation and management  Sharia Reeve 03/07/2017, 7:13 PM

## 2017-03-07 NOTE — Progress Notes (Signed)
[facility] Attending Note  Presenting HPI:  66 y.o. 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 Patient reporting pain and received multiple doses of morphine on 5/12. Critical care medicine consulted due to altered mentation around 8 PM. Patient given Narcan with improving alertness. Stat CT of head showed no evidence of acute stroke. Patient also noted to have acute hypercarbic respiratory failure. Ultimately transferred to the ICU for further treatment and close monitoring.  Subjective:  Patient placed on BiPAP transiently overnight but with nausea and vomiting this was discontinued.  Review of Systems:  Unable to obtain given altered mentation.   Temp:  [98 F (36.7 C)-98.6 F (37 C)] 98.3 F (36.8 C) (05/13 0739) Pulse Rate:  [88-142] 95 (05/13 0900) Resp:  [11-21] 15 (05/13 0800) BP: (111-156)/(58-103) 150/79 (05/13 0800) SpO2:  [89 %-100 %] 89 % (05/13 0900)   General:  No family at bedside. Awake. No acute distress. Integument:  Warm & dry. No rash on exposed skin. HEENT:  Dry mucus memebranes. No scleral icterus. Pupils symmetric. Neurological:  Cranial nerves grossly in tact. Moving all 4 extremities equally. Not following commands. Not oriented. Musculoskeletal:  No joint effusion or erythema appreciated. Symmetric muscle bulk. Pulmonary:  Good aeration bilaterally. Normal work of breathing on room air. Cardiovascular:  Regular rhythm. No appreciable JVD. Normal S1 & S2. Telemetry:  Sinus rhythm. Abdomen:  Soft. Protuberant. Normoactive bowel sounds. Grossly nontender.  LINES/TUBES: Foley 5/12 >>> PIV  CBC Latest Ref Rng & Units 03/07/2017 03/06/2017 03/06/2017  WBC 4.0 - 10.5 K/uL 14.1(H) 16.1(H) 12.4(H)  Hemoglobin 13.0 - 17.0 g/dL 11.9(L) 12.7(L) 13.6  Hematocrit 39.0 - 52.0 % 35.7(L) 37.3(L) 40.3  Platelets 150 - 400 K/uL 122(L) 134(L) 152    BMP Latest Ref Rng & Units 03/07/2017 03/06/2017 03/06/2017   Glucose 65 - 99 mg/dL 128(H) 108(H) 83  BUN 6 - 20 mg/dL 26(H) 23(H) 15  Creatinine 0.61 - 1.24 mg/dL 4.13(H) 3.52(H) 1.72(H)  Sodium 135 - 145 mmol/L 133(L) 131(L) 132(L)  Potassium 3.5 - 5.1 mmol/L 4.7 4.7 3.8  Chloride 101 - 111 mmol/L 99(L) 97(L) 96(L)  CO2 22 - 32 mmol/L 22 20(L) 27  Calcium 8.9 - 10.3 mg/dL 7.8(L) 7.9(L) 8.1(L)    Hepatic Function Latest Ref Rng & Units 03/07/2017 03/06/2017 02/24/2017  Total Protein 6.5 - 8.1 g/dL 5.4(L) 5.4(L) 6.0(L)  Albumin 3.5 - 5.0 g/dL 2.6(L) 2.6(L) 3.6  AST 15 - 41 U/L 82(H) 102(H) 28  ALT 17 - 63 U/L 108(H) 120(H) 47  Alk Phosphatase 38 - 126 U/L 112 106 80  Total Bilirubin 0.3 - 1.2 mg/dL 1.1 2.1(H) 1.0  Bilirubin, Direct 0.1 - 0.5 mg/dL - 1.2(H) -    IMAGING/STUDIES: MRI L-SPINE W/ & W/O 5/10: IMPRESSION: 1. Status post right L4 laminectomy with fluid collection in the operative bed causing mass effect on the dorsal aspect of the thecal sac with worsened narrowing of thecal sac compared to the preoperative study. 2. Unchanged distortion of the right lateral recess at L5-S1 secondary to the presence of granulation tissue. PORT CXR 5/11:  Personally reviewed by me. Patchy opacification within right mid and lower lung zone. Full left hilum. No pleural effusion appreciated. CT HEAD/ANGIO NECK/HEAD 5/12: IMPRESSION: 1. Negative for emergent large vessel occlusion. 2. Diminutive intracranial vessels and moderate distal basilar stenosis. 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 3. High-grade stenosis at the origin of the non dominant right vertebral artery. 4. No  carotid stenosis in the neck. COMPLETE ABD U/S 5/13:  Surgical absence of the gallbladder. No bile duct dilatation. Severe fatty infiltration of the liver. Minimal perinephric fluid noted along the lower pole of the left kidney, nonspecific.  MICROBIOLOGY: MRSA PCR 4/30:  Negative  Blood Cultures x2 5/10 >>> Urine Culture 5/10:  Negative  HIV 5/11:   Nonreactive  L-4 Laminectomy Bed Culture 5/11 >>> Blood Cultures x2 5/12 >>> Stool C diff PCR 5/12:  Negative   ANTIBIOTICS: Rocephin 5/11 >>> Vancomycin 5/11 >>>  SIGNIFICANT EVENTS: 05/02 - L4-5 microdiscectomy  05/10 - Admit 05/12 - Started on Solu-Cortef 137m IV q8hr (takes Prednisone 56mas outpatient) 05/13 - Solu-Cortef decreased to basal dosing  ASSESSMENT/PLAN:  [age].o. male admitted with epidural abscess post surgery/laminectomy. Now with altered mental status/acute encephalopathy that is likely multifactorial in etiology. Overall patient is more awake and alert today than yesterday per my review of documentation. Appreciate assistance in evaluation from neurology.  1. Acute hypercarbic respiratory failure: Resolving. Secondary to narcotics. Limiting further narcotic medications. No indication for endotracheal intubation at this time. 2. Acute encephalopathy: Likely multifactorial. Neurology consulted & appreciate assistance. Holding sedatives. Checking TSH, free T4, B12, and repeat ammonia. Holding on lactulose given altered mentation and potential for aspiration/ileus. Holding on MRI pending improvement in mental status given low likelihood of stroke. EEG pending. 3. Acute renal failure: Suspect urinary retention. Continuing normal saline maintenance IV fluid. Suspect prerenal state. Trending urine output with Foley catheter. Monitoring electrolytes every 12 hours. 4. Sepsis: Secondary to epidural abscess. Repeat cultures pending. Send a urinalysis and urine culture. Trending Procalcitonin per algorithm. 5. Epidural abscess: Continuing Vancomycin and Rocephin for empiric antibiotic coverage. 6. Transaminitis: Improving. Unclear etiology. Repeat hepatic function panel in the morning. 7. Hypopituitarism/adrenal insufficiency/diabetes insipidus: Holding DDAVP and dosing intermittently depending upon urine output. Continuing IV Synthroid. Switching Solu-Cortef to basal steroid  requirement.  8. Hyponatremia: Mild & improving. Trending electrolytes daily.  9. Thrombocytopenia: Mild. Trending cell counts daily with CBC. 10. Lactic Acidosis:  Resolved.   Prophylaxis:  SCDs & heparin [place] q8hr. Diet:  NPO pending improvement in mental status. Code Status:  Full Code per previous physician discussions. Disposition:  Remains critically ill in the ICU.  Family Update:  Sister updated via phone on 5/13 by Dr. NeAshok Cordia  I have personally spent a total of 33 minutes of critical care time today caring for the patient, updating his sister via phone, & reviewing the patient's electronic medical record.  JeSonia BallereAshok CordiaM.D. [facility] Pager:  [phone] 3pm or if no response, call [phone] 9:19 AM 03/07/17

## 2017-03-07 NOTE — Progress Notes (Signed)
[facility] Progress Note Patient Name: Lawrence Moore DOB: [birth year] MRN: [id number]   Date of Service  03/07/2017  HPI/Events of Note    eICU Interventions  Camera check - patient very restless and keeps taking off oxygen via [place] and sats hanging around 88-89 when not on oxygen. Advised to apply hand restrains so that he would not pull his canula. He is also trying to pull on foley in spite having mittens on. EEG ordered  But not done yet. I dont think there is an urgency of EEG as he is intermittently following commands, awake but confused and delirious. Not obtunded. Daughter at bedside.         Sharia Reeve 03/07/2017, 6:24 PM

## 2017-03-07 NOTE — Progress Notes (Signed)
Pharmacy Antibiotic Note Lawrence Moore is a 66 y.o. male  s/p L4-5 discectomy on 02/24/17 with lumbar abscesss/p aspiration of right L4 laminectomy.  Pt now in ARF, SCr 1 > 3.5 > 4.1, eCrCl < 20 ml/min, poor uop. A vancomycin random level drawn this morning was supratherapeutic at 41. Although this level is high, vancomycin was not at steady state, making the value difficult to interpret. Cultures from abscess remain neg.    Plan: -Hold vancomycin, dose based off of random levels vs discontinue  -Continue ceftriaxone 2g/24h -Monitor renal fx, uop, cultures   Height: 5\' 4"  (162.6 cm) Weight: 218 lb 4.8 oz (99 kg) IBW/kg (Calculated) : 59.2  Temp (24hrs), Avg:98.2 F (36.8 C), Min:97.7 F (36.5 C), Max:98.6 F (37 C)   Recent Labs Lab 03/11/2017 1530 03/17/2017 1656 03/09/2017 2041 03/05/17 0329 03/06/17 0243 03/06/17 2033 03/06/17 2258 03/07/17 0048 03/07/17 0505  WBC 13.5*  --   --  15.6* 12.4*  --  16.1*  --  14.1*  CREATININE 1.15  --   --  1.08 1.72* 3.52*  --   --  4.13*  LATICACIDVEN  --  0.87 1.01  --   --  2.2*  --  1.7  --   VANCORANDOM  --   --   --   --   --   --   --   --  41    Estimated Creatinine Clearance: 19.2 mL/min (A) (by C-G formula based on SCr of 4.13 mg/dL (H)).    Allergies  Allergen Reactions  . Adhesive [Tape] Other (See Comments)    BANDAIDS > REDNESS PAPER TAPE (CAUSES REDNESS & IRRITATION): (03/05/2016)  . Erythromycin Swelling    SWELLING REACTION UNSPECIFIED   . Ibuprofen Other (See Comments)    ABDOMINAL PAIN CRAMPING  . Zocor [Simvastatin] Other (See Comments)    ELEVATED LFT's  . Hydrocodone Other (See Comments)    UNSPECIFIED REACTION     Antimicrobials this admission: 5/10 vanc >  5/10 ceftriaxone >   Microbiology results: 5/10 blood cx: ngtd 5/10 urine cx: neg 5/12 cdiff: neg 5/11 L4 laminectomy abscess: reincubated     Hughes Better, PharmD, BCPS Clinical Pharmacist 03/07/2017 12:38 PM

## 2017-03-07 NOTE — Progress Notes (Signed)
Subjective: Interval History: Agitated.  Awaiting MRI and EEG.    Objective: Vital signs in last 24 hours: Temp:  [98 F (36.7 C)-98.6 F (37 C)] 98.3 F (36.8 C) (05/13 0739) Pulse Rate:  [88-142] 95 (05/13 0900) Resp:  [11-21] 15 (05/13 0800) BP: (111-156)/(58-103) 150/79 (05/13 0800) SpO2:  [89 %-100 %] 89 % (05/13 0900)  Intake/Output from previous day: 05/12 0701 - 05/13 0700 In: 4641.3 [P.O.:60; I.V.:4281.3; IV Piggyback:300] Out: 529 [Urine:525; Stool:4] Intake/Output this shift: Total I/O In: 125 [I.V.:125] Out: -  Nutritional status: Diet NPO time specified  Neurologic Exam:  Clouding of consciousness Agitated a bit Language is non-sensical mostly Follows some commands. Naming and repetition- impaired Moves all 4 extremities well and withdraws to pain   Lab Results:  Recent Labs  03/06/17 2033 03/06/17 2258 03/07/17 0505  WBC  --  16.1* 14.1*  HGB  --  12.7* 11.9*  HCT  --  37.3* 35.7*  PLT  --  134* 122*  NA 131*  --  133*  K 4.7  --  4.7  CL 97*  --  99*  CO2 20*  --  22  GLUCOSE 108*  --  128*  BUN 23*  --  26*  CREATININE 3.52*  --  4.13*  CALCIUM 7.9*  --  7.8*   Lipid Panel No results for input(s): CHOL, TRIG, HDL, CHOLHDL, VLDL, LDLCALC in the last 72 hours.  Studies/Results: Ct Angio Head W Or Wo Contrast  Result Date: 03/06/2017 CLINICAL DATA:  Sudden onset aphasia. EXAM: CT ANGIOGRAPHY HEAD AND NECK TECHNIQUE: Multidetector CT imaging of the head and neck was performed using the standard protocol during bolus administration of intravenous contrast. Multiplanar CT image reconstructions and MIPs were obtained to evaluate the vascular anatomy. Carotid stenosis measurements (when applicable) are obtained utilizing NASCET criteria, using the distal internal carotid diameter as the denominator. CONTRAST:  50 cc Isovue 370 intravenous COMPARISON:  Head CT from earlier today FINDINGS: CTA NECK FINDINGS Aortic arch: Atheromatous calcification  and wall thickening. Two vessel branching pattern Right carotid system: Mild atheromatous changes. No ulceration or dissection noted. Left carotid system: No noted atheromatous changes. No ulceration or dissection noted. Vertebral arteries: No proximal subclavian or brachiocephalic stenosis. Strongly dominant left vertebral artery that is smooth and widely patent to the dura. Diminutive right vertebral artery that is patent but appears highly stenotic at the origin. Skeleton: No acute or aggressive finding. Multilevel facet arthropathy. Other neck: Hypoplastic thyroid. Upper chest: Patchy apical lung density is likely atelectasis. Patient is on BiPAP. Review of the MIP images confirms the above findings CTA HEAD FINDINGS Anterior circulation: Atheromatous calcification of the bilateral carotid siphons. Intracranial vessels are diminutive, and there is streak artifact further limiting assessment. 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 No major branch occlusion noted. Atheromatous type irregularity of right more than left A1 segments. Negative for aneurysm. Posterior circulation: Strongly left dominant vertebral artery with calcified plaque but no significant stenosis. There is moderate narrowing of the distal basilar artery, with probable left eccentric plaque. PCA branches are diminutive and difficult to follow separate from opacified veins, likely moderately irregular from atherosclerosis. No major branch occlusion noted. Venous sinuses: Patent Anatomic variants: Negative Delayed phase: Not performed emergent setting Review of the MIP images confirms the above findings IMPRESSION: 1. Negative for emergent large vessel occlusion. 2. Diminutive intracranial vessels and moderate distal basilar stenosis. 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 3. High-grade stenosis at the  origin of the non dominant right vertebral artery. 4. No carotid stenosis in the neck.  Electronically Signed   By: Monte Fantasia M.D.   On: 03/06/2017 20:26   Ct Angio Neck W Or Wo Contrast  Result Date: 03/06/2017 CLINICAL DATA:  Sudden onset aphasia. EXAM: CT ANGIOGRAPHY HEAD AND NECK TECHNIQUE: Multidetector CT imaging of the head and neck was performed using the standard protocol during bolus administration of intravenous contrast. Multiplanar CT image reconstructions and MIPs were obtained to evaluate the vascular anatomy. Carotid stenosis measurements (when applicable) are obtained utilizing NASCET criteria, using the distal internal carotid diameter as the denominator. CONTRAST:  50 cc Isovue 370 intravenous COMPARISON:  Head CT from earlier today FINDINGS: CTA NECK FINDINGS Aortic arch: Atheromatous calcification and wall thickening. Two vessel branching pattern Right carotid system: Mild atheromatous changes. No ulceration or dissection noted. Left carotid system: No noted atheromatous changes. No ulceration or dissection noted. Vertebral arteries: No proximal subclavian or brachiocephalic stenosis. Strongly dominant left vertebral artery that is smooth and widely patent to the dura. Diminutive right vertebral artery that is patent but appears highly stenotic at the origin. Skeleton: No acute or aggressive finding. Multilevel facet arthropathy. Other neck: Hypoplastic thyroid. Upper chest: Patchy apical lung density is likely atelectasis. Patient is on BiPAP. Review of the MIP images confirms the above findings CTA HEAD FINDINGS Anterior circulation: Atheromatous calcification of the bilateral carotid siphons. Intracranial vessels are diminutive, and there is streak artifact further limiting assessment. 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 No major branch occlusion noted. Atheromatous type irregularity of right more than left A1 segments. Negative for aneurysm. Posterior circulation: Strongly left dominant vertebral artery with calcified plaque but  no significant stenosis. There is moderate narrowing of the distal basilar artery, with probable left eccentric plaque. PCA branches are diminutive and difficult to follow separate from opacified veins, likely moderately irregular from atherosclerosis. No major branch occlusion noted. Venous sinuses: Patent Anatomic variants: Negative Delayed phase: Not performed emergent setting Review of the MIP images confirms the above findings IMPRESSION: 1. Negative for emergent large vessel occlusion. 2. Diminutive intracranial vessels and moderate distal basilar stenosis. 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 3. High-grade stenosis at the origin of the non dominant right vertebral artery. 4. No carotid stenosis in the neck. Electronically Signed   By: Monte Fantasia M.D.   On: 03/06/2017 20:26   US Abdomen Complete  Result Date: 03/07/2017 CLINICAL DATA:  Unresponsive. Nausea and vomiting. Elevated liver function studies. Acute renal failure. Previous cholecystectomy. EXAM: ABDOMEN ULTRASOUND COMPLETE COMPARISON:  None. FINDINGS: Gallbladder: Surgical absence of the gallbladder. No fluid or other abnormality demonstrated in the gallbladder fossa. Common bile duct: Diameter: 4.1 mm, normal Liver: Diffusely increased liver parenchymal echotexture with limited visualization of internal structure and vessels consistent with severe fatty infiltration of the liver. No focal lesions identified but fatty infiltration can obscure visualization of focal liver lesions. IVC: Not visualized due to bowel gas. Pancreas: Not visualized due to bowel gas. Spleen: Size and appearance within normal limits. Right Kidney: Length: 11.2 cm. Echogenicity within normal limits. No mass or hydronephrosis visualized. Left Kidney: Length: 14 cm. Normal parenchymal echotexture. No focal mass or hydronephrosis. Minimal perinephric fluid noted along the lower pole. Abdominal aorta: Not visualized due to bowel gas. Other  findings: None. IMPRESSION: Surgical absence of the gallbladder. No bile duct dilatation. Severe fatty infiltration of the liver. Minimal perinephric fluid noted along the lower pole of the  left kidney, nonspecific. Electronically Signed   By: Lucienne Capers M.D.   On: 03/07/2017 01:53   Ct Aspiration  Result Date: 03/05/2017 INDICATION: 66 year old male with a history of recent right L4 laminectomy complicated by pain and fluid collection in the operative bed. He presents for aspiration. EXAM: CT-guided aspiration MEDICATIONS: None for this procedure ANESTHESIA/SEDATION: Fentanyl 50 mcg IV; Versed 2 mg IV Moderate Sedation Time:  14 minutes The patient was continuously monitored during the procedure by the interventional radiology nurse under my direct supervision. COMPLICATIONS: None immediate. PROCEDURE: Informed written consent was obtained from the patient after a thorough discussion of the procedural risks, benefits and alternatives. All questions were addressed. A timeout was performed prior to the initiation of the procedure. Planning axial CT was performed and the right L4 laminectomy site identified. The overlying skin was marked and then sterilely prepped and draped in standard fashion using chlorhexidine skin prep. Following local anesthesia with 1% lidocaine, an 18 gauge trocar needle was advanced and positions in the laminectomy surgical bed using intermittent CT guidance. Aspiration yields 5 mL turbid yellow fluid with small internal debris. The fluid was sent for culture. The needle was removed and a Band-Aid applied. IMPRESSION: Technically successful CT-guided aspiration of the right L4 laminectomy surgical bed yielding 5 mL turbid yellow fluid which was sent for culture. Signed, Criselda Peaches, MD [facility] Electronically Signed   By: Jacqulynn Cadet M.D.   On: 03/05/2017 17:16   Dg Chest Port 1 View  Result Date:  03/05/2017 CLINICAL DATA:  Acute onset of fever and respiratory failure. Recent lumbar laminectomy. Initial encounter. EXAM: PORTABLE CHEST 1 VIEW COMPARISON:  Chest radiograph performed 03/03/2017 FINDINGS: Worsening right basilar airspace opacity is compatible with pneumonia. No pleural effusion or pneumothorax is seen. The cardiomediastinal silhouette is normal in size. No acute osseous abnormalities are identified. IMPRESSION: Worsening right-sided pneumonia noted. Electronically Signed   By: Garald Balding M.D.   On: 03/05/2017 23:22   Ct Head Code Stroke Wo Contrast  Result Date: 03/06/2017 CLINICAL DATA:  Code stroke. Confusion and lethargy. Not following commands. EXAM: CT HEAD WITHOUT CONTRAST TECHNIQUE: Contiguous axial images were obtained from the base of the skull through the vertex without intravenous contrast. COMPARISON:  05/06/2016 FINDINGS: Brain: No evidence of acute infarction, hemorrhage, hydrocephalus, extra-axial collection or mass effect. Right frontal gliosis and surgical clips in the inferior right sylvian fissure in this patient with history of resected craniopharyngioma. Vascular: No hyperdense vessel noted. Atherosclerotic calcification. Skull: Right frontal craniotomy. Sinuses/Orbits: No acute finding Other: These results were review in person 03/06/2017 at 7:49 pm with Dr. Roland Rack . ASPECTS [facility] Stroke Program Early CT Score) Not scored this symptomatology. IMPRESSION: 1. No acute finding. 2. Sequela of craniopharyngioma resection. Electronically Signed   By: Monte Fantasia M.D.   On: 03/06/2017 19:55    Medications:  Scheduled: . desmopressin  0.1 mg Oral Daily  . desmopressin  0.15 mg Oral QHS  . DULoxetine  30 mg Oral Daily  . hydrocortisone sod succinate (SOLU-CORTEF) inj  100 mg Intravenous Q8H  . levothyroxine  75 mcg Intravenous Daily  . pantoprazole  40 mg Oral Daily  . pravastatin  40 mg Oral QHS    Assessment/Plan:  Suspect probable  metabolic encephalopathy due to multifactorial causes of possible sepsis, hyponatremia, hypocalcemia, renal failure, hyperammonia.  Awaiting EEG to assess for this.   Start Lactulose for high ammonia.    However, cannot fully rule  out stroke but less likely.  Awaiting MRI to assess for this.      LOS: 3 days   Rogue Jury, MD 03/07/2017  9:15 AM

## 2017-03-08 ENCOUNTER — Inpatient Hospital Stay (HOSPITAL_COMMUNITY): Payer: Medicare Other

## 2017-03-08 ENCOUNTER — Telehealth: Payer: Self-pay | Admitting: Internal Medicine

## 2017-03-08 DIAGNOSIS — J9600 Acute respiratory failure, unspecified whether with hypoxia or hypercapnia: Secondary | ICD-10-CM

## 2017-03-08 DIAGNOSIS — J9601 Acute respiratory failure with hypoxia: Secondary | ICD-10-CM

## 2017-03-08 DIAGNOSIS — J810 Acute pulmonary edema: Secondary | ICD-10-CM

## 2017-03-08 DIAGNOSIS — G9340 Encephalopathy, unspecified: Secondary | ICD-10-CM

## 2017-03-08 LAB — CBC WITH DIFFERENTIAL/PLATELET
BASOS PCT: 0 %
Band Neutrophils: 0 %
Basophils Absolute: 0 10*3/uL (ref 0.0–0.1)
Blasts: 0 %
EOS PCT: 0 %
Eosinophils Absolute: 0 10*3/uL (ref 0.0–0.7)
HCT: 33.7 % — ABNORMAL LOW (ref 39.0–52.0)
Hemoglobin: 11.7 g/dL — ABNORMAL LOW (ref 13.0–17.0)
Lymphocytes Relative: 2 %
Lymphs Abs: 0.3 10*3/uL — ABNORMAL LOW (ref 0.7–4.0)
MCH: 30.9 pg (ref 26.0–34.0)
MCHC: 34.7 g/dL (ref 30.0–36.0)
MCV: 88.9 fL (ref 78.0–100.0)
MONO ABS: 0.2 10*3/uL (ref 0.1–1.0)
MONOS PCT: 1 %
MYELOCYTES: 0 %
Metamyelocytes Relative: 0 %
NEUTROS PCT: 97 %
NRBC: 0 /100{WBCs}
Neutro Abs: 14.7 10*3/uL — ABNORMAL HIGH (ref 1.7–7.7)
PLATELETS: 133 10*3/uL — AB (ref 150–400)
Promyelocytes Absolute: 0 %
RBC: 3.79 MIL/uL — AB (ref 4.22–5.81)
RDW: 13.9 % (ref 11.5–15.5)
WBC MORPHOLOGY: INCREASED
WBC: 15.2 10*3/uL — AB (ref 4.0–10.5)

## 2017-03-08 LAB — COMPREHENSIVE METABOLIC PANEL
ALT: 82 U/L — ABNORMAL HIGH (ref 17–63)
ANION GAP: 13 (ref 5–15)
AST: 67 U/L — ABNORMAL HIGH (ref 15–41)
Albumin: 2.6 g/dL — ABNORMAL LOW (ref 3.5–5.0)
Alkaline Phosphatase: 119 U/L (ref 38–126)
BUN: 44 mg/dL — ABNORMAL HIGH (ref 6–20)
CO2: 18 mmol/L — AB (ref 22–32)
Calcium: 7.4 mg/dL — ABNORMAL LOW (ref 8.9–10.3)
Chloride: 105 mmol/L (ref 101–111)
Creatinine, Ser: 5.6 mg/dL — ABNORMAL HIGH (ref 0.61–1.24)
GFR calc non Af Amer: 10 mL/min — ABNORMAL LOW (ref >60)
GFR, EST AFRICAN AMERICAN: 11 mL/min — AB (ref >60)
GLUCOSE: 100 mg/dL — AB (ref 65–99)
POTASSIUM: 4.2 mmol/L (ref 3.5–5.1)
Sodium: 136 mmol/L (ref 135–145)
TOTAL PROTEIN: 5.5 g/dL — AB (ref 6.5–8.1)
Total Bilirubin: 0.8 mg/dL (ref 0.3–1.2)

## 2017-03-08 LAB — PROCALCITONIN: Procalcitonin: 33.36 ng/mL

## 2017-03-08 LAB — GLUCOSE, CAPILLARY
GLUCOSE-CAPILLARY: 80 mg/dL (ref 65–99)
Glucose-Capillary: 71 mg/dL (ref 65–99)
Glucose-Capillary: 90 mg/dL (ref 65–99)

## 2017-03-08 LAB — PHOSPHORUS: Phosphorus: 5.5 mg/dL — ABNORMAL HIGH (ref 2.5–4.6)

## 2017-03-08 LAB — SODIUM, URINE, RANDOM: SODIUM UR: 65 mmol/L

## 2017-03-08 LAB — MAGNESIUM: MAGNESIUM: 1.6 mg/dL — AB (ref 1.7–2.4)

## 2017-03-08 LAB — CREATININE, URINE, RANDOM: Creatinine, Urine: 100.72 mg/dL

## 2017-03-08 MED ORDER — ORAL CARE MOUTH RINSE
15.0000 mL | Freq: Two times a day (BID) | OROMUCOSAL | Status: DC
  Administered 2017-03-08 – 2017-03-09 (×3): 15 mL via OROMUCOSAL

## 2017-03-08 MED ORDER — HYDRALAZINE HCL 20 MG/ML IJ SOLN
10.0000 mg | INTRAMUSCULAR | Status: DC | PRN
  Administered 2017-03-08: 20 mg via INTRAVENOUS
  Filled 2017-03-08: qty 1

## 2017-03-08 MED ORDER — MORPHINE SULFATE (PF) 2 MG/ML IV SOLN: 2.0000 mg | INTRAVENOUS | Status: DC | PRN

## 2017-03-08 MED ORDER — CHLORHEXIDINE GLUCONATE 0.12 % MT SOLN
15.0000 mL | Freq: Two times a day (BID) | OROMUCOSAL | Status: DC
  Administered 2017-03-08 – 2017-03-09 (×3): 15 mL via OROMUCOSAL
  Filled 2017-03-08: qty 15

## 2017-03-08 MED ORDER — ALBUTEROL SULFATE (2.5 MG/3ML) 0.083% IN NEBU
2.5000 mg | INHALATION_SOLUTION | RESPIRATORY_TRACT | Status: DC | PRN
  Administered 2017-03-08 (×2): 2.5 mg via RESPIRATORY_TRACT
  Filled 2017-03-08 (×2): qty 3

## 2017-03-08 MED ORDER — MAGNESIUM SULFATE IN D5W 1-5 GM/100ML-% IV SOLN
1.0000 g | Freq: Once | INTRAVENOUS | Status: AC
  Administered 2017-03-08: 1 g via INTRAVENOUS
  Filled 2017-03-08: qty 100

## 2017-03-08 MED ORDER — FUROSEMIDE 10 MG/ML IJ SOLN
80.0000 mg | Freq: Once | INTRAMUSCULAR | Status: AC
  Administered 2017-03-09: 80 mg via INTRAVENOUS
  Filled 2017-03-08: qty 8

## 2017-03-08 MED ORDER — FUROSEMIDE 10 MG/ML IJ SOLN
80.0000 mg | Freq: Once | INTRAMUSCULAR | Status: AC
  Administered 2017-03-08: 80 mg via INTRAVENOUS
  Filled 2017-03-08: qty 8

## 2017-03-08 MED ORDER — FENTANYL CITRATE (PF) 100 MCG/2ML IJ SOLN
25.0000 ug | INTRAMUSCULAR | Status: DC | PRN
  Administered 2017-03-08 (×3): 50 ug via INTRAVENOUS
  Filled 2017-03-08 (×3): qty 2

## 2017-03-08 MED ORDER — LABETALOL HCL 5 MG/ML IV SOLN
10.0000 mg | INTRAVENOUS | Status: DC | PRN
Start: 2017-03-08 — End: 2017-03-15
  Administered 2017-03-08: 10 mg via INTRAVENOUS
  Filled 2017-03-08: qty 4

## 2017-03-08 NOTE — Telephone Encounter (Signed)
Please advise of message below as they wanted you notified. Thank you!

## 2017-03-08 NOTE — Consult Note (Signed)
[facility] Consult Note     Date: 03/08/2017                  Patient Name:  Lawrence Moore  MRN: [id number]  DOB: 1951/02/19  Age / Sex: 66 y.o., male         PCP: Golden Circle, FNP                 Service Requesting Consult: CCM                 Reason for Consult: Elevated serum creatinine            Chief Complaint: AMS  HPI: Lawrence Moore is a 66 y.o. 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   On 5/2 patient had a right lumbar microdiscectomy. On 5/10 he presented to the ED with back, leg numbness and fever. He was found to have an epidural abscess causing mass effect and was admitted for further management. On 5/12 he became less responsive with AMS. He was placed on BiPap and transferred to [facility] service. He was found to be hypercarbic likely secondary to narcotics that he was getting for back pain.  Since admission, patient's creatinine has increased significantly from admission (Cr baseline ~1 and is now 5.6). As such, nephrology has been consulted. His kidney function was noted to be normal with a creatinine of 1.08 on 5/11. Then on 5/12 creatinine went up to 1.72 and has risen daily since. Urine output has declined as well. When you look at everything, it was between 5/11 and 5/12 that he decompensated, had more of a fever and had a recorded blood pressure in 80s. In addition, he was noted to undergo a contrasted CT. He was on Celebrex as an outpatient and that was continued temporarily- then, also he was due to be on vancomycin and most recent level was in the 40s.  His initial urinalysis showed hematuria and 100 of protein but I tend to think that was more of Foley trauma rather than GN. He had granular casts noted on the UA as well. He underwent a renal ultrasound which showed fairly normal size kidneys with no hydronephrosis. He's received a lot of IV  fluids and is approximately 9 L positive with edema today.  Past Medical History:  Diagnosis Date  . Anxiety   . Asthma   . Complication of anesthesia    "woke from anesthia itching" lft knee replacment  . Degenerative joint disease   . Depression   . Diabetes insipidus ([place])   . GERD (gastroesophageal reflux disease)   . Headache    migraines  . Hyperlipidemia   . 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 Hypothyroidism   . 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 Shortness of breath dyspnea   . Tubular adenoma of colon 2004    Past Surgical History:  Procedure Laterality Date  . ABDOMINAL AORTAGRAM N/A 08/31/2012   Procedure: ABDOMINAL AORTAGRAM;  Surgeon: Rosetta Posner, MD;  Location: [facility] CATH LAB;  Service: Cardiovascular;  Laterality: N/A;  . APPENDECTOMY    . BACK SURGERY    . CHOLECYSTECTOMY  2002   Lap. cholecystectomy  . CRANIOTOMY  1964, 1966   Infratentorial exc. of craniopharyngioma  . JOINT REPLACEMENT  2009   Left Knee replacement  . KNEE ARTHROSCOPY  2006   Right Knee  .  KNEE ARTHROSCOPY Left 12  . LUMBAR LAMINECTOMY/DECOMPRESSION MICRODISCECTOMY N/A 10/12/2014   Procedure: Lumbar five-Sacral one microdiskectomy;  Surgeon: Ashok Pall, MD;  Location: [place];  Service: Neurosurgery;  Laterality: N/A;  Lumbar five-Sacral one microdiskectomy  . LUMBAR LAMINECTOMY/DECOMPRESSION MICRODISCECTOMY Right 01/10/2016   Procedure: LUMBAR LAMINECTOMY/DECOMPRESSION MICRODISCECTOMY 1 LEVEL;  Surgeon: Ashok Pall, MD;  Location: [place] NEURO ORS;  Service: Neurosurgery;  Laterality: Right;  Right L5S1 microdiskectomy  . LUMBAR LAMINECTOMY/DECOMPRESSION MICRODISCECTOMY Right 02/24/2017   Procedure: RIGHT LUMBAR FOUR-FIVE MICRODISCECTOMY;  Surgeon: Ashok Pall, MD;  Location: [place];  Service: Neurosurgery;  Laterality: Right;  MICRODISCECTOMY LUMBAR 4- LUMBAR 5 RIGHT  . SPINE SURGERY  1998    Family History  Problem Relation Age of Onset  . Depression Mother   .  Hyperlipidemia Mother   . Other Mother        VARICOSE VEINS  . Deep vein thrombosis Father   . Prostate cancer Father   . Depression Sister   . Hyperlipidemia Sister   . Alzheimer's disease Maternal Grandmother   . Healthy Maternal Grandfather   . Healthy Paternal Grandmother   . Stroke Paternal Grandfather    Social History:  reports that he quit smoking about 12 years ago. His smoking use included Cigarettes. He has a 99.00 pack-year smoking history. He has never used smokeless tobacco. He reports that he does not drink alcohol or use drugs.  Allergies:  Allergies  Allergen Reactions  . Adhesive [Tape] Other (See Comments)    BANDAIDS > REDNESS PAPER TAPE (CAUSES REDNESS & IRRITATION): (03/05/2016)  . Erythromycin Swelling    SWELLING REACTION UNSPECIFIED   . Ibuprofen Other (See Comments)    ABDOMINAL PAIN CRAMPING  . Zocor [Simvastatin] Other (See Comments)    ELEVATED LFT's  . Hydrocodone Other (See Comments)    UNSPECIFIED REACTION     Medications Prior to Admission  Medication Sig Dispense Refill  . celecoxib (CELEBREX) 200 MG capsule Take 200 mg by mouth 2 (two) times daily.    . cholecalciferol (VITAMIN D) 1000 UNITS tablet Take 1,000 Units by mouth daily.    Marland Kitchen desmopressin (DDAVP) 0.1 MG tablet Take 1 tablet by mouth  every morning and 1 and 1/2 tablets by mouth at night 225 tablet 3  . FLUoxetine (PROZAC) 20 MG capsule Take 40 mg by mouth every morning.    . gabapentin (NEURONTIN) 300 MG capsule TAKE 2 CAPSULES BY MOUTH AT BEDTIME 180 capsule 1  . Ipratropium-Albuterol (COMBIVENT RESPIMAT) 20-100 MCG/ACT AERS respimat Inhale 1 puff into the lungs every 6 (six) hours as needed for wheezing or shortness of breath. 4 g 2  . levothyroxine (SYNTHROID, LEVOTHROID) 150 MCG tablet TAKE 1 TABLET BY MOUTH  DAILY BEFORE BREAKFAST. 90 tablet 3  . oxyCODONE (OXY IR/ROXICODONE) 5 MG immediate release tablet Take 5 mg by mouth every 6 (six) hours as needed for severe pain.    .  pantoprazole (PROTONIX) 40 MG tablet Take 1 tablet (40 mg total) by mouth daily. 90 tablet 1  . pravastatin (PRAVACHOL) 40 MG tablet TAKE 1 TABLET BY MOUTH AT  BEDTIME 90 tablet 1  . predniSONE (DELTASONE) 5 MG tablet Take 1 tablet (5 mg total) by mouth daily. 100 tablet 3  . QUEtiapine (SEROQUEL) 50 MG tablet Take 50 mg by mouth at bedtime.     . SUMAtriptan (IMITREX) 100 MG tablet Take 1 tablet by mouth at the onset of a headache and may repeat 1 hour later. 10 tablet 2  .  DULoxetine (CYMBALTA) 30 MG capsule Take 1 capsule daily for 1 week and than twice daily (Patient not taking: Reported on 03/17/2017) 60 capsule 0   BMP Latest Ref Rng & Units 03/08/2017 03/07/2017 03/07/2017  Glucose 65 - 99 mg/dL 100(H) 108(H) 128(H)  BUN 6 - 20 mg/dL 44(H) 36(H) 26(H)  Creatinine 0.61 - 1.24 mg/dL 5.60(H) 4.95(H) 4.13(H)  Sodium 135 - 145 mmol/L 136 133(L) 133(L)  Potassium 3.5 - 5.1 mmol/L 4.2 5.0 4.7  Chloride 101 - 111 mmol/L 105 103 99(L)  CO2 22 - 32 mmol/L 18(L) 19(L) 22  Calcium 8.9 - 10.3 mg/dL 7.4(L) 7.4(L) 7.8(L)   CBC Latest Ref Rng & Units 03/08/2017 03/07/2017 03/06/2017  WBC 4.0 - 10.5 K/uL 15.2(H) 14.1(H) 16.1(H)  Hemoglobin 13.0 - 17.0 g/dL 11.7(L) 11.9(L) 12.7(L)  Hematocrit 39.0 - 52.0 % 33.7(L) 35.7(L) 37.3(L)  Platelets 150 - 400 K/uL 133(L) 122(L) 134(L)     Ct Angio Head W Or Wo Contrast  Result Date: 03/06/2017 CLINICAL DATA:  Sudden onset aphasia. EXAM: CT ANGIOGRAPHY HEAD AND NECK TECHNIQUE: Multidetector CT imaging of the head and neck was performed using the standard protocol during bolus administration of intravenous contrast. Multiplanar CT image reconstructions and MIPs were obtained to evaluate the vascular anatomy. Carotid stenosis measurements (when applicable) are obtained utilizing NASCET criteria, using the distal internal carotid diameter as the denominator. CONTRAST:  50 cc Isovue 370 intravenous COMPARISON:  Head CT from earlier today FINDINGS: CTA NECK FINDINGS  Aortic arch: Atheromatous calcification and wall thickening. Two vessel branching pattern Right carotid system: Mild atheromatous changes. No ulceration or dissection noted. Left carotid system: No noted atheromatous changes. No ulceration or dissection noted. Vertebral arteries: No proximal subclavian or brachiocephalic stenosis. Strongly dominant left vertebral artery that is smooth and widely patent to the dura. Diminutive right vertebral artery that is patent but appears highly stenotic at the origin. Skeleton: No acute or aggressive finding. Multilevel facet arthropathy. Other neck: Hypoplastic thyroid. Upper chest: Patchy apical lung density is likely atelectasis. Patient is on BiPAP. Review of the MIP images confirms the above findings CTA HEAD FINDINGS Anterior circulation: Atheromatous calcification of the bilateral carotid siphons. Intracranial vessels are diminutive, and there is streak artifact further limiting assessment. 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 No major branch occlusion noted. Atheromatous type irregularity of right more than left A1 segments. Negative for aneurysm. Posterior circulation: Strongly left dominant vertebral artery with calcified plaque but no significant stenosis. There is moderate narrowing of the distal basilar artery, with probable left eccentric plaque. PCA branches are diminutive and difficult to follow separate from opacified veins, likely moderately irregular from atherosclerosis. No major branch occlusion noted. Venous sinuses: Patent Anatomic variants: Negative Delayed phase: Not performed emergent setting Review of the MIP images confirms the above findings IMPRESSION: 1. Negative for emergent large vessel occlusion. 2. Diminutive intracranial vessels and moderate distal basilar stenosis. 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 3. High-grade stenosis at the origin of the non dominant right vertebral  artery. 4. No carotid stenosis in the neck. Electronically Signed   By: Monte Fantasia M.D.   On: 03/06/2017 20:26   Ct Angio Neck W Or Wo Contrast  Result Date: 03/06/2017 CLINICAL DATA:  Sudden onset aphasia. EXAM: CT ANGIOGRAPHY HEAD AND NECK TECHNIQUE: Multidetector CT imaging of the head and neck was performed using the standard protocol during bolus administration of intravenous contrast. Multiplanar CT image reconstructions and MIPs were obtained to evaluate the vascular  anatomy. Carotid stenosis measurements (when applicable) are obtained utilizing NASCET criteria, using the distal internal carotid diameter as the denominator. CONTRAST:  50 cc Isovue 370 intravenous COMPARISON:  Head CT from earlier today FINDINGS: CTA NECK FINDINGS Aortic arch: Atheromatous calcification and wall thickening. Two vessel branching pattern Right carotid system: Mild atheromatous changes. No ulceration or dissection noted. Left carotid system: No noted atheromatous changes. No ulceration or dissection noted. Vertebral arteries: No proximal subclavian or brachiocephalic stenosis. Strongly dominant left vertebral artery that is smooth and widely patent to the dura. Diminutive right vertebral artery that is patent but appears highly stenotic at the origin. Skeleton: No acute or aggressive finding. Multilevel facet arthropathy. Other neck: Hypoplastic thyroid. Upper chest: Patchy apical lung density is likely atelectasis. Patient is on BiPAP. Review of the MIP images confirms the above findings CTA HEAD FINDINGS Anterior circulation: Atheromatous calcification of the bilateral carotid siphons. Intracranial vessels are diminutive, and there is streak artifact further limiting assessment. 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 No major branch occlusion noted. Atheromatous type irregularity of right more than left A1 segments. Negative for aneurysm. Posterior circulation: Strongly left  dominant vertebral artery with calcified plaque but no significant stenosis. There is moderate narrowing of the distal basilar artery, with probable left eccentric plaque. PCA branches are diminutive and difficult to follow separate from opacified veins, likely moderately irregular from atherosclerosis. No major branch occlusion noted. Venous sinuses: Patent Anatomic variants: Negative Delayed phase: Not performed emergent setting Review of the MIP images confirms the above findings IMPRESSION: 1. Negative for emergent large vessel occlusion. 2. Diminutive intracranial vessels and moderate distal basilar stenosis. 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 3. High-grade stenosis at the origin of the non dominant right vertebral artery. 4. No carotid stenosis in the neck. Electronically Signed   By: Monte Fantasia M.D.   On: 03/06/2017 20:26   US Abdomen Complete  Result Date: 03/07/2017 CLINICAL DATA:  Unresponsive. Nausea and vomiting. Elevated liver function studies. Acute renal failure. Previous cholecystectomy. EXAM: ABDOMEN ULTRASOUND COMPLETE COMPARISON:  None. FINDINGS: Gallbladder: Surgical absence of the gallbladder. No fluid or other abnormality demonstrated in the gallbladder fossa. Common bile duct: Diameter: 4.1 mm, normal Liver: Diffusely increased liver parenchymal echotexture with limited visualization of internal structure and vessels consistent with severe fatty infiltration of the liver. No focal lesions identified but fatty infiltration can obscure visualization of focal liver lesions. IVC: Not visualized due to bowel gas. Pancreas: Not visualized due to bowel gas. Spleen: Size and appearance within normal limits. Right Kidney: Length: 11.2 cm. Echogenicity within normal limits. No mass or hydronephrosis visualized. Left Kidney: Length: 14 cm. Normal parenchymal echotexture. No focal mass or hydronephrosis. Minimal perinephric fluid noted along the lower pole. Abdominal  aorta: Not visualized due to bowel gas. Other findings: None. IMPRESSION: Surgical absence of the gallbladder. No bile duct dilatation. Severe fatty infiltration of the liver. Minimal perinephric fluid noted along the lower pole of the left kidney, nonspecific. Electronically Signed   By: Lucienne Capers M.D.   On: 03/07/2017 01:53   Ct Head Code Stroke Wo Contrast  Result Date: 03/06/2017 CLINICAL DATA:  Code stroke. Confusion and lethargy. Not following commands. EXAM: CT HEAD WITHOUT CONTRAST TECHNIQUE: Contiguous axial images were obtained from the base of the skull through the vertex without intravenous contrast. COMPARISON:  05/06/2016 FINDINGS: Brain: No evidence of acute infarction, hemorrhage, hydrocephalus, extra-axial collection or mass effect. Right frontal gliosis and surgical clips in the  inferior right sylvian fissure in this patient with history of resected craniopharyngioma. Vascular: No hyperdense vessel noted. Atherosclerotic calcification. Skull: Right frontal craniotomy. Sinuses/Orbits: No acute finding Other: These results were review in person 03/06/2017 at 7:49 pm with Dr. Roland Rack . ASPECTS [facility] Stroke Program Early CT Score) Not scored this symptomatology. IMPRESSION: 1. No acute finding. 2. Sequela of craniopharyngioma resection. Electronically Signed   By: Monte Fantasia M.D.   On: 03/06/2017 19:55    ROS: Unable to obtain as patient is altered  Blood pressure (!) 159/93, pulse 85, temperature 98.4 F (36.9 C), temperature source Oral, resp. rate 18, height 5\' 4"  (1.626 m), weight 218 lb 4.8 oz (99 kg), SpO2 (!) 88 %. Physical Exam - Gen- small in stature white male. He is a arousable with stimuli will say a few words and then go off to sleep. HEENT: Is blind in right eye with marginal vision in the left eye. Mucous members are moist: Neck: Does appear to be JVD cardiovascular: Regular rate and rhythm without murmur, gallop, or rub. Lungs: Coarse breath sounds  bilaterally abdomen: Positive bowel sounds, slightly distended nontender. Extremities reveal 2+ pitting edema to dependent areas   Assessment/Plan  66 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 He's also developed acute kidney injury in the setting of hypotension/contrasted study/NSAIDs/supratherapeuticvanc level  Acute Kidney Injury: Baseline Cr 1.1. Creatinine initially 1.72 on 5/12 and now 5.6. Electrolytes stable with the exception of slightly low Mag of 1.6 and low Ca of 7.4. Bicarb low at 18. UA on 5/13 showing granular casts, mod hgb, large leuks, no ketones. Possible prerenal etiology (hypotension in the setting of sepsis and opioid overdose however he has been hypertensive in the last couple days), other contributing factors could be a contrasted study, super therapeutic vanc level as well as NSAIDs. This could be slow to recover, however, there are no acute indications for dialysis, and the fact that he had normal renal function just 4 days ago hopefully means that kidney function will recover. The question is whether he would need dialysis before that would happen.  - Going to stop IV fluids as I believe tank his full - Avoid nephrotoxic agents if possible- vancomycin has been discontinued but the medication will stick around secondary to his renal function - Strict I/Os - Daily renal function panel   Non anion gap Metabolic Acidosis: Bicarb 18, has been down trending - May need bicarbonate repletion at some point   Sepsis 2/2 to epidural abscess after right lumbar microdiscectomy: Previously on Vanc which was stopped. Procalcitonin high at 33.36.  - Continue Rocephin  - ID consulted  Panhypopituitarism:  - Continue Solu cortef and synthroid  Metabolic encephalopthy: Unclear cause, he does have increased BUN to 44- but not likely to be the sole cause. Previosly elevated ammonia and he was started on  Lactulose. Ammonia normal now to 26.  - EEG - Holding prozac and cymbalta - PRN Haldol   HTN: Hypertensive up to 845'X systolic and low 646'O diastolic, has been since 0/32. Previous to that he had episodes of hypotension - PRN Labetolol   it could be because he is volume overloaded that his blood pressure is higher at this time. We will start by discontinuing IV fluids but not give Lasix yet-   History of DI- DDAVP is on hold. His DI is controlled because he is oliguric at this time- will continue to watch sodium which is reasonable,  in fact low at this time    Smitty Cords, MD [place], PGY-2  03/08/2017, 11:19 AM   Patient seen and examined, agree with above note with above modifications. 66 year old white male with relatively normal kidney function at baseline. He is status post discectomy now complicated by an epidural abscess with transient hypotension, status post contrasted study, super therapeutic vanc level as well as NSAIDs. I believe all of this has contributed to his acute kidney injury. There are no indications for dialysis at this time. I believe this tank is also on going to stop IV fluids. We will continue with supportive care. Because kidney function was good at baseline I'm hopeful for recovery, the only question will be whether dialysis will be required before that happens.  Thank for this consult we will continue to follow with you  Corliss Parish, MD 03/08/2017

## 2017-03-08 NOTE — Telephone Encounter (Signed)
So sorry to hear that! I hope he gets out soon!!!

## 2017-03-08 NOTE — Progress Notes (Signed)
eLink Physician-Brief Progress Note Patient Name: Lawrence Moore DOB: 24-Oct-1951 MRN: [id number]   Date of Service  03/08/2017  HPI/Events of Note  New R > L alveolar infiltrates on CXR. Suspect pulm edema in setting acute renal failure. Will give lasix x 1, although he has been oliguric, likely will not respond. May need resp support given his WOB, evolving confusion.   eICU Interventions       Intervention Category Major Interventions: Respiratory failure - evaluation and management  [name] S. 03/08/2017, 10:22 PM

## 2017-03-08 NOTE — Progress Notes (Signed)
PCCM Progress Note  Admission date: 03/05/2017 Consult date: 03/06/2017 Referring provider: Dr. Tana Coast, [facility]  CC: Altered mental status  HPI: 66 yo male with altered mental status after receiving pain medications.  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  He was found to have worsening mental status and hypercapnia.  Subjective: Confused.  Vital signs: BP (!) 188/84   Pulse 88   Temp 97.6 F (36.4 C) (Oral)   Resp 20   Ht 5\' 4"  (1.626 m)   Wt 218 lb 4.8 oz (99 kg)   SpO2 93%   BMI 37.47 kg/m   Intake/output: I/O last 3 completed shifts: In: 3600 [I.V.:3500; IV Piggyback:100] Out: 635 [Urine:635]  General: confused Neuro: mumbles few words, moving extremities, agitated HEENT: no stridor Cardiac: regular, no murmur Chest: no wheeze Abd: soft, non tender Ext: no edema Skin: no rashes   CMP Latest Ref Rng & Units 03/08/2017 03/07/2017 03/07/2017  Glucose 65 - 99 mg/dL 100(H) 108(H) 128(H)  BUN 6 - 20 mg/dL 44(H) 36(H) 26(H)  Creatinine 0.61 - 1.24 mg/dL 5.60(H) 4.95(H) 4.13(H)  Sodium 135 - 145 mmol/L 136 133(L) 133(L)  Potassium 3.5 - 5.1 mmol/L 4.2 5.0 4.7  Chloride 101 - 111 mmol/L 105 103 99(L)  CO2 22 - 32 mmol/L 18(L) 19(L) 22  Calcium 8.9 - 10.3 mg/dL 7.4(L) 7.4(L) 7.8(L)  Total Protein 6.5 - 8.1 g/dL 5.5(L) - 5.4(L)  Total Bilirubin 0.3 - 1.2 mg/dL 0.8 - 1.1  Alkaline Phos 38 - 126 U/L 119 - 112  AST 15 - 41 U/L 67(H) - 82(H)  ALT 17 - 63 U/L 82(H) - 108(H)     CBC Latest Ref Rng & Units 03/08/2017 03/07/2017 03/06/2017  WBC 4.0 - 10.5 K/uL 15.2(H) 14.1(H) 16.1(H)  Hemoglobin 13.0 - 17.0 g/dL 11.7(L) 11.9(L) 12.7(L)  Hematocrit 39.0 - 52.0 % 33.7(L) 35.7(L) 37.3(L)  Platelets 150 - 400 K/uL 133(L) 122(L) 134(L)     ABG    Component Value Date/Time   PHART 7.197 (LL) 03/07/2017 0135   PCO2ART 57.7 (H) 03/07/2017 0135   PO2ART 87.1 03/07/2017 0135   HCO3 21.6 03/07/2017 0135   TCO2 27 08/31/2012 1144    ACIDBASEDEF 5.3 (H) 03/07/2017 0135   O2SAT 95.7 03/07/2017 0135     CBG (last 3)   Recent Labs  03/05/17 1637 03/05/17 1735 03/07/17 0317  GLUCAP 57* 78 117*     Imaging: Ct Angio Head W Or Wo Contrast  Result Date: 03/06/2017 CLINICAL DATA:  Sudden onset aphasia. EXAM: CT ANGIOGRAPHY HEAD AND NECK TECHNIQUE: Multidetector CT imaging of the head and neck was performed using the standard protocol during bolus administration of intravenous contrast. Multiplanar CT image reconstructions and MIPs were obtained to evaluate the vascular anatomy. Carotid stenosis measurements (when applicable) are obtained utilizing NASCET criteria, using the distal internal carotid diameter as the denominator. CONTRAST:  50 cc Isovue 370 intravenous COMPARISON:  Head CT from earlier today FINDINGS: CTA NECK FINDINGS Aortic arch: Atheromatous calcification and wall thickening. Two vessel branching pattern Right carotid system: Mild atheromatous changes. No ulceration or dissection noted. Left carotid system: No noted atheromatous changes. No ulceration or dissection noted. Vertebral arteries: No proximal subclavian or brachiocephalic stenosis. Strongly dominant left vertebral artery that is smooth and widely patent to the dura. Diminutive right vertebral artery that is patent but appears highly stenotic at the origin. Skeleton: No acute or aggressive finding. Multilevel facet arthropathy. Other neck: Hypoplastic thyroid. Upper chest: Patchy  apical lung density is likely atelectasis. Patient is on BiPAP. Review of the MIP images confirms the above findings CTA HEAD FINDINGS Anterior circulation: Atheromatous calcification of the bilateral carotid siphons. Intracranial vessels are diminutive, and there is streak artifact further limiting assessment. 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 No major branch occlusion noted. Atheromatous type irregularity of right more than left A1  segments. Negative for aneurysm. Posterior circulation: Strongly left dominant vertebral artery with calcified plaque but no significant stenosis. There is moderate narrowing of the distal basilar artery, with probable left eccentric plaque. PCA branches are diminutive and difficult to follow separate from opacified veins, likely moderately irregular from atherosclerosis. No major branch occlusion noted. Venous sinuses: Patent Anatomic variants: Negative Delayed phase: Not performed emergent setting Review of the MIP images confirms the above findings IMPRESSION: 1. Negative for emergent large vessel occlusion. 2. Diminutive intracranial vessels and moderate distal basilar stenosis. 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 3. High-grade stenosis at the origin of the non dominant right vertebral artery. 4. No carotid stenosis in the neck. Electronically Signed   By: Monte Fantasia M.D.   On: 03/06/2017 20:26   Ct Angio Neck W Or Wo Contrast  Result Date: 03/06/2017 CLINICAL DATA:  Sudden onset aphasia. EXAM: CT ANGIOGRAPHY HEAD AND NECK TECHNIQUE: Multidetector CT imaging of the head and neck was performed using the standard protocol during bolus administration of intravenous contrast. Multiplanar CT image reconstructions and MIPs were obtained to evaluate the vascular anatomy. Carotid stenosis measurements (when applicable) are obtained utilizing NASCET criteria, using the distal internal carotid diameter as the denominator. CONTRAST:  50 cc Isovue 370 intravenous COMPARISON:  Head CT from earlier today FINDINGS: CTA NECK FINDINGS Aortic arch: Atheromatous calcification and wall thickening. Two vessel branching pattern Right carotid system: Mild atheromatous changes. No ulceration or dissection noted. Left carotid system: No noted atheromatous changes. No ulceration or dissection noted. Vertebral arteries: No proximal subclavian or brachiocephalic stenosis. Strongly dominant left vertebral  artery that is smooth and widely patent to the dura. Diminutive right vertebral artery that is patent but appears highly stenotic at the origin. Skeleton: No acute or aggressive finding. Multilevel facet arthropathy. Other neck: Hypoplastic thyroid. Upper chest: Patchy apical lung density is likely atelectasis. Patient is on BiPAP. Review of the MIP images confirms the above findings CTA HEAD FINDINGS Anterior circulation: Atheromatous calcification of the bilateral carotid siphons. Intracranial vessels are diminutive, and there is streak artifact further limiting assessment. 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 No major branch occlusion noted. Atheromatous type irregularity of right more than left A1 segments. Negative for aneurysm. Posterior circulation: Strongly left dominant vertebral artery with calcified plaque but no significant stenosis. There is moderate narrowing of the distal basilar artery, with probable left eccentric plaque. PCA branches are diminutive and difficult to follow separate from opacified veins, likely moderately irregular from atherosclerosis. No major branch occlusion noted. Venous sinuses: Patent Anatomic variants: Negative Delayed phase: Not performed emergent setting Review of the MIP images confirms the above findings IMPRESSION: 1. Negative for emergent large vessel occlusion. 2. Diminutive intracranial vessels and moderate distal basilar stenosis. 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 3. High-grade stenosis at the origin of the non dominant right vertebral artery. 4. No carotid stenosis in the neck. Electronically Signed   By: Monte Fantasia M.D.   On: 03/06/2017 20:26   US Abdomen Complete  Result Date: 03/07/2017 CLINICAL DATA:  Unresponsive. Nausea  and vomiting. Elevated liver function studies. Acute renal failure. Previous cholecystectomy. EXAM: ABDOMEN ULTRASOUND COMPLETE COMPARISON:  None. FINDINGS:  Gallbladder: Surgical absence of the gallbladder. No fluid or other abnormality demonstrated in the gallbladder fossa. Common bile duct: Diameter: 4.1 mm, normal Liver: Diffusely increased liver parenchymal echotexture with limited visualization of internal structure and vessels consistent with severe fatty infiltration of the liver. No focal lesions identified but fatty infiltration can obscure visualization of focal liver lesions. IVC: Not visualized due to bowel gas. Pancreas: Not visualized due to bowel gas. Spleen: Size and appearance within normal limits. Right Kidney: Length: 11.2 cm. Echogenicity within normal limits. No mass or hydronephrosis visualized. Left Kidney: Length: 14 cm. Normal parenchymal echotexture. No focal mass or hydronephrosis. Minimal perinephric fluid noted along the lower pole. Abdominal aorta: Not visualized due to bowel gas. Other findings: None. IMPRESSION: Surgical absence of the gallbladder. No bile duct dilatation. Severe fatty infiltration of the liver. Minimal perinephric fluid noted along the lower pole of the left kidney, nonspecific. Electronically Signed   By: Lucienne Capers M.D.   On: 03/07/2017 01:53   Ct Head Code Stroke Wo Contrast  Result Date: 03/06/2017 CLINICAL DATA:  Code stroke. Confusion and lethargy. Not following commands. EXAM: CT HEAD WITHOUT CONTRAST TECHNIQUE: Contiguous axial images were obtained from the base of the skull through the vertex without intravenous contrast. COMPARISON:  05/06/2016 FINDINGS: Brain: No evidence of acute infarction, hemorrhage, hydrocephalus, extra-axial collection or mass effect. Right frontal gliosis and surgical clips in the inferior right sylvian fissure in this patient with history of resected craniopharyngioma. Vascular: No hyperdense vessel noted. Atherosclerotic calcification. Skull: Right frontal craniotomy. Sinuses/Orbits: No acute finding Other: These results were review in person 03/06/2017 at 7:49 pm with Dr.  Roland Rack . ASPECTS [facility] Stroke Program Early CT Score) Not scored this symptomatology. IMPRESSION: 1. No acute finding. 2. Sequela of craniopharyngioma resection. Electronically Signed   By: Monte Fantasia M.D.   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 - Abx per ID  Acute metabolic encephalopathy >> hyponatremia, sepsis, renal failure, elevated ammonia. Hx of depression - monitor mental status - f/u EEG - hold outpt prozac, cymbalta - prn fentanyl, haldol  Acute hypoxic/hypercapnic respiratory failure. Hx of COPD. - oxygen to keep SpO2 > 90% - Prn BDs  Hypopituitarism. - continue solu cortef, synthroid  AKI >> baseline creatinine 1.1 from 02/22/17. Metabolic acidosis.  -monitor renal fx, urine outpt - check FeNa - will ask renal to assess  Elevated blood pressure. - prn labetalol for SBP > 170, DBP > 105  DVT prophylaxis - SQ heparin SUP - not indicated Nutrition - NPO Goals of care - full code  Updated pt's sister at bedside  CC time 63 minutes  Chesley Mires, MD [place] 03/08/2017, 9:46 AM Pager:  [phone] After 3pm call: [phone]

## 2017-03-08 NOTE — Progress Notes (Signed)
[facility] Progress Note Patient Name: Lawrence Moore DOB: 06-09-1951 MRN: [id number]   Date of Service  03/08/2017  HPI/Events of Note  Mg low  eICU Interventions  replete     Intervention Category Intermediate Interventions: Abdominal pain - evaluation and management;Electrolyte abnormality - evaluation and management  [place] 03/08/2017, 5:20 AM

## 2017-03-08 NOTE — Progress Notes (Signed)
EEG completed, results pending. 

## 2017-03-08 NOTE — Progress Notes (Signed)
[facility] Progress Note Patient Name: Lawrence Moore DOB: 05-15-1951 MRN: [id number]   Date of Service  03/08/2017  HPI/Events of Note  Fentanyl has been ineffective for pain control. Not a candidate for NSAID w his renal fxn. Will try changing to morphine see if this is more effective  eICU Interventions       Intervention Category Intermediate Interventions: Pain - evaluation and management  Dayshawn Irizarry S. 03/08/2017, 6:53 PM

## 2017-03-08 NOTE — Telephone Encounter (Signed)
Patient is in icu been in there for two days, he has an infection in his spinal area, patient levels are all out of wack. Don't know when he will get out. Patient daughter just wanted you to know.

## 2017-03-08 NOTE — Progress Notes (Signed)
Neurology Progress Note  I am assuming neurologic care of this patient today. I have reviewed his chart at length. I have also discussed his case with his sister who is present at the bedside. The patient is unable to provide any information because he is encephalopathic.   He initially presented on 03/02/2017 with back pain, fever, and leg numbness following R L4-5 microdiscectomy on 5/2. Initially he had improved back pain and motor function after his surgery. The night before his presentation he developed increased back pain followed by BLE numbness (R>L) and weakness. He also had subjective fever and vomiting. When his LE symptoms continued to worsen so he came to the ED for evaluation where he was noted to have a fever of 101.26F. MRI of the lumbar spine was obtained and showed a fluid collection with impingement upon the thecal sac. He was placed on empiric antibiotics with ceftriaxone and vancomycin. On 5/11, was sent to interventional radiology for aspiration of this fluid collection. The radiology note describes collection of turbid yellow fluid with cultures pending but showing rare coagulase-negative Staphylococcus species. Later that night, the patient was noted to be unresponsive by his nurse with oxygen saturations in the 40s. Assisted ventilation was provided with BVM resulting in improved oxygen saturation into the upper 80s. He was given Narcan and within 2 minutes was more alert and complaining of pain. He was placed on nonrebreather with 15 L/m of oxygen to maintain saturations 94-90 percent and so he was placed on BiPAP. After the administration of Narcan the rapid response nurse noted him to be alert and oriented 4, moving all extremities purposefully with intact sensation throughout.  On the evening of 03/06/17, he again became lethargic and confused. He was seen by the primary physician who described him as "confused, talking gibberish, speech garbled, not following any commands." She called  code stroke and he was evaluated by neurology, Dr. Leonel Ramsay. On his assessment, he noted the patient to be initially aphasic and unable to follow commands. However, this seemed to improve somewhat over the course of his examination. The patient was sent for a stat CT scan of the brain and CT angiogram of the head. No acute abnormality was seen and there were no large vessel occlusions on his CTA. Dr. Leonel Ramsay felt that the patient may have had a stroke but cannot exclude metabolic encephalopathy. MRI scan of the brain, EEG, and serum ammonia were recommended. ABG was performed and showed a pH of 7.25 and PCO2 of 50. He is placed on BiPAP and transferred to the ICU. He has remained significantly encephalopathic with presumed etiologies including sepsis, hyponatremia, hypocalcemia, acute renal dysfunction, and hyperammonemia. EEG was obtained today and showed moderate diffuse generalized slowing with more focal slowing and breach artifact over the right frontal region. No epileptiform abnormalities or seizures were described. MRI scan of the brain has not yet been performed and was deferred by the PCCM as it was felt that his encephalopathy was more likely to be metabolic in etiology.  The patient is unable to participate in review of systems because of his encephalopathy.   Medications reviewed and reconciled.   Pertinent meds: Ceftriaxone 2 g every 24 hours Hydrocortisone 25 mg every 12 hours Thiamine 100 mg daily  Current Meds:   Current Facility-Administered Medications:  .  acetaminophen (TYLENOL) tablet 650 mg, 650 mg, Oral, Q6H PRN **OR** acetaminophen (TYLENOL) suppository 650 mg, 650 mg, Rectal, Q6H PRN, Danford, Christopher P, MD .  albuterol (PROVENTIL) (2.5 MG/3ML) 0.083%  nebulizer solution 2.5 mg, 2.5 mg, Nebulization, Q2H PRN, Chesley Mires, MD, 2.5 mg at 03/08/17 1154 .  cefTRIAXone (ROCEPHIN) 2 g in dextrose 5 % 50 mL IVPB, 2 g, Intravenous, Q24H, Zada Finders, MD, Last Rate: 100  mL/hr at 03/08/17 1736, 2 g at 03/08/17 1736 .  chlorhexidine (PERIDEX) 0.12 % solution 15 mL, 15 mL, Mouth Rinse, BID, Javier Glazier, MD, 15 mL at 03/08/17 0725 .  fentaNYL (SUBLIMAZE) injection 25-50 mcg, 25-50 mcg, Intravenous, Q2H PRN, Chesley Mires, MD, 50 mcg at 03/08/17 1638 .  haloperidol lactate (HALDOL) injection 2 mg, 2 mg, Intravenous, Q6H PRN, Sharia Reeve, MD, 2 mg at 03/08/17 0145 .  heparin injection 5,000 Units, 5,000 Units, Subcutaneous, Q8H, MastersJake Church, RPH, 5,000 Units at 03/08/17 1309 .  hydrocortisone sodium succinate (SOLU-CORTEF) 100 MG injection 25 mg, 25 mg, Intravenous, Q12H, Javier Glazier, MD, 25 mg at 03/08/17 1221 .  labetalol (NORMODYNE,TRANDATE) injection 10 mg, 10 mg, Intravenous, Q2H PRN, Sood, Vineet, MD .  levothyroxine (SYNTHROID, LEVOTHROID) injection 75 mcg, 75 mcg, Intravenous, Daily, Guy Begin, MD, 75 mcg at 03/08/17 0919 .  MEDLINE mouth rinse, 15 mL, Mouth Rinse, q12n4p, Javier Glazier, MD, 15 mL at 03/08/17 1600 .  naloxone [facility]) injection 0.4 mg, 0.4 mg, Intravenous, PRN, Rai, Ripudeep K, MD, 0.4 mg at 03/05/17 2218 .  ondansetron (ZOFRAN) tablet 4 mg, 4 mg, Oral, Q6H PRN **OR** ondansetron (ZOFRAN) injection 4 mg, 4 mg, Intravenous, Q6H PRN, Danford, Suann Larry, MD, 4 mg at 03/06/17 2216 .  thiamine (B-1) injection 100 mg, 100 mg, Intravenous, Daily, Javier Glazier, MD, 100 mg at 03/08/17 0919  Objective:  Temp:  [97.6 F (36.4 C)-99 F (37.2 C)] 98.7 F (37.1 C) (05/14 1517) Pulse Rate:  [73-93] 82 (05/14 1600) Resp:  [12-20] 16 (05/14 1600) BP: (109-191)/(63-104) 166/88 (05/14 1600) SpO2:  [88 %-97 %] 94 % (05/14 1600)  General: WD obese Caucasian man lying in hospital bed. He is restless, moving all extremities with legs partly hanging off the bed when I entered the room. He is talking and moaning continuously, but his speech is severely dysarthric and very difficult to understand. He is obviously  confused. He did not follow any commands for me. HEENT: Neck is supple without lymphadenopathy. Mucous membranes appear moist but the oral pharynx was not well visualized. Sclerae are anicteric. There is no conjunctival injection.  CV: Regular, distant, no obvious murmur. Carotid pulses are 2+ and symmetric with no bruits. Distal pulses 2+ and symmetric.  Lungs: CTAB on anterior exam. Extremities: No C/C/E. Neuro: MS: As noted above.  CN: Pupils are equal and reactive from 3-->2 mm bilaterally. He blinks to visual threat. Eyes appear conjugate. No forced deviation or nystagmus is seen. Corneals are intact and symmetric. His face appears grossly symmetric and he has a symmetric grimace. Hearing seems intact to conversational voice. The remainder of his cranial nerves could not be adequately assessed as he does not participate with the examination.  Motor: Normal bulk, tone. He moves all 4 extremities with grossly normal strength. No tremor or other abnormal movements are observed.  Sensation: He withdraws from mild noxious stimuli 4.  DTRs: 2+, symmetric. Toes are downgoing bilaterally. No pathological reflexes.  Coordination/gait: These cannot be assessed as the patient is unable to participate with the examination.   Labs: Lab Results  Component Value Date   WBC 15.2 (H) 03/08/2017   HGB 11.7 (L) 03/08/2017   HCT 33.7 (L) 03/08/2017  PLT 133 (L) 03/08/2017   GLUCOSE 100 (H) 03/08/2017   CHOL 145 01/04/2017   TRIG 125.0 01/04/2017   HDL 49.90 01/04/2017   LDLCALC 70 01/04/2017   ALT 82 (H) 03/08/2017   AST 67 (H) 03/08/2017   NA 136 03/08/2017   K 4.2 03/08/2017   CL 105 03/08/2017   CREATININE 5.60 (H) 03/08/2017   BUN 44 (H) 03/08/2017   CO2 18 (L) 03/08/2017   TSH <0.010 (L) 03/07/2017   PSA 0.30 06/28/2014   INR 1.16 03/05/2017   CBC Latest Ref Rng & Units 03/08/2017 03/07/2017 03/06/2017  WBC 4.0 - 10.5 K/uL 15.2(H) 14.1(H) 16.1(H)  Hemoglobin 13.0 - 17.0 g/dL 11.7(L)  11.9(L) 12.7(L)  Hematocrit 39.0 - 52.0 % 33.7(L) 35.7(L) 37.3(L)  Platelets 150 - 400 K/uL 133(L) 122(L) 134(L)    No results found for: HGBA1C Lab Results  Component Value Date   ALT 82 (H) 03/08/2017   AST 67 (H) 03/08/2017   ALKPHOS 119 03/08/2017   BILITOT 0.8 03/08/2017    Radiology:  There is no new neuroimaging.  Other diagnostic studies:  EEG today showed evidence of breach artifact in the right frontal region with moderate diffuse generalized slowing, no epileptiform abnormalities or seizures.  A/P:   1. Acute encephalopathy: This is most likely a toxic-metabolic process that is multifactorial in etiology with potential contributions from infection, hyponatremia, renal dysfunction, hyperammonemia, and hypoxia. After speaking with his sister, sounds as if he likely has some cognitive impairment at baseline which would predispose him to encephalopathies from any and all causes. It would also predict delayed recovery from same. EEG did not show any evidence to suggest seizures or propensity for seizures. Could consider MRI scan of the brain, though he would likely require heavy sedation in order to obtain a diagnostic scan and I think this can likely be deferred for now as it is probably not going to change management and a significant way. Continue to optimize metabolic status as you are. Continue to treat any underlying infection. Minimize the use of opiates, benzos or any medication with strong anticholinergic properties as much as possible. Optimize sleep-wake cycles as much as you can by keeping the room bright with activity during the day and dark and quiet at night. For agitation, recommend low-dose haloperidol or an atypical antipsychotic.   2. Lumbar epidural abscess: Continue antibiotics. Will likely require rehabilitation services once his encephalopathy improves.  This was discussed with the patient's sister at length. Education was provided on the diagnosis and expected  evaluation and treatment. She is in agreement with the plan as noted. She was given the opportunity to ask any questions and these were addressed to her satisfaction.   A total of 40 minutes was spent on this consultation, including review of the patient's medical record, review of labs, review of available imaging, and lengthy discussion with his sister.  Melba Coon, MD [facility]

## 2017-03-08 NOTE — Procedures (Signed)
ELECTROENCEPHALOGRAM REPORT  Date of Study: 03/08/2017  Patient's Name: Lawrence Moore MRN: [id number] Date of Birth: 06-14-1951  Referring Provider: Dr. Chesley Mires  Clinical History: This is a 66 year old man with altered mental status.  Medications: acetaminophen (TYLENOL) tablet 650 mg  albuterol (PROVENTIL) (2.5 MG/3ML) 0.083% nebulizer solution 2.5 mg  cefTRIAXone (ROCEPHIN) 2 g in dextrose 5 % 50 mL IVPB  chlorhexidine (PERIDEX) 0.12 % solution 15 mL  fentaNYL (SUBLIMAZE) injection 25-50 mcg  haloperidol lactate (HALDOL) injection 2 mg  heparin injection 5,000 Units  hydrocortisone sodium succinate (SOLU-CORTEF) 100 MG injection 25 mg  labetalol (NORMODYNE,TRANDATE) injection 10 mg  levothyroxine (SYNTHROID, LEVOTHROID) injection 75 mcg  naloxone (NARCAN) injection 0.4 mg   Technical Summary: A multichannel digital EEG recording measured by the international 10-20 system with electrodes applied with paste and impedances below 5000 ohms performed as portable with EKG monitoring in an awake and asleep patient.  Hyperventilation and photic stimulation were not performed.  The digital EEG was referentially recorded, reformatted, and digitally filtered in a variety of bipolar and referential montages for optimal display.   Description: The patient is awake and asleep during the recording.  There is no clear posterior dominant rhythm. The background consists of a large amount of diffuse 4-5 Hz theta and 2-3 Hz delta slowing, with additional focal slowing and higher amplitude sharply contoured activity over the right frontal region. During drowsiness and sleep, there is an increase in theta and delta slowing of the background with poorly formed vertex waves seen. Hyperventilation and photic stimulation were not performed. There were occasional positive sharp transients seen over the right frontal region without clear field, suggestive of artifact or related to breach artifact. There  were no clear epileptiform discharges or electrographic seizures seen.    EKG lead was unremarkable.  Impression: This awake and asleep EEG is abnormal due to the presence of: 1. Moderate diffuse slowing of the waking background 2. Additional focal slowing over the right frontal region 3. Breach artifact over the right frontal region  Clinical Correlation of the above findings indicates diffuse cerebral dysfunction that is non-specific in etiology and can be seen with hypoxic/ischemic injury, toxic/metabolic encephalopathies, neurodegenerative disorders, or medication effect. Additional focal slowing over the right frontal region indicates focal cerebral dysfunction in this region suggestive of underlying structural or physiologic abnormality. Breach artifact is consistent with history of prior surgery.  The absence of epileptiform discharges does not rule out a clinical diagnosis of epilepsy.  Clinical correlation is advised.   Ellouise Newer, M.D.

## 2017-03-08 NOTE — Progress Notes (Signed)
eLink Physician-Brief Progress Note Patient Name: Lawrence Moore DOB: December 01, 1950 MRN: [id number]   Date of Service  03/08/2017  HPI/Events of Note  Called for increased WOB, some basilar crackles on exam  eICU Interventions  CXR now. His resp pattern on camera suggests may be a component of OSA as well     Intervention Category Intermediate Interventions: Respiratory distress - evaluation and management  BYRUM,ROBERT S. 03/08/2017, 9:44 PM

## 2017-03-08 NOTE — Progress Notes (Signed)
[facility]  Called to bedside to evaluated Mr. Lawry for increased work of breathing this evening. Chart reviewed, admitted with spinal abscess post R lumbar microdiscectomy, chart notes confusion post pain medicines.  He has baseline COPD.  Has been developing AKI here, renal consulted.  On exam  Vitals:   03/08/17 1900 03/08/17 2000 03/08/17 2003 03/08/17 2011  BP: (!) 158/113 (!) 179/95 (!) 179/95   Pulse: (!) 104 82 80   Resp: (!) 26 16 16    Temp:   98 F (36.7 C)   TempSrc:   Axillary   SpO2: (!) 86% 96% 96% 95%  Weight:      Height:        Intake/Output Summary (Last 24 hours) at 03/08/17 2252 Last data filed at 03/08/17 2101  Gross per 24 hour  Intake             2040 ml  Output              225 ml  Net             1815 ml     4L [place]  General:  tachypneic but not using accessory muscle use HENT: NCAT OP clear PULM: Crackles B, normal effort CV: RRR, no mgr GI: BS+, soft, nontender MSK: normal bulk and tone Neuro: confused, moaning, will speak some but confused  CXR images reviewed: bilateral airspace disease  CBC    Component Value Date/Time   WBC 15.2 (H) 03/08/2017 0322   RBC 3.79 (L) 03/08/2017 0322   HGB 11.7 (L) 03/08/2017 0322   HCT 33.7 (L) 03/08/2017 0322   PLT 133 (L) 03/08/2017 0322   MCV 88.9 03/08/2017 0322   MCV 89.4 04/15/2016 1341   MCH 30.9 03/08/2017 0322   MCHC 34.7 03/08/2017 0322   RDW 13.9 03/08/2017 0322   LYMPHSABS 0.3 (L) 03/08/2017 0322   MONOABS 0.2 03/08/2017 0322   EOSABS 0.0 03/08/2017 0322   BASOSABS 0.0 03/08/2017 0322   BMET    Component Value Date/Time   NA 136 03/08/2017 0322   K 4.2 03/08/2017 0322   CL 105 03/08/2017 0322   CO2 18 (L) 03/08/2017 0322   GLUCOSE 100 (H) 03/08/2017 0322   BUN 44 (H) 03/08/2017 0322   CREATININE 5.60 (H) 03/08/2017 0322   CALCIUM 7.4 (L) 03/08/2017 0322   GFRNONAA 10 (L) 03/08/2017 0322   GFRAA 11 (L) 03/08/2017 0322   procalcitonin elevated > 30  Impression: Acute  encephalopathy AKI Acute pulmonary edema Acute respiratory failure with hypoxemia   Plan: Diurese now> 80mg  IV lasix given, monitor output with foley carefully, if not putting out more than 200cc in first 90 minutes post administration give another 80mg  lasix or consider lasix gtt Hydralazline now to get SBP down under 160 No sedating meds Monitor carefully in ICU setting  My cc time 35 minutes  Roselie Awkward, MD [place] [facility] Pager: [phone] Cell: [phone] After 3pm or if no response, call [phone]

## 2017-03-09 ENCOUNTER — Inpatient Hospital Stay (HOSPITAL_COMMUNITY): Payer: Medicare Other

## 2017-03-09 LAB — CBC
HCT: 35.7 % — ABNORMAL LOW (ref 39.0–52.0)
Hemoglobin: 12.2 g/dL — ABNORMAL LOW (ref 13.0–17.0)
MCH: 31 pg (ref 26.0–34.0)
MCHC: 34.2 g/dL (ref 30.0–36.0)
MCV: 90.6 fL (ref 78.0–100.0)
Platelets: 118 10*3/uL — ABNORMAL LOW (ref 150–400)
RBC: 3.94 MIL/uL — ABNORMAL LOW (ref 4.22–5.81)
RDW: 14.6 % (ref 11.5–15.5)
WBC: 16.2 10*3/uL — ABNORMAL HIGH (ref 4.0–10.5)

## 2017-03-09 LAB — CULTURE, BLOOD (ROUTINE X 2)
CULTURE: NO GROWTH
Culture: NO GROWTH
SPECIAL REQUESTS: ADEQUATE
Special Requests: ADEQUATE

## 2017-03-09 LAB — RENAL FUNCTION PANEL
ALBUMIN: 2.4 g/dL — AB (ref 3.5–5.0)
ANION GAP: 15 (ref 5–15)
BUN: 67 mg/dL — AB (ref 6–20)
CALCIUM: 8.1 mg/dL — AB (ref 8.9–10.3)
CO2: 13 mmol/L — ABNORMAL LOW (ref 22–32)
CREATININE: 6.72 mg/dL — AB (ref 0.61–1.24)
Chloride: 109 mmol/L (ref 101–111)
GFR calc Af Amer: 9 mL/min — ABNORMAL LOW (ref >60)
GFR calc non Af Amer: 8 mL/min — ABNORMAL LOW (ref >60)
GLUCOSE: 70 mg/dL (ref 65–99)
PHOSPHORUS: 7.1 mg/dL — AB (ref 2.5–4.6)
Potassium: 4.8 mmol/L (ref 3.5–5.1)
SODIUM: 137 mmol/L (ref 135–145)

## 2017-03-09 LAB — URINE CULTURE
Culture: NO GROWTH
Special Requests: NORMAL

## 2017-03-09 LAB — BLOOD GAS, ARTERIAL
Acid-base deficit: 9.6 mmol/L — ABNORMAL HIGH (ref 0.0–2.0)
Acid-base deficit: 8.3 mmol/L — ABNORMAL HIGH (ref 0.0–2.0)
BICARBONATE: 18 mmol/L — AB (ref 20.0–28.0)
Bicarbonate: 17.8 mmol/L — ABNORMAL LOW (ref 20.0–28.0)
DRAWN BY: 34560
DRAWN BY: 274071
FIO2: 100
FIO2: 60
LHR: 24 {breaths}/min
MECHVT: 480 mL
O2 Saturation: 98.2 %
O2 Saturation: 98 %
PATIENT TEMPERATURE: 98.6
PCO2 ART: 45.9 mmHg (ref 32.0–48.0)
PEEP: 5 cmH2O
PEEP: 5 cmH2O
PO2 ART: 118 mmHg — AB (ref 83.0–108.0)
Patient temperature: 98.6
RATE: 16 resp/min
VT: 480 mL
pCO2 arterial: 53.9 mmHg — ABNORMAL HIGH (ref 32.0–48.0)
pH, Arterial: 7.145 — CL (ref 7.350–7.450)
pH, Arterial: 7.219 — ABNORMAL LOW (ref 7.350–7.450)
pO2, Arterial: 153 mmHg — ABNORMAL HIGH (ref 83.0–108.0)

## 2017-03-09 LAB — PHOSPHORUS
Phosphorus: 7.4 mg/dL — ABNORMAL HIGH (ref 2.5–4.6)
Phosphorus: 7.6 mg/dL — ABNORMAL HIGH (ref 2.5–4.6)

## 2017-03-09 LAB — GLUCOSE, CAPILLARY
GLUCOSE-CAPILLARY: 74 mg/dL (ref 65–99)
GLUCOSE-CAPILLARY: 65 mg/dL (ref 65–99)
GLUCOSE-CAPILLARY: 117 mg/dL — AB (ref 65–99)
GLUCOSE-CAPILLARY: 89 mg/dL (ref 65–99)
GLUCOSE-CAPILLARY: 96 mg/dL (ref 65–99)
GLUCOSE-CAPILLARY: 109 mg/dL — AB (ref 65–99)
Glucose-Capillary: 78 mg/dL (ref 65–99)

## 2017-03-09 LAB — PROCALCITONIN: PROCALCITONIN: 20.21 ng/mL

## 2017-03-09 LAB — VANCOMYCIN, RANDOM: VANCOMYCIN RM: 33

## 2017-03-09 LAB — MAGNESIUM
Magnesium: 2.1 mg/dL (ref 1.7–2.4)
Magnesium: 2.1 mg/dL (ref 1.7–2.4)

## 2017-03-09 MED ORDER — DEXTROSE 50 % IV SOLN
INTRAVENOUS | Status: AC
  Administered 2017-03-09: 25 mL
  Filled 2017-03-09: qty 50

## 2017-03-09 MED ORDER — FAMOTIDINE IN NACL 20-0.9 MG/50ML-% IV SOLN
20.0000 mg | Freq: Every day | INTRAVENOUS | Status: DC
  Administered 2017-03-09 – 2017-03-13 (×5): 20 mg via INTRAVENOUS
  Filled 2017-03-09 (×5): qty 50

## 2017-03-09 MED ORDER — ORAL CARE MOUTH RINSE: 15.0000 mL | Freq: Four times a day (QID) | OROMUCOSAL | Status: DC

## 2017-03-09 MED ORDER — CHLORHEXIDINE GLUCONATE 0.12% ORAL RINSE (MEDLINE KIT)
15.0000 mL | Freq: Two times a day (BID) | OROMUCOSAL | Status: DC
  Administered 2017-03-09 – 2017-03-15 (×12): 15 mL via OROMUCOSAL

## 2017-03-09 MED ORDER — PRO-STAT SUGAR FREE PO LIQD
30.0000 mL | Freq: Two times a day (BID) | ORAL | Status: DC
  Filled 2017-03-09: qty 30

## 2017-03-09 MED ORDER — FUROSEMIDE 10 MG/ML IJ SOLN
160.0000 mg | Freq: Three times a day (TID) | INTRAVENOUS | Status: DC
  Administered 2017-03-09 – 2017-03-15 (×18): 160 mg via INTRAVENOUS
  Filled 2017-03-09 (×2): qty 16
  Filled 2017-03-09: qty 4
  Filled 2017-03-09 (×3): qty 16
  Filled 2017-03-09: qty 2
  Filled 2017-03-09: qty 16
  Filled 2017-03-09: qty 2
  Filled 2017-03-09 (×4): qty 16
  Filled 2017-03-09: qty 4
  Filled 2017-03-09 (×2): qty 16
  Filled 2017-03-09: qty 10
  Filled 2017-03-09 (×4): qty 16

## 2017-03-09 MED ORDER — CHLORHEXIDINE GLUCONATE 0.12% ORAL RINSE (MEDLINE KIT)
15.0000 mL | Freq: Two times a day (BID) | OROMUCOSAL | Status: DC
  Administered 2017-03-09: 15 mL via OROMUCOSAL

## 2017-03-09 MED ORDER — ORAL CARE MOUTH RINSE
15.0000 mL | Freq: Four times a day (QID) | OROMUCOSAL | Status: DC
  Administered 2017-03-10 – 2017-03-15 (×23): 15 mL via OROMUCOSAL

## 2017-03-09 MED ORDER — FENTANYL CITRATE (PF) 100 MCG/2ML IJ SOLN: 50.0000 ug | Freq: Once | INTRAMUSCULAR | Status: DC

## 2017-03-09 MED ORDER — FENTANYL CITRATE (PF) 100 MCG/2ML IJ SOLN
INTRAMUSCULAR | Status: AC
  Administered 2017-03-09: 50 ug
  Filled 2017-03-09: qty 2

## 2017-03-09 MED ORDER — MIDAZOLAM HCL 2 MG/2ML IJ SOLN
2.0000 mg | INTRAMUSCULAR | Status: DC | PRN
  Administered 2017-03-09 – 2017-03-14 (×12): 2 mg via INTRAVENOUS
  Filled 2017-03-09 (×11): qty 2

## 2017-03-09 MED ORDER — FENTANYL BOLUS VIA INFUSION
50.0000 ug | INTRAVENOUS | Status: DC | PRN
  Administered 2017-03-10 – 2017-03-12 (×3): 50 ug via INTRAVENOUS
  Filled 2017-03-09: qty 50

## 2017-03-09 MED ORDER — ETOMIDATE 2 MG/ML IV SOLN
20.0000 mg | Freq: Once | INTRAVENOUS | Status: AC
  Administered 2017-03-09: 20 mg via INTRAVENOUS

## 2017-03-09 MED ORDER — VITAL HIGH PROTEIN PO LIQD: 1000.0000 mL | ORAL | Status: DC

## 2017-03-09 MED ORDER — ROCURONIUM BROMIDE 50 MG/5ML IV SOLN
90.0000 mg | Freq: Once | INTRAVENOUS | Status: AC
  Administered 2017-03-09: 90 mg via INTRAVENOUS

## 2017-03-09 MED ORDER — MIDAZOLAM HCL 2 MG/2ML IJ SOLN
INTRAMUSCULAR | Status: AC
  Filled 2017-03-09: qty 2

## 2017-03-09 MED ORDER — MIDAZOLAM HCL 2 MG/2ML IJ SOLN
INTRAMUSCULAR | Status: AC
  Administered 2017-03-09: 2 mg
  Filled 2017-03-09: qty 2

## 2017-03-09 MED ORDER — VITAL HIGH PROTEIN PO LIQD
1000.0000 mL | ORAL | Status: DC
  Administered 2017-03-09 – 2017-03-15 (×6): 1000 mL
  Filled 2017-03-09 (×3): qty 1000

## 2017-03-09 MED ORDER — SODIUM BICARBONATE 8.4 % IV SOLN
INTRAVENOUS | Status: DC
  Administered 2017-03-09 – 2017-03-12 (×4): via INTRAVENOUS
  Filled 2017-03-09 (×7): qty 150

## 2017-03-09 MED ORDER — ORAL CARE MOUTH RINSE
15.0000 mL | Freq: Four times a day (QID) | OROMUCOSAL | Status: DC
  Administered 2017-03-09 (×2): 15 mL via OROMUCOSAL

## 2017-03-09 MED ORDER — FENTANYL 2500MCG IN NS 250ML (10MCG/ML) PREMIX INFUSION
25.0000 ug/h | INTRAVENOUS | Status: DC
  Administered 2017-03-09: 100 ug/h via INTRAVENOUS
  Administered 2017-03-09: 150 ug/h via INTRAVENOUS
  Administered 2017-03-10: 175 ug/h via INTRAVENOUS
  Filled 2017-03-09 (×3): qty 250

## 2017-03-09 MED ORDER — MIDAZOLAM HCL 2 MG/2ML IJ SOLN
2.0000 mg | INTRAMUSCULAR | Status: AC | PRN
  Administered 2017-03-11 – 2017-03-13 (×3): 2 mg via INTRAVENOUS
  Filled 2017-03-09 (×5): qty 2

## 2017-03-09 MED ORDER — FUROSEMIDE 10 MG/ML IJ SOLN
80.0000 mg | Freq: Once | INTRAMUSCULAR | Status: AC
  Administered 2017-03-09: 80 mg via INTRAVENOUS
  Filled 2017-03-09: qty 8

## 2017-03-09 NOTE — Procedures (Signed)
Intubation Procedure Note Lawrence Moore [id number] [birth year]  Procedure: Intubation Indications: Respiratory insufficiency  Procedure Details Consent: Unable to obtain consent because of altered level of consciousness. Time Out: Verified patient identification, verified procedure, site/side was marked, verified correct patient position, special equipment/implants available, medications/allergies/relevent history reviewed, required imaging and test results available.  Performed  Drugs Etomidate 20mg , Rocuronium 90mg , versed 2mg , fentanyl 50mcg DL x 1 with GS 4 blade Grade 1 view 7.5 ET tube passed through cords under direct visualization Placement confirmed with bilateral breath sounds, positive EtCO2 change and smoke in tube   Evaluation Hemodynamic Status: BP stable throughout; O2 sats: stable throughout Patient's Current Condition: stable Complications: No apparent complications Patient did tolerate procedure well. Chest X-ray ordered to verify placement.  CXR: pending.   Lawrence Moore 03/09/2017

## 2017-03-09 NOTE — Progress Notes (Signed)
PULMONARY / CRITICAL CARE MEDICINE   Name: Lawrence Moore MRN: [id number] DOB: June 01, 1951    ADMISSION DATE:  03/16/2017 CONSULTATION DATE:  03/08/2017  REFERRING MD:  Dr. Kendrick Fries   CHIEF COMPLAINT:  Spinal abscess post right lumbar microdiscectomy  Brief:   66 year old male with PMH of anxiety, asthma, depression, DM, GERD, Hyperlipidemia, hypothyroidism, COPD  Admitted 5/10 with a spinal epidural abscess after a routine lumbar laminectomy for a disc rupture one week prior. MRI revealed new L4-L5 abscess. Underwent IR aspiration on 5/11. Pain had been hard to control and patient was requiring multiple PRN doses of dilaudid. Later in the day on 5/11 patient was found unresponsive with oxygen saturation in the 40s. He was given Narcan and responded well. However, later in the day patient was found aphasic and altered, again responsive to narcan, Neurology consulted, Head CT negative. Patient transferred to ICU and placed on BiPAP, ABG 7.2/50/82. Patient with liver and renal failure with elevated ammonia level. Nephrology consulted. Overnight 5/14 patient SOB requiring lasix administration and ultimately intubation.   SUBJECTIVE:  Remains intubated on fentanyl gtt   VITAL SIGNS: BP 115/61   Pulse 72   Temp 97.4 F (36.3 C) (Oral)   Resp (!) 23   Ht 5\' 4"  (1.626 m)   Wt 99 kg (218 lb 4.8 oz)   SpO2 99%   BMI 37.47 kg/m   HEMODYNAMICS:    VENTILATOR SETTINGS: Vent Mode: PRVC FiO2 (%):  [60 %-100 %] 60 % Set Rate:  [16 bmp-24 bmp] 24 bmp Vt Set:  [480 mL] 480 mL PEEP:  [5 cmH20] 5 cmH20 Plateau Pressure:  [26 cmH20-29 cmH20] 29 cmH20  INTAKE / OUTPUT: I/O last 3 completed shifts: In: 2583.3 [I.V.:2183.3; Other:150; IV Piggyback:250] Out: 419 [Urine:775]  PHYSICAL EXAMINATION: General appearance: Male well nourished no distress Eyes:  PERRL Mouth:  membranes and no mucosal ulcerations Neck: Trachea midline; neck supple, no JVD Lungs/chest: Coarse, crackles at bases,  with normal respiratory effort  Neuro: Sedated, responds to verbal stimuli, withdrawals to pain  CV: RRR, no MRGs  Abdomen: Soft, non-tender; active bowel sounds  Extremities: +2 edema BLE  Skin: warm, dry, intact    LABS:  BMET  Recent Labs Lab 03/07/17 1631 03/08/17 0322 03/09/17 0628  NA 133* 136 137  K 5.0 4.2 4.8  CL 103 105 109  CO2 19* 18* 13*  BUN 36* 44* 67*  CREATININE 4.95* 5.60* 6.72*  GLUCOSE 108* 100* 70    Electrolytes  Recent Labs Lab 02/25/2017 2324  03/07/17 1631 03/08/17 0322 03/09/17 0628  CALCIUM  --   < > 7.4* 7.4* 8.1*  MG 1.6*  --   --  1.6*  --   PHOS  --   --   --  5.5* 7.1*  < > = values in this interval not displayed.  CBC  Recent Labs Lab 03/07/17 0505 03/08/17 0322 03/09/17 0628  WBC 14.1* 15.2* 16.2*  HGB 11.9* 11.7* 12.2*  HCT 35.7* 33.7* 35.7*  PLT 122* 133* 118*    Coag's  Recent Labs Lab 03/05/17 0641  APTT 32  INR 1.16    Sepsis Markers  Recent Labs Lab 03/17/2017 2041 03/06/17 2033 03/07/17 0048 03/07/17 1031 03/08/17 0322 03/09/17 0628  LATICACIDVEN 1.01 2.2* 1.7  --   --   --   PROCALCITON  --   --   --  38.31 33.36 20.21    ABG  Recent Labs Lab 03/06/17 2145 03/07/17 0135 03/09/17 0118  PHART 7.240* 7.197* 7.145*  PCO2ART 58.4* 57.7* 53.9*  PO2ART 74.4* 87.1 153*    Liver Enzymes  Recent Labs Lab 03/06/17 2033 03/07/17 0505 03/08/17 0322 03/09/17 0628  AST 102* 82* 67*  --   ALT 120* 108* 82*  --   ALKPHOS 106 112 119  --   BILITOT 2.1* 1.1 0.8  --   ALBUMIN 2.6* 2.6* 2.6* 2.4*    Cardiac Enzymes No results for input(s): TROPONINI, PROBNP in the last 168 hours.  Glucose  Recent Labs Lab 03/08/17 1515 03/08/17 2006 03/09/17 0010 03/09/17 0343 03/09/17 0746 03/09/17 1108  GLUCAP 71 90 78 74 65 89    Imaging Dg Chest Port 1 View  Result Date: 03/09/2017 CLINICAL DATA:  Respiratory failure.  Intubation. EXAM: PORTABLE CHEST 1 VIEW COMPARISON:  Radiograph yesterday  at 2156 hour FINDINGS: Endotracheal tube is 1.7 cm from the carina. Enteric tube in place, tip below the diaphragm not included in the field of view. Progressive diffuse bilateral interstitial and alveolar airspace opacities from prior exam. Increased pleural effusions. Mediastinal contours are obscured. IMPRESSION: Endotracheal tube 1.7 cm from the carina. Tip of the enteric tube below the diaphragm in the stomach. Progressive bilateral diffuse airspace opacities suspicious for pulmonary edema. Increased pleural effusions. Electronically Signed   By: Jeb Levering M.D.   On: 03/09/2017 01:03   Dg Chest Port 1 View  Result Date: 03/08/2017 CLINICAL DATA:  Acute respiratory failure. EXAM: PORTABLE CHEST 1 VIEW COMPARISON:  Radiograph 03/08/2017 FINDINGS: Development of diffuse bilateral interstitial and alveolar opacities since prior exam. Slightly more confluent opacity persists at the right lung base. There is also a slightly more confluent component in the right upper lobe abutting the fissure. Probable bilateral pleural effusions. Heart is at the upper limits of normal in size, partially obscured. No pneumothorax. IMPRESSION: Development of diffuse bilateral interstitial and alveolar opacities suspicious for pulmonary edema, less likely atypical infection. Slightly more confluent opacities in the right lung base and right upper lobe abutting the fissure may be vascular, underlying pneumonia or aspiration. Suspect bilateral pleural effusions. Electronically Signed   By: Jeb Levering M.D.   On: 03/08/2017 22:13     STUDIES:  MRI L-SPINE W/ & W/O 5/10: IMPRESSION: 1. Status post right L4 laminectomy with fluid collection in the operative bed causing mass effect on the dorsal aspect of the thecal sac with worsened narrowing of thecal sac compared to the preoperative study. 2. Unchanged distortion of the right lateral recess at L5-S1 secondary to the presence of granulation tissue. PORT CXR 5/11:   Personally reviewed by me. Patchy opacification within right mid and lower lung zone. Full left hilum. No pleural effusion appreciated. CT HEAD/ANGIO NECK/HEAD 5/12: IMPRESSION: 1. Negative for emergent large vessel occlusion. 2. Diminutive intracranial vessels and moderate distal basilar stenosis. 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 3. High-grade stenosis at the origin of the non dominant right vertebral artery. 4. No carotid stenosis in the neck. COMPLETE ABD U/S 5/13:  Surgical absence of the gallbladder. No bile duct dilatation. Severe fatty infiltration of the liver. Minimal perinephric fluid noted along the lower pole of the left kidney, nonspecific. EEG 5/14 > Moderate diffuse slowing of the background, focal slowing over the right frontal region   CULTURES: MRSA PCR 4/30:  Negative  Blood Cultures x2 5/10 >>> Urine Culture 5/10:  Negative  HIV 5/11:  Nonreactive  L-4 Laminectomy Bed Culture 5/11 >>> Blood Cultures x2 5/12 >>> Stool C diff PCR 5/12:  Negative   ANTIBIOTICS: Rocephin 5/11 >>> Vancomycin 5/11 >>>  SIGNIFICANT EVENTS: 5/2 > L4-5 microdiscectomy  5/10> Admitted  5/11 > IR aspiration, respiratory distress responded to narcan, another event later in day did not respond, code stroke called > negative  5/12 > Solu-Cortef 100 q8h (Patient on Prednisone 5 mg outpatient)  5/13 > Solu-Cortef decreased to basal dosing  5/15 > Intubated   LINES/TUBES: ETT 5/15 >>   DISCUSSION: 66 year old male with epidural abscess now in acute liver and renal failure.   ASSESSMENT / PLAN:  PULMONARY A: Acute on Chronic Hypercarbic respiratory Failure in sitting of pulmonary edema  H/O COPD  P:   Vent Support  Wean as tolerated  Follow CXR  Maintain oxygen >90 PRN BD  Pulmonary Hygiene   CARDIOVASCULAR A:  HTN P:  Cardiac Monitoring  PRN labetalol for SBP > 170  RENAL A:   Acute renal failure (Bas Cr 1.1) Non anion gap metabolic  acidosis  +Volume Overload  P:   Nephrology following > may need HD  Trend BMP Avoid Nephrotoxic medications  Lasix 160 q8h Bicarb with dextrose at 50 ml/hr   GASTROINTESTINAL A:   Hepatic Failure  Elevated Ammonia  P:   Trend ammonia > now 26  Trend LFT   HEMATOLOGIC A:   Thrombocytopenia  P:  Trend CBC   INFECTIOUS A:   Epidural Abscess s/p discectomy on 5/2, IR on 5/11 for aspiration -Procal 33.36 on 5/14   P:   Trend WBC and Fever Curve ID following  Continue Rocephin   ENDOCRINE A:   Hypopituitarism  Hypothyroid    P:   Continue Synthroid  Continue Solu-Cortef    NEUROLOGIC A:   Metabolic Encephalopathy in setting of Uremia and elevated ammonia  -EEG negative  P:   RASS goal: 0/-1  Monitor  Neurology Following  Hold outpatient Prozac and Cymbalta  Wean fentanyl gtt for RASS goal  Haldol PRN    FAMILY  - Updates: Family updated at bedside   - Inter-disciplinary family meet or Palliative Care meeting due by: 03/13/2017    CC Time: 38 minutes   Hayden Pedro, AGACNP-BC [facility]  Pgr: [phone]  PCCM Pgr: [phone]   STAFF NOTE: Linwood Dibbles, MD FACP have personally reviewed patient's available data, including medical history, events of note, physical examination and test results as part of my evaluation. I have discussed with resident/NP and other care providers such as pharmacist, RN and RRT. In addition, I personally evaluated patient and elicited key findings of: sedated rass -3, edema increased, coarse crackles, pcxr which I personally reviewed showed patchy infiltrates likely c/w edema, r/o asp, last abg noted after intubation, with acidosis, has NONAG - agree add bicarb drip, lasix escalation to neg balance, , abg noted, may need higher rate, and goal to lower to 40% given last pao2, ABG repeat now, no sbt able as of now, feeding pt start now, may need to move to HD, coag neg staph  - vanc /  rocephin, pcxr repeat in am , I updated med poa sister in full in room The patient is critically ill with multiple organ systems failure and requires high complexity decision making for assessment and support, frequent evaluation and titration of therapies, application of advanced monitoring technologies and extensive interpretation of multiple databases.   Critical Care Time devoted to patient care services described in this note is 35 Minutes. This time reflects time of care of this signee:  Merrie Roof, MD FACP. This critical care time does not reflect procedure time, or teaching time or supervisory time of PA/NP/Med student/Med Resident etc but could involve care discussion time. Rest per NP/medical resident whose note is outlined above and that I agree with   [name]. Titus Mould, MD, [place] Pgr: [facility] 03/09/2017 12:42 PM

## 2017-03-09 NOTE — Progress Notes (Signed)
[facility]  Came back to assess: minimal urine output, increased work of breathing, more hypoxmic.  I intubated him, vent and sedation orders placed.  Full rounding note to follow from day team.  Roselie Awkward, MD [place] [facility] Pager: [phone] Cell: [phone] After 3pm or if no response, call [phone]

## 2017-03-09 NOTE — Progress Notes (Signed)
Neurology Progress Note  Subjective: The patient had an uneventful night. He developed increased work of breathing with checks her x-ray consistent with pulmonary edema. He was given IV Lasix with minimal urine output. He was initially placed on BiPAP. He developed worsening hypoxemia and was subsequently intubated.  The patient is unable to participate in review of systems because of his encephalopathy.   Medications reviewed and reconciled.   Pertinent meds: Ceftriaxone 2 g every 24 hours Hydrocortisone 25 mg every 12 hours Thiamine 100 mg daily  Current Meds:   Current Facility-Administered Medications:  .  acetaminophen (TYLENOL) tablet 650 mg, 650 mg, Oral, Q6H PRN **OR** acetaminophen (TYLENOL) suppository 650 mg, 650 mg, Rectal, Q6H PRN, Danford, Christopher P, MD .  albuterol (PROVENTIL) (2.5 MG/3ML) 0.083% nebulizer solution 2.5 mg, 2.5 mg, Nebulization, Q2H PRN, Chesley Mires, MD, 2.5 mg at 03/08/17 2011 .  cefTRIAXone (ROCEPHIN) 2 g in dextrose 5 % 50 mL IVPB, 2 g, Intravenous, Q24H, Zada Finders, MD, Stopped at 03/08/17 1806 .  chlorhexidine (PERIDEX) 0.12 % solution 15 mL, 15 mL, Mouth Rinse, BID, Javier Glazier, MD, 15 mL at 03/09/17 0032 .  chlorhexidine gluconate (MEDLINE KIT) (PERIDEX) 0.12 % solution 15 mL, 15 mL, Mouth Rinse, BID, McQuaid, Douglas B, MD, 15 mL at 03/09/17 0800 .  famotidine (PEPCID) IVPB 20 mg premix, 20 mg, Intravenous, Daily, McQuaid, Douglas B, MD, Last Rate: 100 mL/hr at 03/09/17 0921, 20 mg at 03/09/17 0921 .  fentaNYL (SUBLIMAZE) bolus via infusion 50 mcg, 50 mcg, Intravenous, Q1H PRN, [name] B, MD .  fentaNYL (SUBLIMAZE) injection 50 mcg, 50 mcg, Intravenous, Once, McQuaid, Douglas B, MD .  fentaNYL 2559mg in NS 2573m(1040mml) infusion-PREMIX, 25-400 mcg/hr, Intravenous, Continuous, McQuaid, Douglas B, MD, Last Rate: 10 mL/hr at 03/09/17 0900, 100 mcg/hr at 03/09/17 0900 .  furosemide (LASIX) 160 mg in dextrose 5 % 50 mL IVPB, 160  mg, Intravenous, Q8H, GolCorliss ParishD .  haloperidol lactate (HALDOL) injection 2 mg, 2 mg, Intravenous, Q6H PRN, PanSharia ReeveD, 2 mg at 03/08/17 0145 .  heparin injection 5,000 Units, 5,000 Units, Subcutaneous, Q8H, Masters, AJake ChurchPH, 5,000 Units at 03/09/17 0607 .  hydrALAZINE (APRESOLINE) injection 10-40 mg, 10-40 mg, Intravenous, Q4H PRN, McQuaid, Douglas B, MD, 20 mg at 03/08/17 2256 .  hydrocortisone sodium succinate (SOLU-CORTEF) 100 MG injection 25 mg, 25 mg, Intravenous, Q12H, NesJavier GlazierD, 25 mg at 03/09/17 0032 .  labetalol (NORMODYNE,TRANDATE) injection 10 mg, 10 mg, Intravenous, Q2H PRN, SooChesley MiresD, 10 mg at 03/08/17 2113 .  levothyroxine (SYNTHROID, LEVOTHROID) injection 75 mcg, 75 mcg, Intravenous, Daily, GidGuy BeginD, 75 mcg at 03/09/17 0910 .  MEDLINE mouth rinse, 15 mL, Mouth Rinse, q12n4p, NesJavier GlazierD, 15 mL at 03/08/17 1600 .  MEDLINE mouth rinse, 15 mL, Mouth Rinse, QID, McQuaid, Douglas B, MD, 15 mL at 03/09/17 0411 .  midazolam (VERSED) injection 2 mg, 2 mg, Intravenous, Q15 min PRN, McQSimonne Maffucci MD .  midazolam (VERSED) injection 2 mg, 2 mg, Intravenous, Q2H PRN, McQSimonne Maffucci MD, 2 mg at 03/09/17 0345 .  morphine 2 MG/ML injection 2-4 mg, 2-4 mg, Intravenous, Q2H PRN, ByrCollene GobbleD .  naloxone (NASelect [facility] Hospital - Memphisnjection 0.4 mg, 0.4 mg, Intravenous, PRN, Rai, Ripudeep K, MD, 0.4 mg at 03/05/17 2218 .  ondansetron (ZOFRAN) tablet 4 mg, 4 mg, Oral, Q6H PRN **OR** ondansetron (ZOFRAN) injection 4 mg, 4 mg, Intravenous, Q6H PRN, Danford, ChrSuann LarryD, 4  mg at 03/06/17 2216 .  sodium bicarbonate 150 mEq in dextrose 5 % 1,000 mL infusion, , Intravenous, Continuous, Corliss Parish, MD .  thiamine (B-1) injection 100 mg, 100 mg, Intravenous, Daily, Javier Glazier, MD, 100 mg at 03/09/17 0916  Objective:  Temp:  [97.3 F (36.3 C)-98.7 F (37.1 C)] 97.8 F (36.6 C) (05/15 0748) Pulse Rate:  [69-104]  72 (05/15 0600) Resp:  [14-26] 24 (05/15 0600) BP: (84-188)/(41-113) 117/62 (05/15 0600) SpO2:  [86 %-100 %] 100 % (05/15 0600) FiO2 (%):  [60 %-100 %] 60 % (05/15 0800)  General: WD obese Caucasian man lying in ICU bed. He is intubated. He is on a fentanyl drip. He does not open his eyes with verbal or tactile stimulation. He does not follow any commands.  HEENT: Neck is supple without lymphadenopathy. ETT and OGT in place. Sclerae are anicteric. There is no conjunctival injection.  CV: Regular, distant, no obvious murmur. Carotid pulses are 2+ and symmetric with no bruits. Distal pulses 2+ and symmetric.  Lungs: Coarse breath sounds bilaterally. Ventilated.  Neuro: MS: As noted above.  CN: Pupils are equal at about 1.5 mm, difficult to assess reactivity. He does not point to visual threat. Eyes are mildly disconjugate. Oculocephalics are intact. Corneals are present. His face appears to be grossly symmetric. This is partly obscured by tubes and tape. The remainder of cranial nerve examination is limited by the fact that he is presently intubated and sedated and therefore unable to participate.  Motor: Normal bulk, tone. No spontaneous movement is observed. He is unable to participate with confrontational strength testing. No tremor or other abnormal movements are observed.  Sensation: He withdraws from nail bed pressure 4.  DTRs: 2+, symmetric. Toes are downgoing bilaterally. No pathological reflexes.  Coordination/gait: These cannot be assessed as the patient is unable to participate with the examination.   Labs: Lab Results  Component Value Date   WBC 16.2 (H) 03/09/2017   HGB 12.2 (L) 03/09/2017   HCT 35.7 (L) 03/09/2017   PLT PENDING 03/09/2017   GLUCOSE 70 03/09/2017   CHOL 145 01/04/2017   TRIG 125.0 01/04/2017   HDL 49.90 01/04/2017   LDLCALC 70 01/04/2017   ALT 82 (H) 03/08/2017   AST 67 (H) 03/08/2017   NA 137 03/09/2017   K 4.8 03/09/2017   CL 109 03/09/2017    CREATININE 6.72 (H) 03/09/2017   BUN 67 (H) 03/09/2017   CO2 13 (L) 03/09/2017   TSH <0.010 (L) 03/07/2017   PSA 0.30 06/28/2014   INR 1.16 03/05/2017   CBC Latest Ref Rng & Units 03/09/2017 03/08/2017 03/07/2017  WBC 4.0 - 10.5 K/uL 16.2(H) 15.2(H) 14.1(H)  Hemoglobin 13.0 - 17.0 g/dL 12.2(L) 11.7(L) 11.9(L)  Hematocrit 39.0 - 52.0 % 35.7(L) 33.7(L) 35.7(L)  Platelets 150 - 400 K/uL PENDING 133(L) 122(L)    No results found for: HGBA1C Lab Results  Component Value Date   ALT 82 (H) 03/08/2017   AST 67 (H) 03/08/2017   ALKPHOS 119 03/08/2017   BILITOT 0.8 03/08/2017    Radiology:  There is no new neuroimaging.  Other diagnostic studies:  EEG today showed evidence of breach artifact in the right frontal region with moderate diffuse generalized slowing, no epileptiform abnormalities or seizures.  A/P:   1. Acute encephalopathy: This is most likely a toxic-metabolic process that is multifactorial in etiology with potential contributions from infection, hyponatremia, renal dysfunction, hyperammonemia, and hypoxia. He is now intubated due to worsening respiratory status and increased  hypoxemia. Based upon discussion with his sister, I suspect that he has some baseline cognitive impairment which places him at increased risk for encephalopathy and delirium from all causes. This would also predict delayed recovery from encephalopathy. Could consider MRI scan of the brain now that he is intubated and sedated to ensure no structural etiology for his encephalopathy. Continue to optimize metabolic status as you are. Continue to treat any underlying infection. Minimize the use of opiates, benzos or any medication with strong anticholinergic properties as much as possible. Optimize sleep-wake cycles as much as you can by keeping the room bright with activity during the day and dark and quiet at night. For agitation, recommend low-dose haloperidol or an atypical antipsychotic.   2. Lumbar epidural  abscess: Continue antibiotics. Will likely require rehabilitation services once his encephalopathy improves.  No family was present at the time of my visit today.    Melba Coon, MD [facility]

## 2017-03-09 NOTE — Progress Notes (Signed)
Pharmacy Antibiotic Note Lawrence Moore is a 66 y.o. male  s/p L4-5 discectomy on 02/24/17 with lumbar abscesss/p aspiration of right L4 laminectomy.  Pt now in ARF  vanc random this am 33  Pending susceptibilities of CONS in cx  Plan: -Cefriaxone 2g/24h -Continue to hold vanc -Vanc Random thurs am? -F/u susceptibilities   Height: 5\' 4"  (162.6 cm) Weight: 218 lb 4.8 oz (99 kg) IBW/kg (Calculated) : 59.2  Temp (24hrs), Avg:98.1 F (36.7 C), Min:97.3 F (36.3 C), Max:98.7 F (37.1 C)   Recent Labs Lab 03/03/2017 1656 03/13/2017 2041  03/06/17 0243 03/06/17 2033 03/06/17 2258 03/07/17 0048 03/07/17 0505 03/07/17 1631 03/08/17 0322 03/09/17 0628  WBC  --   --   < > 12.4*  --  16.1*  --  14.1*  --  15.2* 16.2*  CREATININE  --   --   < > 1.72* 3.52*  --   --  4.13* 4.95* 5.60* 6.72*  LATICACIDVEN 0.87 1.01  --   --  2.2*  --  1.7  --   --   --   --   VANCORANDOM  --   --   --   --   --   --   --  41  --   --  33  < > = values in this interval not displayed.  Estimated Creatinine Clearance: 11.8 mL/min (A) (by C-G formula based on SCr of 6.72 mg/dL (H)).    Allergies  Allergen Reactions  . Adhesive [Tape] Other (See Comments)    BANDAIDS > REDNESS PAPER TAPE (CAUSES REDNESS & IRRITATION): (03/05/2016)  . Erythromycin Swelling    SWELLING REACTION UNSPECIFIED   . Ibuprofen Other (See Comments)    ABDOMINAL PAIN CRAMPING  . Zocor [Simvastatin] Other (See Comments)    ELEVATED LFT's  . Hydrocodone Other (See Comments)    UNSPECIFIED REACTION    Levester Fresh, PharmD, BCPS, BCCCP Clinical Pharmacist Clinical phone for 03/09/2017 from 7a-3:30p: [phone] If after 3:30p, please call main pharmacy at: [phone] 03/09/2017 8:17 AM

## 2017-03-09 NOTE — Progress Notes (Signed)
eLink Physician-Brief Progress Note Patient Name: Lawrence Moore DOB: 10-19-1951 MRN: [id number]   Date of Service  03/09/2017  HPI/Events of Note  Patient with worsening respiratory distress this afternoon. Chest x-ray consistent with pulmonary edema. He got Lasix 80 mg IV earlier and made 100 mL urine.   Patient seen, in  respiratory distress. Moaning.  Blood pressure 160/76, heart rate 100, respiratory rate 20-28, O2 saturation 100% on nonrebreather mask.    eICU Interventions  Will try BiPAP.  Standby intubation.  We'll give another 80 mg IV Lasix.  May need Lasix drip.  Cont to monitor.      Intervention Category Major Interventions: Other:  [place] 03/09/2017, 12:04 AM

## 2017-03-09 NOTE — Progress Notes (Signed)
[name] Progress Note   Patient seen and examined- agree with note below.  Unfortunately required intubation last night due to pulmonary edema- had stopped IVF- given lasix and there does appear to be some response so will inc to max dose.  Also add some bicarb.  We do not have indications for dialysis but may be getting close.  I think candidate for short term HD but probably not long term.    Lawrence Moore A      Assessment/ Plan:   Acute Kidney Injury: Baseline Cr 1.1. Continuing to trend up, with Cr 6.72 today. Urine output 625m in past 24 hours which was an improvement, though still up +9.3L since admission. Urine output did increase some with Lasix IV [id number]- will increase to 160 q 8.. Weight not recorded so unable to assess weight change. Possible prerenal etiology (hypotension in the setting of sepsis and opioid overdose however he has been hypertensive in the last couple days), other contributing factors could be a contrasted study, supratherapeutic vanc level as well as NSAIDs. No acute indications for HD at this time but we may be getting close. - Continue holding IVF - Avoid nephrotoxic agents if possible- vancomycin has been discontinued - Strict I/Os- increasing lasix dose - Daily renal function panel   Pulmonary edema: In setting of acute renal failure. Worsening respiratory distress yesterday leading to intubation overnight. CXR consistent with pulmonary edema. IV Lasix 8055m2 did not improve symptoms. Likely secondary to volume overload. Underlying COPD and OSA possibly contributing as well.  - Extubate when able - Continue Lasix- increased dose  Non anion gap metabolic acidosis: Bicarb continues to drop, decreasing further to 13 today. May need bicarbonate repletion at some point. Start IV bicarb with dextrose at 50 per hour  Sepsis 2/2 to epidural abscess after right lumbar microdiscectomy: Previously on vanc which was stopped but level  likely still high (was 33). Procalcitonin high at 33.36 (05/14).  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 Previously elevated ammonia which resolved with lactulose. EEG with no obvious identifiable causes. - Holding prozac and cymbalta - PRN Haldol   HTN: Hypertensive up to 190500'Xstolic and low 100381'Wastolic since 5/12/99hough with periods of intermittent hypotension. BP very labile overnight, ranging from 188/79 to 84/41. Current volume overload status could be contributing to HTN, which will hopefully improve with discontinuation of IVF/lasix.  - PRN Labetolol  - Continue Lasix, though has been intermittently hypertensive overnight and this AM despite Lasix   History of DI- Holding home DDAVP due to oliguria. Continue to monitor sodium.    Subjective:   Patient intubated overnight due to increased work of breathing and hypoxemia. CXR consistent with pulmonary edema.    Objective:   BP 117/62   Pulse 72   Temp 97.8 F (36.6 C) (Oral)   Resp (!) 24   Ht _0  (1.626 m)   Wt 99 kg (218 lb 4.8 oz)   SpO2 100%   BMI 37.47 kg/m   Intake/Output Summary (Last 24 hours) at 03/09/17 0857 Last data filed at 03/09/17 0600  Gross per 24 hour  Intake           848.33 ml  Output              665 ml  Net           183.33 ml   Weight change:   Physical Exam: TKP:[id number], intubated CVS:RRR, no murmurs appreciated Resp:ETT in place, breaths even and unlabored EXN:TZGYF, soft, non-tender, non-distended Ext:No LE edema   Imaging: Dg Chest Port 1 View  Result Date: 03/09/2017 CLINICAL DATA:  Respiratory failure.  Intubation. EXAM: PORTABLE CHEST 1 VIEW COMPARISON:  Radiograph yesterday at 2156 hour FINDINGS: Endotracheal tube is 1.7 cm from the carina. Enteric tube in place, tip below the diaphragm  not included in the field of view. Progressive diffuse bilateral interstitial and alveolar airspace opacities from prior exam. Increased pleural effusions. Mediastinal contours are obscured. IMPRESSION: Endotracheal tube 1.7 cm from the carina. Tip of the enteric tube below the diaphragm in the stomach. Progressive bilateral diffuse airspace opacities suspicious for pulmonary edema. Increased pleural effusions. Electronically Signed   By: Jeb Levering M.D.   On: 03/09/2017 01:03   Dg Chest Port 1 View  Result Date: 03/08/2017 CLINICAL DATA:  Acute respiratory failure. EXAM: PORTABLE CHEST 1 VIEW COMPARISON:  Radiograph 03/08/2017 FINDINGS: Development of diffuse bilateral interstitial and alveolar opacities since prior exam. Slightly more confluent opacity persists at the right lung base. There is also a slightly more confluent component in the right upper lobe abutting the fissure. Probable bilateral pleural effusions. Heart is at the upper limits of normal in size, partially obscured. No pneumothorax. IMPRESSION: Development of diffuse bilateral interstitial and alveolar opacities suspicious for pulmonary edema, less likely atypical infection. Slightly more confluent opacities in the right lung base and right upper lobe abutting the fissure may be vascular, underlying pneumonia or aspiration. Suspect bilateral pleural effusions. Electronically Signed   By: Jeb Levering M.D.   On: 03/08/2017 22:13    Labs: BMET  Recent Labs Lab 03/05/17 0329 03/06/17 0243 03/06/17 2033 03/07/17 0505 03/07/17 1631 03/08/17 0322 03/09/17 0628  NA 133* 132* 131* 133* 133* 136 137  K 4.0 3.8 4.7 4.7 5.0 4.2 4.8  CL 97* 96* 97* 99* 103 105 109  CO2 30 27 20* 22 19* 18* 13*  GLUCOSE 99 83 108* 128* 108* 100* 70  BUN 10 15 23* 26* 36* 44* 67*  CREATININE 1.08 1.72* 3.52* 4.13* 4.95* 5.60* 6.72*  CALCIUM 8.2* 8.1* 7.9* 7.8* 7.4* 7.4* 8.1*  PHOS  --   --   --   --   --  5.5* 7.1*   CBC  Recent  Labs Lab 03/03/2017 1530  03/06/17 2258 03/07/17 0505 03/08/17 0322 03/09/17 0628  WBC 13.5*  < > 16.1* 14.1* 15.2* 16.2*  NEUTROABS 10.5*  --  15.1*  --  14.7*  --   HGB 13.8  < > 12.7* 11.9* 11.7* 12.2*  HCT 41.5  < > 37.3* 35.7* 33.7* 35.7*  MCV 89.4  < > 91.0 91.5 88.9 90.6  PLT 160  < > 134* 122* 133* PENDING  < > = values in this interval not displayed.  Medications:    . chlorhexidine  15 mL Mouth Rinse BID  . chlorhexidine gluconate (MEDLINE KIT)  15 mL Mouth Rinse BID  . fentaNYL (SUBLIMAZE) injection  50 mcg Intravenous Once  . heparin subcutaneous  5,000 Units Subcutaneous Q8H  . hydrocortisone sod succinate (SOLU-CORTEF) inj  25 mg Intravenous Q12H  . levothyroxine  75 mcg Intravenous Daily  . mouth rinse  15 mL Mouth Rinse q12n4p  . mouth rinse  15 mL Mouth Rinse QID  . thiamine injection  100 mg Intravenous Daily  Ruthann Cancer, MD PGY-2 03/09/2017, 8:57 AM

## 2017-03-09 NOTE — Progress Notes (Signed)
Initial Nutrition Assessment  DOCUMENTATION CODES:   Obesity unspecified  INTERVENTION:    Vital High Protein at 55 ml/h (1320 ml per day)   Provides 1320 kcal, 116 gm protein, 1104 ml free water daily  NUTRITION DIAGNOSIS:   Inadequate oral intake related to inability to eat as evidenced by NPO status.  GOAL:   Provide needs based on ASPEN/SCCM guidelines  MONITOR:   Vent status, TF tolerance, Labs, I & O's  REASON FOR ASSESSMENT:   Consult Enteral/tube feeding initiation and management  ASSESSMENT:   66 year old male admitted on 5/10 with epidural abscess. Required intubation on 5/15.   Received MD Consult for TF initiation and management. OGT in place. Nutrition-Focused physical exam completed. Findings are no fat depletion, no muscle depletion, and no edema.  Now in acute liver and renal failure.  Patient is currently intubated on ventilator support MV: 11.7 L/min Temp (24hrs), Avg:97.9 F (36.6 C), Min:97.3 F (36.3 C), Max:98.7 F (37.1 C)  Propofol: none  Labs reviewed: phosphorus 7.1 (H) Medications reviewed and include thiamine, Lasix, sodium bicarbonate.  Diet Order:  Diet NPO time specified  Skin:  Reviewed, no issues (back incisions)  Last BM:  5/15  Height:   Ht Readings from Last 1 Encounters:  03/14/2017 5\' 4"  (1.626 m)    Weight:   Wt Readings from Last 1 Encounters:  03/03/2017 218 lb 4.8 oz (99 kg)    Ideal Body Weight:  59.1 kg  BMI:  Body mass index is 37.47 kg/m.  Estimated Nutritional Needs:   Kcal:  5701-7793  Protein:  118 gm  Fluid:  1.5-1.8 L  EDUCATION NEEDS:   No education needs identified at this time  Molli Barrows, [place], [place], [place] Pager [phone] After Hours Pager [phone]

## 2017-03-10 ENCOUNTER — Inpatient Hospital Stay (HOSPITAL_COMMUNITY): Payer: Medicare Other

## 2017-03-10 DIAGNOSIS — G253 Myoclonus: Secondary | ICD-10-CM

## 2017-03-10 LAB — PHOSPHORUS
PHOSPHORUS: 6.4 mg/dL — AB (ref 2.5–4.6)
Phosphorus: 7.4 mg/dL — ABNORMAL HIGH (ref 2.5–4.6)

## 2017-03-10 LAB — GLUCOSE, CAPILLARY
GLUCOSE-CAPILLARY: 108 mg/dL — AB (ref 65–99)
GLUCOSE-CAPILLARY: 119 mg/dL — AB (ref 65–99)
GLUCOSE-CAPILLARY: 91 mg/dL (ref 65–99)
Glucose-Capillary: 120 mg/dL — ABNORMAL HIGH (ref 65–99)
Glucose-Capillary: 107 mg/dL — ABNORMAL HIGH (ref 65–99)
Glucose-Capillary: 115 mg/dL — ABNORMAL HIGH (ref 65–99)
Glucose-Capillary: 121 mg/dL — ABNORMAL HIGH (ref 65–99)

## 2017-03-10 LAB — AEROBIC/ANAEROBIC CULTURE W GRAM STAIN (SURGICAL/DEEP WOUND)

## 2017-03-10 LAB — BASIC METABOLIC PANEL
ANION GAP: 13 (ref 5–15)
BUN: 84 mg/dL — ABNORMAL HIGH (ref 6–20)
CHLORIDE: 105 mmol/L (ref 101–111)
CO2: 21 mmol/L — AB (ref 22–32)
Calcium: 8.4 mg/dL — ABNORMAL LOW (ref 8.9–10.3)
Creatinine, Ser: 7.14 mg/dL — ABNORMAL HIGH (ref 0.61–1.24)
GFR calc non Af Amer: 7 mL/min — ABNORMAL LOW (ref >60)
GFR, EST AFRICAN AMERICAN: 8 mL/min — AB (ref >60)
GLUCOSE: 121 mg/dL — AB (ref 65–99)
POTASSIUM: 4.2 mmol/L (ref 3.5–5.1)
Sodium: 139 mmol/L (ref 135–145)

## 2017-03-10 LAB — BLOOD GAS, ARTERIAL
Acid-base deficit: 3.7 mmol/L — ABNORMAL HIGH (ref 0.0–2.0)
Bicarbonate: 22.2 mmol/L (ref 20.0–28.0)
DRAWN BY: 12971
FIO2: 50
MECHVT: 480 mL
O2 SAT: 93.7 %
PEEP/CPAP: 5 cmH2O
PH ART: 7.267 — AB (ref 7.350–7.450)
PO2 ART: 74.3 mmHg — AB (ref 83.0–108.0)
Patient temperature: 98.6
RATE: 24 resp/min
pCO2 arterial: 50.4 mmHg — ABNORMAL HIGH (ref 32.0–48.0)

## 2017-03-10 LAB — PLATELET COUNT: Platelets: 123 10*3/uL — ABNORMAL LOW (ref 150–400)

## 2017-03-10 LAB — AMMONIA: AMMONIA: 40 umol/L — AB (ref 9–35)

## 2017-03-10 LAB — CBC
HEMATOCRIT: 31.7 % — AB (ref 39.0–52.0)
HEMOGLOBIN: 10.5 g/dL — AB (ref 13.0–17.0)
MCH: 29.8 pg (ref 26.0–34.0)
MCHC: 33.1 g/dL (ref 30.0–36.0)
MCV: 90.1 fL (ref 78.0–100.0)
Platelets: 121 10*3/uL — ABNORMAL LOW (ref 150–400)
RBC: 3.52 MIL/uL — AB (ref 4.22–5.81)
RDW: 14.6 % (ref 11.5–15.5)
WBC: 12.4 10*3/uL — ABNORMAL HIGH (ref 4.0–10.5)

## 2017-03-10 LAB — MAGNESIUM
MAGNESIUM: 2.3 mg/dL (ref 1.7–2.4)
Magnesium: 2.2 mg/dL (ref 1.7–2.4)

## 2017-03-10 LAB — AEROBIC/ANAEROBIC CULTURE (SURGICAL/DEEP WOUND)

## 2017-03-10 LAB — APTT: aPTT: 34 seconds (ref 24–36)

## 2017-03-10 LAB — HEMOGLOBIN AND HEMATOCRIT, BLOOD
HCT: 30.3 % — ABNORMAL LOW (ref 39.0–52.0)
Hemoglobin: 10.3 g/dL — ABNORMAL LOW (ref 13.0–17.0)

## 2017-03-10 LAB — FIBRINOGEN

## 2017-03-10 LAB — PROTIME-INR
INR: 1.2
Prothrombin Time: 15.3 seconds — ABNORMAL HIGH (ref 11.4–15.2)

## 2017-03-10 MED ORDER — FENTANYL CITRATE (PF) 2500 MCG/50ML IJ SOLN
25.0000 ug/h | Status: DC
  Administered 2017-03-10: 100 ug/h via INTRAVENOUS
  Administered 2017-03-11: 250 ug/h via INTRAVENOUS
  Administered 2017-03-11 – 2017-03-13 (×4): 400 ug/h via INTRAVENOUS
  Administered 2017-03-13: 350 ug/h via INTRAVENOUS
  Administered 2017-03-14: 300 ug/h via INTRAVENOUS
  Administered 2017-03-15: 400 ug/h via INTRAVENOUS
  Filled 2017-03-10 (×10): qty 100

## 2017-03-10 NOTE — Progress Notes (Signed)
Wasted 176mL fentanyl in sink with Leanord Hawking RN.

## 2017-03-10 NOTE — Progress Notes (Signed)
PULMONARY / CRITICAL CARE MEDICINE   Name: Lawrence Moore MRN: [id number] DOB: 14-Jan-1951    ADMISSION DATE:  03/01/2017 CONSULTATION DATE:  03/08/2017  REFERRING MD:  Dr. Kendrick Fries   CHIEF COMPLAINT:  Spinal abscess post right lumbar microdiscectomy  Brief:   66 year old male with PMH of anxiety, asthma, depression, DM, GERD, Hyperlipidemia, hypothyroidism, COPD  Admitted 5/10 with a spinal epidural abscess after a routine lumbar laminectomy for a disc rupture one week prior. MRI revealed new L4-L5 abscess. Underwent IR aspiration on 5/11. Pain had been hard to control and patient was requiring multiple PRN doses of dilaudid. Later in the day on 5/11 patient was found unresponsive with oxygen saturation in the 40s. He was given Narcan and responded well. However, later in the day patient was found aphasic and altered, again responsive to narcan, Neurology consulted, Head CT negative. Patient transferred to ICU and placed on BiPAP, ABG 7.2/50/82. Patient with liver and renal failure with elevated ammonia level. Nephrology consulted. Overnight 5/14 patient SOB requiring lasix administration and ultimately intubation.   SUBJECTIVE:  Remains intubated on fentanyl gtt. Mild response to lasix but +I/O  VITAL SIGNS: BP (!) 119/53   Pulse 74   Temp 98 F (36.7 C) (Oral)   Resp (!) 24   Ht 5\' 4"  (1.626 m)   Wt 218 lb 4.1 oz (99 kg)   SpO2 94%   BMI 37.46 kg/m   HEMODYNAMICS:    VENTILATOR SETTINGS: Vent Mode: PRVC FiO2 (%):  [50 %-60 %] 50 % Set Rate:  [24 bmp] 24 bmp Vt Set:  [480 mL] 480 mL PEEP:  [5 cmH20] 5 cmH20 Plateau Pressure:  [27 cmH20-31 cmH20] 31 cmH20  INTAKE / OUTPUT: I/O last 3 completed shifts: In: 2615.3 [I.V.:1416; Other:140; NG/GT:811.3; IV Piggyback:248] Out: 1865 [Urine:1865]  PHYSICAL EXAMINATION: General:  Obese wm sedated on vent HEENT: MM pink/moist , no neck TMH:DQQIWLN Neuro: Sedated CV: s1s2 rrr, no m/r/g PULM: decreased bs bases Vent: 50%/5  pep-> 95% sat           RR24/0 Vt 480-> PIP 28, plt 25 LG:XQJJ, non-tender, bsx4 active , TF at goal Extremities: warm/dry,++ edema  Skin: no rashes or lesions, wdi     LABS:  BMET  Recent Labs Lab 03/08/17 0322 03/09/17 0628 03/10/17 0433  NA 136 137 139  K 4.2 4.8 4.2  CL 105 109 105  CO2 18* 13* 21*  BUN 44* 67* 84*  CREATININE 5.60* 6.72* 7.14*  GLUCOSE 100* 70 121*    Electrolytes  Recent Labs Lab 03/08/17 0322 03/09/17 0628 03/09/17 1252 03/09/17 1824 03/10/17 0433  CALCIUM 7.4* 8.1*  --   --  8.4*  MG 1.6*  --  2.1 2.1 2.3  PHOS 5.5* 7.1* 7.4* 7.6* 7.4*    CBC  Recent Labs Lab 03/08/17 0322 03/09/17 0628 03/10/17 0433  WBC 15.2* 16.2* 12.4*  HGB 11.7* 12.2* 10.5*  HCT 33.7* 35.7* 31.7*  PLT 133* 118* 121*    Coag's  Recent Labs Lab 03/05/17 0641  APTT 32  INR 1.16    Sepsis Markers  Recent Labs Lab 03/11/2017 2041 03/06/17 2033 03/07/17 0048 03/07/17 1031 03/08/17 0322 03/09/17 0628  LATICACIDVEN 1.01 2.2* 1.7  --   --   --   PROCALCITON  --   --   --  38.31 33.36 20.21    ABG  Recent Labs Lab 03/07/17 0135 03/09/17 0118 03/09/17 1240  PHART 7.197* 7.145* 7.219*  PCO2ART 57.7* 53.9* 45.9  PO2ART 87.1 153* 118*    Liver Enzymes  Recent Labs Lab 03/06/17 2033 03/07/17 0505 03/08/17 0322 03/09/17 0628  AST 102* 82* 67*  --   ALT 120* 108* 82*  --   ALKPHOS 106 112 119  --   BILITOT 2.1* 1.1 0.8  --   ALBUMIN 2.6* 2.6* 2.6* 2.4*    Cardiac Enzymes No results for input(s): TROPONINI, PROBNP in the last 168 hours.  Glucose  Recent Labs Lab 03/09/17 1108 03/09/17 1617 03/09/17 2046 03/10/17 0028 03/10/17 0430 03/10/17 0820  GLUCAP 89 96 109* 91 120* 107*    Imaging Dg Chest Port 1 View  Result Date: 03/10/2017 CLINICAL DATA:  Ventilator dependent respiratory failure, acute and chronic. History of COPD, CHF, former smoker. EXAM: PORTABLE CHEST 1 VIEW COMPARISON:  Chest x-ray of Mar 09, 2017.  FINDINGS: The lungs are reasonably well inflated. Fluffy alveolar opacities persist bilaterally but are slightly less conspicuous today. The cardiac silhouette remains enlarged. The pulmonary vascularity remains engorged but is also less conspicuous. The endotracheal tube tip lies 2.3 cm above the carina. The esophagogastric tube tip projects below the inferior margin of the image. There is calcification in the wall of the aortic arch. The observed bony thorax is unremarkable. IMPRESSION: CHF with pulmonary interstitial and alveolar edema slightly improved over yesterday's study. The support tubes are in reasonable position. Thoracic aortic atherosclerosis. Electronically Signed   By: David  Martinique M.D.   On: 03/10/2017 07:22     STUDIES:  MRI L-SPINE W/ & W/O 5/10: IMPRESSION: 1. Status post right L4 laminectomy with fluid collection in the operative bed causing mass effect on the dorsal aspect of the thecal sac with worsened narrowing of thecal sac compared to the preoperative study. 2. Unchanged distortion of the right lateral recess at L5-S1 secondary to the presence of granulation tissue. PORT CXR 5/11:  Personally reviewed by me. Patchy opacification within right mid and lower lung zone. Full left hilum. No pleural effusion appreciated. CT HEAD/ANGIO NECK/HEAD 5/12: IMPRESSION: 1. Negative for emergent large vessel occlusion. 2. Diminutive intracranial vessels and moderate distal basilar stenosis. 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 3. High-grade stenosis at the origin of the non dominant right vertebral artery. 4. No carotid stenosis in the neck. COMPLETE ABD U/S 5/13:  Surgical absence of the gallbladder. No bile duct dilatation. Severe fatty infiltration of the liver. Minimal perinephric fluid noted along the lower pole of the left kidney, nonspecific. EEG 5/14 > Moderate diffuse slowing of the background, focal slowing over the right frontal region    CULTURES: MRSA PCR 4/30:  Negative  Blood Cultures x2 5/10 >>>neg Urine Culture 5/10:  Negative  HIV 5/11:  Nonreactive  L-4 Laminectomy Bed Culture 5/11 >>> Blood Cultures x2 5/12 >>> Stool C diff PCR 5/12:  Negative   ANTIBIOTICS: Rocephin 5/11 >>>5/15 Vancomycin 5/11 >>>  SIGNIFICANT EVENTS: 5/2 > L4-5 microdiscectomy  5/10> Admitted  5/11 > IR aspiration, respiratory distress responded to narcan, another event later in day did not respond, code stroke called > negative  5/12 > Solu-Cortef 100 q8h (Patient on Prednisone 5 mg outpatient)  5/13 > Solu-Cortef decreased to basal dosing  5/15 > Intubated  5/16 No HD today per renal  LINES/TUBES: ETT 5/15 >>   DISCUSSION: 66 year old male with epidural abscess now in acute liver and renal failure. On abx per ID. Renal following and will most likely need HD.  ASSESSMENT / PLAN:  PULMONARY A: Acute on Chronic  Hypercarbic respiratory Failure in sitting of pulmonary edema with refractory volume overload H/O COPD  P:   Vent bundle, doubt he can wean till volume statu corrected Daily wua/sbt Daily c x r while on vent  CARDIOVASCULAR A:  HTN P:  Cardiac Monitoring  PRN labetalol for SBP > 170  RENAL Lab Results  Component Value Date   CREATININE 7.14 (H) 03/10/2017   CREATININE 6.72 (H) 03/09/2017   CREATININE 5.60 (H) 03/08/2017    Recent Labs Lab 03/08/17 0322 03/09/17 0628 03/10/17 0433  K 4.2 4.8 4.2    Intake/Output Summary (Last 24 hours) at 03/10/17 0945 Last data filed at 03/10/17 0800  Gross per 24 hour  Intake          2509.42 ml  Output             1345 ml  Net          1164.42 ml      A:   Acute renal failure (Bas Cr 1.1) Non anion gap metabolic acidosis  +Volume Overload  P:   Nephrology following > may need HD  Trend BMP Avoid Nephrotoxic medications  Lasix drip per renal Bicarb with dextrose at 50 ml/hr  He may need short term HD for volume overload in near  future  GASTROINTESTINAL A:   Hepatic Failure  Elevated Ammonia  P:   Trend ammonia > 26 on 5/13 Trend LFT improved 5/16  HEMATOLOGIC  A:   Thrombocytopenia 118->121 P:  Trend CBC   INFECTIOUS A:   Epidural Abscess s/p discectomy on 5/2, IR on 5/11 for aspiration -Procal 33.36 on 5/14  -> 20.21 5/15 P:   Trend WBC and Fever Curve ID following  5/15  Rocephin dc per Posey Pronto Remains on Vanc but on hold for high levels due to renal failure/  ENDOCRINE CBG (last 3)   Recent Labs  03/10/17 0028 03/10/17 0430 03/10/17 0820  GLUCAP 91 120* 107*     A:   Hypopituitarism  Hypothyroid    P:   Continue Synthroid  Continue Solu-Cortef    NEUROLOGIC A:   Metabolic Encephalopathy in setting of Uremia and elevated ammonia  -EEG in process P:   RASS goal: 0/-1  Monitor  Neurology Following  Hold outpatient Prozac and Cymbalta  Wean fentanyl gtt for RASS goal  Haldol PRN    FAMILY  - Updates: Family updated at bedside   - Inter-disciplinary family meet or Palliative Care meeting due by: 03/13/2017    App CCT 30 min  Richardson Landry Minor ACNP Maryanna Shape PCCM Pager [phone] till 3 pm If no answer page [phone] 03/10/2017, 9:14 AM   STAFF NOTE: I, Merrie Roof, MD FACP have personally reviewed patient's available data, including medical history, events of note, physical examination and test results as part of my evaluation. I have discussed with resident/NP and other care providers such as pharmacist, RN and RRT. In addition, I personally evaluated patient and elicited key findings of: rass 2 to -2, not fc, coarse BS, last ABG with acidosis uncompensated, will increase rate 30 , repeat abg, SBT cancel, he did have some odd autocycliing which may need to change vent circuit and vent, but not a candidate now for sbt, await repeat abg, did pee 1.3 liter s but pos 1 liter last 24 hours, looking more like he will need HD, pct down indicating likley IR drainage was  successful, maintain vanc, levels high and following, concentrate all intake, fent with WUA needed, eeg  done, may need repeat imaging and back and head, would prefer to treat renal failure better prior to this, pcxr about unchanged coluem status, I updated family member at bedside, get coags for likley HD cath needs The patient is critically ill with multiple organ systems failure and requires high complexity decision making for assessment and support, frequent evaluation and titration of therapies, application of advanced monitoring technologies and extensive interpretation of multiple databases.   Critical Care Time devoted to patient care services described in this note is 35 Minutes. This time reflects time of care of this signee: Merrie Roof, MD FACP. This critical care time does not reflect procedure time, or teaching time or supervisory time of PA/NP/Med student/Med Resident etc but could involve care discussion time. Rest per NP/medical resident whose note is outlined above and that I agree with   [name]. Titus Mould, MD, [place] Pgr: [facility] 03/10/2017 11:06 AM

## 2017-03-10 NOTE — Progress Notes (Signed)
Neurology Progress Note  Subjective: No major overnight events reported. He remains intubated on a fentanyl drip. He is unable to participate with ROS as a result.    Medications reviewed and reconciled.   Pertinent meds: Fentanyl drip, currently 175 mcg/hr Sodium bicarbonate drip Hydrocortisone 25 mg every 12 hours Thiamine 100 mg daily  Current Meds:   Current Facility-Administered Medications:  .  acetaminophen (TYLENOL) tablet 650 mg, 650 mg, Oral, Q6H PRN **OR** acetaminophen (TYLENOL) suppository 650 mg, 650 mg, Rectal, Q6H PRN, Danford, Christopher P, MD .  albuterol (PROVENTIL) (2.5 MG/3ML) 0.083% nebulizer solution 2.5 mg, 2.5 mg, Nebulization, Q2H PRN, Chesley Mires, MD, 2.5 mg at 03/08/17 2011 .  chlorhexidine gluconate (MEDLINE KIT) (PERIDEX) 0.12 % solution 15 mL, 15 mL, Mouth Rinse, BID, Ashok Cordia, Sonia Baller, MD, 15 mL at 03/09/17 1937 .  famotidine (PEPCID) IVPB 20 mg premix, 20 mg, Intravenous, Daily, Juanito Doom, MD, Stopped at 03/09/17 [phone] .  feeding supplement (VITAL HIGH PROTEIN) liquid 1,000 mL, 1,000 mL, Per Tube, Continuous, Raylene Miyamoto, MD, Last Rate: 55 mL/hr at 03/09/17 1615, 1,000 mL at 03/09/17 1615 .  fentaNYL (SUBLIMAZE) bolus via infusion 50 mcg, 50 mcg, Intravenous, Q1H PRN, [name] B, MD .  fentaNYL (SUBLIMAZE) injection 50 mcg, 50 mcg, Intravenous, Once, McQuaid, Douglas B, MD .  fentaNYL 2525mg in NS 2545m(1074mml) infusion-PREMIX, 25-400 mcg/hr, Intravenous, Continuous, McQuaid, Douglas B, MD, Last Rate: 17.5 mL/hr at 03/10/17 0700, 175 mcg/hr at 03/10/17 0700 .  furosemide (LASIX) 160 mg in dextrose 5 % 50 mL IVPB, 160 mg, Intravenous, Q8H, GolCorliss ParishD, Stopped at 03/10/17 0700 .  haloperidol lactate (HALDOL) injection 2 mg, 2 mg, Intravenous, Q6H PRN, PanSharia ReeveD, 2 mg at 03/08/17 0145 .  heparin injection 5,000 Units, 5,000 Units, Subcutaneous, Q8H, Masters, AliJake ChurchPH, 5,000 Units at 03/10/17 0600 .   hydrALAZINE (APRESOLINE) injection 10-40 mg, 10-40 mg, Intravenous, Q4H PRN, McQuaid, Douglas B, MD, 20 mg at 03/08/17 2256 .  hydrocortisone sodium succinate (SOLU-CORTEF) 100 MG injection 25 mg, 25 mg, Intravenous, Q12H, NesJavier GlazierD, 25 mg at 03/09/17 2331 .  labetalol (NORMODYNE,TRANDATE) injection 10 mg, 10 mg, Intravenous, Q2H PRN, SooChesley MiresD, 10 mg at 03/08/17 2113 .  levothyroxine (SYNTHROID, LEVOTHROID) injection 75 mcg, 75 mcg, Intravenous, Daily, GidGuy BeginD, 75 mcg at 03/09/17 0910 .  MEDLINE mouth rinse, 15 mL, Mouth Rinse, QID, NesJavier GlazierD, 15 mL at 03/10/17 0438 .  midazolam (VERSED) injection 2 mg, 2 mg, Intravenous, Q15 min PRN, McQSimonne Maffucci MD .  midazolam (VERSED) injection 2 mg, 2 mg, Intravenous, Q2H PRN, McQSimonne Maffucci MD, 2 mg at 03/09/17 0345 .  morphine 2 MG/ML injection 2-4 mg, 2-4 mg, Intravenous, Q2H PRN, ByrCollene GobbleD .  naloxone (NAFil[facility] Panjection 0.4 mg, 0.4 mg, Intravenous, PRN, Rai, Ripudeep K, MD, 0.4 mg at 03/05/17 2218 .  ondansetron (ZOFRAN) tablet 4 mg, 4 mg, Oral, Q6H PRN **OR** ondansetron (ZOFRAN) injection 4 mg, 4 mg, Intravenous, Q6H PRN, Danford, ChrSuann LarryD, 4 mg at 03/06/17 2216 .  sodium bicarbonate 150 mEq in dextrose 5 % 1,000 mL infusion, , Intravenous, Continuous, GolCorliss ParishD, Last Rate: 50 mL/hr at 03/10/17 0651 .  thiamine (B-1) injection 100 mg, 100 mg, Intravenous, Daily, NesJavier GlazierD, 100 mg at 03/09/17 0916  Objective:  Temp:  [97.3 F (36.3 C)-98.1 F (36.7 C)] 98.1 F (36.7 C) (05/16 0432) Pulse Rate:  [62-81]  74 (05/16 0700) Resp:  [21-25] 22 (05/16 0700) BP: (110-145)/(51-71) 127/56 (05/16 0700) SpO2:  [90 %-100 %] 96 % (05/16 0700) FiO2 (%):  [50 %-60 %] 50 % (05/16 0339) Weight:  [99 kg (218 lb 4.1 oz)] 99 kg (218 lb 4.1 oz) (05/16 0438)  General: WD obese Caucasian man lying in ICU bed. He is intubated. He is on a fentanyl drip. He does not open  his eyes with verbal or tactile stimulation. He does not follow any commands. He has occasional brief myoclonic jerks that are irregular and that involve face, arms, and legs in various combinations.  HEENT: Neck is supple without lymphadenopathy. ETT and OGT in place. Sclerae are anicteric. There is no conjunctival injection.  CV: Regular, distant, no obvious murmur. Carotid pulses are 2+ and symmetric with no bruits. Distal pulses 2+ and symmetric.  Lungs: CTAB on anterior auscultation bilaterally. Ventilated. Neuro: MS: As noted above.  CN: Pupils are equal at about 2 mm, sluggishly reactive with rebound. Eyes are mildly disconjugate. Oculocephalics are intact. Corneals are present. His face appears to be grossly symmetric at rest but is partly obscured by tubes and tape. He appears to have a symmetric grimace to pain. The remainder of cranial nerve examination is limited by the fact that he is presently intubated and sedated and therefore unable to participate.  Motor: Normal bulk, tone. He is unable to participate with confrontational strength testing. He has intermittent irregular brief myoclonic jerks involving the face, arms and legs in various combinations. These occur spontaneously and with stimulation. He also has some occasional fanning of his toes. No rhythmic movements are noted.  Sensation: He withdraws from nailbed pressure 4.  DTRs: 2+, symmetric. Toes are downgoing bilaterally. No pathological reflexes.  Coordination/gait: These cannot be assessed as the patient is unable to participate with the examination.   Labs: Lab Results  Component Value Date   WBC 12.4 (H) 03/10/2017   HGB 10.5 (L) 03/10/2017   HCT 31.7 (L) 03/10/2017   PLT 121 (L) 03/10/2017   GLUCOSE 121 (H) 03/10/2017   CHOL 145 01/04/2017   TRIG 125.0 01/04/2017   HDL 49.90 01/04/2017   LDLCALC 70 01/04/2017   ALT 82 (H) 03/08/2017   AST 67 (H) 03/08/2017   NA 139 03/10/2017   K 4.2 03/10/2017   CL 105  03/10/2017   CREATININE 7.14 (H) 03/10/2017   BUN 84 (H) 03/10/2017   CO2 21 (L) 03/10/2017   TSH <0.010 (L) 03/07/2017   PSA 0.30 06/28/2014   INR 1.16 03/05/2017   CBC Latest Ref Rng & Units 03/10/2017 03/09/2017 03/08/2017  WBC 4.0 - 10.5 K/uL 12.4(H) 16.2(H) 15.2(H)  Hemoglobin 13.0 - 17.0 g/dL 10.5(L) 12.2(L) 11.7(L)  Hematocrit 39.0 - 52.0 % 31.7(L) 35.7(L) 33.7(L)  Platelets 150 - 400 K/uL 121(L) 118(L) 133(L)    No results found for: HGBA1C Lab Results  Component Value Date   ALT 82 (H) 03/08/2017   AST 67 (H) 03/08/2017   ALKPHOS 119 03/08/2017   BILITOT 0.8 03/08/2017   Mg 2.3 Phos 7.4  Radiology:  There is no new neuroimaging.  Other diagnostic studies:  EEG today showed evidence of breach artifact in the right frontal region with moderate diffuse generalized slowing, no epileptiform abnormalities or seizures.  A/P:   1. Acute encephalopathy: This is most likely a toxic-metabolic process that is multifactorial in etiology with potential contributions from infection, hyponatremia, renal dysfunction, hyperammonemia, and hypoxia. His renal function continues to decline with worsening uremia.  He appears to have some baseline cognitive impairment which places him at increased risk for encephalopathy and delirium from all causes. This would also predict delayed recovery from encephalopathy. Could consider MRI scan of the brain now that he is intubated and sedated to ensure no structural etiology for his encephalopathy. Continue to optimize metabolic status as you are. Continue to treat any underlying infection. Minimize the use of opiates, benzos or any medication with strong anticholinergic properties as much as possible. Optimize sleep-wake cycles as much as you can by keeping the room bright with activity during the day and dark and quiet at night. For agitation, recommend low-dose haloperidol or an atypical antipsychotic.   2. Myoclonus: This is new today. This is likely due  to his uremia for the most part though fentanyl could also be contributing. This does not appear consistent with seizure. However, he also has some unusual twitching and fanning of his toes. While this is non-rhythmic, I cannot fully exclude subclinical seizure activity so I will repeat EEG today.   3. Lumbar epidural abscess: Continue antibiotics. Will likely require rehabilitation services once his encephalopathy improves.  No family was present at the time of my visit today.    Melba Coon, MD [facility]

## 2017-03-10 NOTE — Progress Notes (Signed)
Bedside EEG completed, results pending. 

## 2017-03-10 NOTE — Progress Notes (Signed)
Attempted to bathe patient but family member requested bath be postponed until after additional family members arrived. Will attempt to bathe patient later in shift once visitors have left.

## 2017-03-10 NOTE — Progress Notes (Signed)
Subjective:  Did respond to lasix with 1300 of UOP- but did get 2400 in :(  Kidney numbers worsening- hemodynamically stable  Objective Vital signs in last 24 hours: Vitals:   03/10/17 0438 03/10/17 0500 03/10/17 0600 03/10/17 0700  BP:  136/63 123/60 (!) 127/56  Pulse:  75 71 74  Resp:  (!) 24 (!) 24 (!) 22  Temp:      TempSrc:      SpO2:  90% 95% 96%  Weight: 99 kg (218 lb 4.1 oz)     Height:       Weight change:   Intake/Output Summary (Last 24 hours) at 03/10/17 0756 Last data filed at 03/10/17 0749  Gross per 24 hour  Intake          2446.92 ml  Output             1485 ml  Net           961.92 ml    Assessment/Plan  66 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 He's also has developed acute kidney injury in the setting of hypotension/contrasted study/NSAIDs/supratherapeuticvanc level  1. Renal- baseline creatinine good at 1.1. Starting on 5/12- began to develop AKI thought to be due to hypotension/ATN/contrasted study/NSAIDS and supratherapuetic vanc level, and numbers have worsened daily.  Previously oliguric but now making better urine with the assistance of lasix.  There are no absolute indications for dialysis at this time but we are getting close.  Hopeful for eventual recovery given baseline creatinine it is just a matter of whether he will need HD before that happens.  He would be short term HD candidate but I do not think would be long term HD candidate due to cognitive deficits described by the sister  2. HTN/vol- is overloaded- Fio2 50% on vent.  However is responding some to lasix, to continue for now with hopes of improvement still 3. Anemia- not a significant issue at this time 4. Metabolic acidosis- improved some on low dose IV bicarb 5. Epidural abscess- on vanc- last level 33 6. Pan hypopit- on steroids and synthroid- also history of DI but since UOP is not heavy does not need  treatment for that at this time 7. VDRF- mostly due to volume but also some MS- per CCM   Lindsey Demonte A    Labs: Basic Metabolic Panel:  Recent Labs Lab 03/08/17 0322 03/09/17 0628 03/09/17 1252 03/09/17 1824 03/10/17 0433  NA 136 137  --   --  139  K 4.2 4.8  --   --  4.2  CL 105 109  --   --  105  CO2 18* 13*  --   --  21*  GLUCOSE 100* 70  --   --  121*  BUN 44* 67*  --   --  84*  CREATININE 5.60* 6.72*  --   --  7.14*  CALCIUM 7.4* 8.1*  --   --  8.4*  PHOS 5.5* 7.1* 7.4* 7.6* 7.4*   Liver Function Tests:  Recent Labs Lab 03/06/17 2033 03/07/17 0505 03/08/17 0322 03/09/17 0628  AST 102* 82* 67*  --   ALT 120* 108* 82*  --   ALKPHOS 106 112 119  --   BILITOT 2.1* 1.1 0.8  --   PROT 5.4* 5.4* 5.5*  --   ALBUMIN 2.6* 2.6* 2.6* 2.4*   No results for input(s): LIPASE, AMYLASE in the last 168 hours.  Recent Labs  Lab 03/06/17 2033 03/07/17 1031  AMMONIA 66* 26   CBC:  Recent Labs Lab 02/26/2017 1530  03/06/17 2258 03/07/17 0505 03/08/17 0322 03/09/17 0628 03/10/17 0433  WBC 13.5*  < > 16.1* 14.1* 15.2* 16.2* 12.4*  NEUTROABS 10.5*  --  15.1*  --  14.7*  --   --   HGB 13.8  < > 12.7* 11.9* 11.7* 12.2* 10.5*  HCT 41.5  < > 37.3* 35.7* 33.7* 35.7* 31.7*  MCV 89.4  < > 91.0 91.5 88.9 90.6 90.1  PLT 160  < > 134* 122* 133* 118* 121*  < > = values in this interval not displayed. Cardiac Enzymes: No results for input(s): CKTOTAL, CKMB, CKMBINDEX, TROPONINI in the last 168 hours. CBG:  Recent Labs Lab 03/09/17 1108 03/09/17 1617 03/09/17 2046 03/10/17 0028 03/10/17 0430  GLUCAP 89 96 109* 91 120*    Iron Studies: No results for input(s): IRON, TIBC, TRANSFERRIN, FERRITIN in the last 72 hours. Studies/Results: Dg Chest Port 1 View  Result Date: 03/10/2017 CLINICAL DATA:  Ventilator dependent respiratory failure, acute and chronic. History of COPD, CHF, former smoker. EXAM: PORTABLE CHEST 1 VIEW COMPARISON:  Chest x-ray of Mar 09, 2017.  FINDINGS: The lungs are reasonably well inflated. Fluffy alveolar opacities persist bilaterally but are slightly less conspicuous today. The cardiac silhouette remains enlarged. The pulmonary vascularity remains engorged but is also less conspicuous. The endotracheal tube tip lies 2.3 cm above the carina. The esophagogastric tube tip projects below the inferior margin of the image. There is calcification in the wall of the aortic arch. The observed bony thorax is unremarkable. IMPRESSION: CHF with pulmonary interstitial and alveolar edema slightly improved over yesterday's study. The support tubes are in reasonable position. Thoracic aortic atherosclerosis. Electronically Signed   By: David  Martinique M.D.   On: 03/10/2017 07:22   Dg Chest Port 1 View  Result Date: 03/09/2017 CLINICAL DATA:  Respiratory failure.  Intubation. EXAM: PORTABLE CHEST 1 VIEW COMPARISON:  Radiograph yesterday at 2156 hour FINDINGS: Endotracheal tube is 1.7 cm from the carina. Enteric tube in place, tip below the diaphragm not included in the field of view. Progressive diffuse bilateral interstitial and alveolar airspace opacities from prior exam. Increased pleural effusions. Mediastinal contours are obscured. IMPRESSION: Endotracheal tube 1.7 cm from the carina. Tip of the enteric tube below the diaphragm in the stomach. Progressive bilateral diffuse airspace opacities suspicious for pulmonary edema. Increased pleural effusions. Electronically Signed   By: Jeb Levering M.D.   On: 03/09/2017 01:03   Dg Chest Port 1 View  Result Date: 03/08/2017 CLINICAL DATA:  Acute respiratory failure. EXAM: PORTABLE CHEST 1 VIEW COMPARISON:  Radiograph 03/08/2017 FINDINGS: Development of diffuse bilateral interstitial and alveolar opacities since prior exam. Slightly more confluent opacity persists at the right lung base. There is also a slightly more confluent component in the right upper lobe abutting the fissure. Probable bilateral pleural  effusions. Heart is at the upper limits of normal in size, partially obscured. No pneumothorax. IMPRESSION: Development of diffuse bilateral interstitial and alveolar opacities suspicious for pulmonary edema, less likely atypical infection. Slightly more confluent opacities in the right lung base and right upper lobe abutting the fissure may be vascular, underlying pneumonia or aspiration. Suspect bilateral pleural effusions. Electronically Signed   By: Jeb Levering M.D.   On: 03/08/2017 22:13   Medications: Infusions: . famotidine (PEPCID) IV Stopped (03/09/17 0951)  . feeding supplement (VITAL HIGH PROTEIN) 1,000 mL (03/09/17 1615)  . fentaNYL infusion INTRAVENOUS 175  mcg/hr (03/10/17 0700)  . furosemide Stopped (03/10/17 0700)  .  sodium bicarbonate  infusion 1000 mL 50 mL/hr at 03/10/17 0651    Scheduled Medications: . chlorhexidine gluconate (MEDLINE KIT)  15 mL Mouth Rinse BID  . fentaNYL (SUBLIMAZE) injection  50 mcg Intravenous Once  . heparin subcutaneous  5,000 Units Subcutaneous Q8H  . hydrocortisone sod succinate (SOLU-CORTEF) inj  25 mg Intravenous Q12H  . levothyroxine  75 mcg Intravenous Daily  . mouth rinse  15 mL Mouth Rinse QID  . thiamine injection  100 mg Intravenous Daily    have reviewed scheduled and prn medications.  Physical Exam: General:sedated, intubated Heart:RRR Lungs: mostly clear Abdomen: distended , non tender Extremities: pitting edema    03/10/2017,7:56 AM  LOS: 6 days

## 2017-03-10 NOTE — Procedures (Signed)
ELECTROENCEPHALOGRAM REPORT  Date of Study: 03/10/2017  Patient's Name: Lawrence Moore MRN: [id number] Date of Birth: Sep 29, 1951  Referring Provider: Dr. Melba Coon  Clinical History: This is a 66 year old man with new onset myoclonus, twitching and fanning of toes.   Medications: fentaNYL 2522mg in NS 2544m(1042mml) infusion-PREMIX  acetaminophen (TYLENOL) tablet 650 mg  albuterol (PROVENTIL) (2.5 MG/3ML) 0.083% nebulizer solution 2.5 mg  chlorhexidine gluconate (MEDLINE KIT) (PERIDEX) 0.12 % solution 15 mL  famotidine (PEPCID) IVPB 20 mg premix  furosemide (LASIX) 160 mg in dextrose 5 % 50 mL IVPB  haloperidol lactate (HALDOL) injection 2 mg  hydrALAZINE (APRESOLINE) injection 10-40 mg  labetalol (NORMODYNE,TRANDATE) injection 10 mg  levothyroxine (SYNTHROID, LEVOTHROID) injection 75 mcg  morphine 2 MG/ML injection 2-4 mg  naloxone (NARCAN) injection 0.4 mg  thiamine (B-1) injection 100 mg   Technical Summary: A multichannel digital EEG recording measured by the international 10-20 system with electrodes applied with paste and impedances below 5000 ohms performed as portable with EKG monitoring in an intubated and sedated patient.  Hyperventilation and photic stimulation were not performed.  The digital EEG was referentially recorded, reformatted, and digitally filtered in a variety of bipolar and referential montages for optimal display.   Description: The patient is intubated and sedated on Fentanyl during the recording. There is no clear posterior dominant rhythm. The background consists of a large amount of diffuse 4-5 Hz theta and 2-3 Hz delta slowing, with additional focal slowing and higher amplitude sharply contoured activity over the right frontal region. Normal sleep architecture is not seen. Hyperventilation and photic stimulation were not performed.  There were no epileptiform discharges or electrographic seizures seen.    EKG lead was  unremarkable.  Impression: This sedated EEG is abnormal due to the presence of: 1. Moderate diffuse slowing of the background 2. Additional focal slowing over the right frontal region 3. Breach artifact over the right frontal region  Clinical Correlation of the above findings indicates diffuse cerebral dysfunction that is non-specific in etiology and can be seen with hypoxic/ischemic injury, toxic/metabolic encephalopathies, neurodegenerative disorders, or medication effect. Additional focal slowing over the right frontal region indicates focal cerebral dysfunction in this region suggestive of underlying structural or physiologic abnormality. Breach artifact is consistent with history of prior surgery.  There were no myoclonic jerks or twitching reported during the study. No subclinical seizures seen. Clinical correlation is advised.  KarEllouise Newer.D.

## 2017-03-10 NOTE — Progress Notes (Signed)
[facility] Progress Note Patient Name: Lawrence Moore DOB: 07/20/1951 MRN: [id number]   Date of Service  03/10/2017  HPI/Events of Note  Agitation - request to renew restraints.   eICU Interventions  Will renew restraint orders.      Intervention Category Minor Interventions: Agitation / anxiety - evaluation and management  [name] 03/10/2017, 10:48 PM

## 2017-03-11 ENCOUNTER — Ambulatory Visit (HOSPITAL_COMMUNITY): Payer: Self-pay | Admitting: Licensed Clinical Social Worker

## 2017-03-11 ENCOUNTER — Inpatient Hospital Stay (HOSPITAL_COMMUNITY): Payer: Medicare Other

## 2017-03-11 DIAGNOSIS — Z978 Presence of other specified devices: Secondary | ICD-10-CM

## 2017-03-11 DIAGNOSIS — R509 Fever, unspecified: Secondary | ICD-10-CM

## 2017-03-11 DIAGNOSIS — Z9911 Dependence on respirator [ventilator] status: Secondary | ICD-10-CM

## 2017-03-11 DIAGNOSIS — N179 Acute kidney failure, unspecified: Secondary | ICD-10-CM

## 2017-03-11 DIAGNOSIS — B957 Other staphylococcus as the cause of diseases classified elsewhere: Secondary | ICD-10-CM

## 2017-03-11 LAB — POCT ACTIVATED CLOTTING TIME
ACTIVATED CLOTTING TIME: 147 s
ACTIVATED CLOTTING TIME: 153 s
ACTIVATED CLOTTING TIME: 169 s
ACTIVATED CLOTTING TIME: 186 s
Activated Clotting Time: 153 seconds
Activated Clotting Time: 147 seconds
Activated Clotting Time: 175 seconds
Activated Clotting Time: 164 seconds

## 2017-03-11 LAB — GLUCOSE, CAPILLARY
GLUCOSE-CAPILLARY: 107 mg/dL — AB (ref 65–99)
GLUCOSE-CAPILLARY: 115 mg/dL — AB (ref 65–99)
GLUCOSE-CAPILLARY: 82 mg/dL (ref 65–99)
Glucose-Capillary: 120 mg/dL — ABNORMAL HIGH (ref 65–99)
Glucose-Capillary: 102 mg/dL — ABNORMAL HIGH (ref 65–99)
Glucose-Capillary: 74 mg/dL (ref 65–99)

## 2017-03-11 LAB — RENAL FUNCTION PANEL
ALBUMIN: 2 g/dL — AB (ref 3.5–5.0)
ALBUMIN: 1.8 g/dL — AB (ref 3.5–5.0)
ANION GAP: 15 (ref 5–15)
ANION GAP: 15 (ref 5–15)
BUN: 106 mg/dL — ABNORMAL HIGH (ref 6–20)
BUN: 112 mg/dL — ABNORMAL HIGH (ref 6–20)
CO2: 23 mmol/L (ref 22–32)
CO2: 25 mmol/L (ref 22–32)
Calcium: 8.3 mg/dL — ABNORMAL LOW (ref 8.9–10.3)
Calcium: 8.1 mg/dL — ABNORMAL LOW (ref 8.9–10.3)
Chloride: 101 mmol/L (ref 101–111)
Chloride: 101 mmol/L (ref 101–111)
Creatinine, Ser: 7.47 mg/dL — ABNORMAL HIGH (ref 0.61–1.24)
Creatinine, Ser: 6.65 mg/dL — ABNORMAL HIGH (ref 0.61–1.24)
GFR calc Af Amer: 9 mL/min — ABNORMAL LOW (ref >60)
GFR, EST AFRICAN AMERICAN: 8 mL/min — AB (ref >60)
GFR, EST NON AFRICAN AMERICAN: 7 mL/min — AB (ref >60)
GFR, EST NON AFRICAN AMERICAN: 8 mL/min — AB (ref >60)
GLUCOSE: 127 mg/dL — AB (ref 65–99)
Glucose, Bld: 119 mg/dL — ABNORMAL HIGH (ref 65–99)
PHOSPHORUS: 6.2 mg/dL — AB (ref 2.5–4.6)
PHOSPHORUS: 7.1 mg/dL — AB (ref 2.5–4.6)
POTASSIUM: 3.7 mmol/L (ref 3.5–5.1)
POTASSIUM: 3.7 mmol/L (ref 3.5–5.1)
Sodium: 139 mmol/L (ref 135–145)
Sodium: 141 mmol/L (ref 135–145)

## 2017-03-11 LAB — BLOOD GAS, ARTERIAL
Acid-Base Excess: 1.4 mmol/L (ref 0.0–2.0)
Bicarbonate: 25.8 mmol/L (ref 20.0–28.0)
Drawn by: 331001
FIO2: 100
O2 SAT: 99.7 %
PEEP: 5 cmH2O
PH ART: 7.387 (ref 7.350–7.450)
Patient temperature: 99.3
RATE: 35 resp/min
VT: 480 mL
pCO2 arterial: 44.2 mmHg (ref 32.0–48.0)
pO2, Arterial: 323 mmHg — ABNORMAL HIGH (ref 83.0–108.0)

## 2017-03-11 LAB — CBC
HCT: 30 % — ABNORMAL LOW (ref 39.0–52.0)
HEMOGLOBIN: 10.1 g/dL — AB (ref 13.0–17.0)
MCH: 29.8 pg (ref 26.0–34.0)
MCHC: 33.7 g/dL (ref 30.0–36.0)
MCV: 88.5 fL (ref 78.0–100.0)
Platelets: 151 10*3/uL (ref 150–400)
RBC: 3.39 MIL/uL — ABNORMAL LOW (ref 4.22–5.81)
RDW: 14.8 % (ref 11.5–15.5)
WBC: 10.8 10*3/uL — AB (ref 4.0–10.5)

## 2017-03-11 LAB — VANCOMYCIN, RANDOM: VANCOMYCIN RM: 31

## 2017-03-11 LAB — MAGNESIUM: MAGNESIUM: 2.1 mg/dL (ref 1.7–2.4)

## 2017-03-11 MED ORDER — HEPARIN (PORCINE) 2000 UNITS/L FOR CRRT
INTRAVENOUS_CENTRAL | Status: DC | PRN
  Filled 2017-03-11: qty 1000

## 2017-03-11 MED ORDER — LACTULOSE 10 GM/15ML PO SOLN
30.0000 g | Freq: Two times a day (BID) | ORAL | Status: DC
  Administered 2017-03-11 – 2017-03-14 (×8): 30 g
  Filled 2017-03-11 (×9): qty 45

## 2017-03-11 MED ORDER — PRISMASOL BGK 4/2.5 32-4-2.5 MEQ/L IV SOLN
INTRAVENOUS | Status: DC
  Administered 2017-03-11 – 2017-03-15 (×7): via INTRAVENOUS_CENTRAL
  Filled 2017-03-11 (×7): qty 5000

## 2017-03-11 MED ORDER — HEPARIN SODIUM (PORCINE) 1000 UNIT/ML DIALYSIS
1000.0000 [IU] | INTRAMUSCULAR | Status: DC | PRN
  Administered 2017-03-11: 2400 [IU] via INTRAVENOUS_CENTRAL
  Filled 2017-03-11 (×2): qty 6

## 2017-03-11 MED ORDER — PRISMASOL BGK 4/2.5 32-4-2.5 MEQ/L IV SOLN
INTRAVENOUS | Status: DC
  Administered 2017-03-11 – 2017-03-15 (×9): via INTRAVENOUS_CENTRAL
  Filled 2017-03-11 (×8): qty 5000

## 2017-03-11 MED ORDER — ETOMIDATE 2 MG/ML IV SOLN
15.0000 mg | Freq: Once | INTRAVENOUS | Status: AC
  Administered 2017-03-11: 15 mg via INTRAVENOUS

## 2017-03-11 MED ORDER — HEPARIN BOLUS VIA INFUSION (CRRT)
1000.0000 [IU] | INTRAVENOUS | Status: DC | PRN
  Administered 2017-03-11 (×2): 1000 [IU] via INTRAVENOUS_CENTRAL
  Filled 2017-03-11: qty 1000

## 2017-03-11 MED ORDER — PRISMASOL BGK 4/2.5 32-4-2.5 MEQ/L IV SOLN
INTRAVENOUS | Status: DC
  Administered 2017-03-11 – 2017-03-15 (×27): via INTRAVENOUS_CENTRAL
  Filled 2017-03-11 (×34): qty 5000

## 2017-03-11 MED ORDER — SODIUM CHLORIDE 0.9 % IJ SOLN
250.0000 [IU]/h | INTRAMUSCULAR | Status: DC
  Administered 2017-03-11: 250 [IU]/h via INTRAVENOUS_CENTRAL
  Administered 2017-03-12 – 2017-03-14 (×5): 1000 [IU]/h via INTRAVENOUS_CENTRAL
  Administered 2017-03-15: 800 [IU]/h via INTRAVENOUS_CENTRAL
  Filled 2017-03-11 (×12): qty 2

## 2017-03-11 MED ORDER — ETOMIDATE 2 MG/ML IV SOLN
INTRAVENOUS | Status: AC
  Filled 2017-03-11: qty 10

## 2017-03-11 NOTE — Progress Notes (Signed)
[facility] Progress Note Patient Name: Lawrence Moore DOB: May 19, 1951 MRN: [id number]   Date of Service  03/11/2017  HPI/Events of Note  Diarrhea - nurse requests Flexiseal.   eICU Interventions  Will order: 1. Place Flexiseal.      Intervention Category Intermediate Interventions: Other:  [name] 03/11/2017, 7:38 PM

## 2017-03-11 NOTE — Care Management Note (Signed)
Case Management Note Original Note Created by Jacqualin Combes  Patient Details  Name: SHAHIL SPEEGLE MRN: [id number] Date of Birth: 1951-09-10  Subjective/Objective:   Pt admitted with epidural abscess. He is from home with family.                  Action/Plan: Plan is for aspiration of abscess in IR. CM following for d/c needs, physician orders.   Expected Discharge Date:                  Expected Discharge Plan:     In-House Referral:     Discharge planning Services     Post Acute Care Choice:    Choice offered to:     DME Arranged:    DME Agency:     HH Arranged:    HH Agency:     Status of Service:  In process, will continue to follow  If discussed at Long Length of Stay Meetings, dates discussed:    Additional Comments: 03/11/2017 Pt remains intubated.  Started on CRRT today.  Per previous CM note pt is from home independent with sister  5/11 patient was found unresponsive with oxygen saturation in the 40s. He was given Narcan and responded well. However, later in the day patient was found aphasic and altered, again responsive to narcan, Neurology consulted, Head CT negative. Patient transferred to ICU   Maryclare Labrador, RN 03/11/2017, 3:02 PM

## 2017-03-11 NOTE — Progress Notes (Signed)
Pharmacy Antibiotic Note CASEY FYE is a 66 y.o. male  s/p L4-5 discectomy on 02/24/17 with lumbar abscesss/p aspiration of right L4 laminectomy - resulting organism is MRSE.  Pt now in ARF - to begin CRRT today  vanc random this am 31   Plan: To begin CRRT today VR in am Will begin vanc when levels < 20  Height: 5\' 4"  (162.6 cm) Weight: 239 lb 3.2 oz (108.5 kg) IBW/kg (Calculated) : 59.2  Temp (24hrs), Avg:98.7 F (37.1 C), Min:97.8 F (36.6 C), Max:100.3 F (37.9 C)   Recent Labs Lab 03/25/2017 1656 02/23/2017 2041  03/06/17 2033  03/07/17 0048  03/07/17 0505 03/07/17 1631 03/08/17 0322 03/09/17 0628 03/10/17 0433 03/11/17 0223  WBC  --   --   < >  --   < >  --   --  14.1*  --  15.2* 16.2* 12.4* 10.8*  CREATININE  --   --   < > 3.52*  --   --   --  4.13* 4.95* 5.60* 6.72* 7.14* 7.47*  LATICACIDVEN 0.87 1.01  --  2.2*  --  1.7  --   --   --   --   --   --   --   VANCORANDOM  --   --   --   --   --   --   < > 41  --   --  33  --  31  < > = values in this interval not displayed.  Estimated Creatinine Clearance: 11.1 mL/min (A) (by C-G formula based on SCr of 7.47 mg/dL (H)).    Allergies  Allergen Reactions  . Adhesive [Tape] Other (See Comments)    BANDAIDS > REDNESS PAPER TAPE (CAUSES REDNESS & IRRITATION): (03/05/2016)  . Erythromycin Swelling    SWELLING REACTION UNSPECIFIED   . Ibuprofen Other (See Comments)    ABDOMINAL PAIN CRAMPING  . Zocor [Simvastatin] Other (See Comments)    ELEVATED LFT's  . Hydrocodone Other (See Comments)    UNSPECIFIED REACTION    Levester Fresh, PharmD, BCPS, BCCCP Clinical Pharmacist Clinical phone for 03/11/2017 from 7a-3:30p: [phone] If after 3:30p, please call main pharmacy at: [phone] 03/11/2017 7:57 AM

## 2017-03-11 NOTE — Progress Notes (Signed)
[facility] for Infectious Disease    Date of Admission:  03/08/2017   Total days of antibiotics 4        Day 2 vanco (last dose on 5/12)           ID: Lawrence Moore is a 66 y.o. male   s/p L4-5 microdiscectomy on 02/24/17 c/b inadvertent dural tear and CSF leak but was readmitted on 5/10 with fevers and worsening back pain with LE numbness and weakness. Found to have lumbar abscesss/p aspiration of right L4 laminectomy - resulting organism is MRSE.  Pt now in ARF - to begin CRRT today Principal Problem:   Epidural abscess Active Problems:   Hypopituitarism (HCC)   Depression   COPD (chronic obstructive pulmonary disease) (HCC)   Hypokalemia   Acute respiratory failure (HCC)   Acute pulmonary edema (HCC)   Myoclonus    Subjective: Fever up to 100.3 yesterday. Still has worsening creatinine.starting CRRT. Remains intubated and sedated  vanco random 31  Medications:  . chlorhexidine gluconate (MEDLINE KIT)  15 mL Mouth Rinse BID  . fentaNYL (SUBLIMAZE) injection  50 mcg Intravenous Once  . heparin subcutaneous  5,000 Units Subcutaneous Q8H  . hydrocortisone sod succinate (SOLU-CORTEF) inj  25 mg Intravenous Q12H  . lactulose  30 g Per Tube BID  . levothyroxine  75 mcg Intravenous Daily  . mouth rinse  15 mL Mouth Rinse QID  . thiamine injection  100 mg Intravenous Daily    Objective: Vital signs in last 24 hours: Temp:  [97.8 F (36.6 C)-100.3 F (37.9 C)] 98.7 F (37.1 C) (05/17 1149) Pulse Rate:  [66-89] 66 (05/17 1255) Resp:  [22-35] 22 (05/17 1400) BP: (96-253)/(34-115) 253/115 (05/17 1400) SpO2:  [91 %-100 %] 96 % (05/17 1400) FiO2 (%):  [50 %-100 %] 100 % (05/17 1255) Weight:  [239 lb 3.2 oz (108.5 kg)] 239 lb 3.2 oz (108.5 kg) (05/17 0402) Physical Exam  Constitutional: He is intubated and sedated. He appears well-developed and well-nourished. No distress.  HENT:  Mouth/Throat: OETT in place Neck: right ij Cardiovascular: distant heart sounds,  normal s1,s2 Pulmonary/Chest:  Bilateral rhonchi Abdominal: Soft. Bowel sounds are decreased. He exhibits no distension. There is no tenderness.  Neurological: sedated Skin: Skin is warm and dry. No rash noted. No erythema.  Ext: +2 edema   Lab Results  Recent Labs  03/10/17 0433 03/10/17 1144 03/11/17 0223  WBC 12.4*  --  10.8*  HGB 10.5* 10.3* 10.1*  HCT 31.7* 30.3* 30.0*  NA 139  --  139  K 4.2  --  3.7  CL 105  --  101  CO2 21*  --  23  BUN 84*  --  106*  CREATININE 7.14*  --  7.47*   Liver Panel  Recent Labs  03/09/17 0628 03/11/17 0223  ALBUMIN 2.4* 2.0*   Lab Results  Component Value Date   ESRSEDRATE 3 03/25/2017   Lab Results  Component Value Date   CRP 1.0 (H) 03/08/2017    Microbiology: 5/11 aspirate MRSE Studies/Results: Dg Chest Port 1 View  Result Date: 03/11/2017 CLINICAL DATA:  Encounter for central line placement. EXAM: PORTABLE CHEST 1 VIEW COMPARISON:  03/11/2017 FINDINGS: The ET tube tip is above the carina. Right IJ catheter tip is in the projection of the proximal SVC. No pneumothorax visualized. Nasogastric tube tip is below the GE junction. Mild cardiac enlargement. Diffuse bilateral pulmonary opacities are unchanged compared with previous exam. IMPRESSION: 1. Right IJ catheter  placement with tip in the projection of the SVC. No pneumothorax identified. 2. No change in ARDS pattern. Electronically Signed   By: Kerby Moors M.D.   On: 03/11/2017 14:00   Dg Chest Port 1 View  Result Date: 03/11/2017 CLINICAL DATA:  Respiratory failure. EXAM: PORTABLE CHEST 1 VIEW COMPARISON:  03/10/2017. FINDINGS: Endotracheal tube and NG tube in stable position. Cardiomegaly with diffuse bilateral pulmonary infiltrates/ edema noted. No interim change. Small right pleural effusion. No pneumothorax . IMPRESSION: 1. Lines and tubes in stable position. 2. Cardiomegaly with diffuse bilateral pulmonary infiltrates/ edema. Small right pleural effusion. Findings are  unchanged from prior exam. Electronically Signed   By: Marcello Moores  Register   On: 03/11/2017 06:40   Dg Chest Port 1 View  Result Date: 03/10/2017 CLINICAL DATA:  Ventilator dependent respiratory failure, acute and chronic. History of COPD, CHF, former smoker. EXAM: PORTABLE CHEST 1 VIEW COMPARISON:  Chest x-ray of Mar 09, 2017. FINDINGS: The lungs are reasonably well inflated. Fluffy alveolar opacities persist bilaterally but are slightly less conspicuous today. The cardiac silhouette remains enlarged. The pulmonary vascularity remains engorged but is also less conspicuous. The endotracheal tube tip lies 2.3 cm above the carina. The esophagogastric tube tip projects below the inferior margin of the image. There is calcification in the wall of the aortic arch. The observed bony thorax is unremarkable. IMPRESSION: CHF with pulmonary interstitial and alveolar edema slightly improved over yesterday's study. The support tubes are in reasonable position. Thoracic aortic atherosclerosis. Electronically Signed   By: David  Martinique M.D.   On: 03/10/2017 07:22   Dg Abd Portable 1v  Result Date: 03/10/2017 CLINICAL DATA:  Encounter for orogastric tube placement. EXAM: PORTABLE ABDOMEN - 1 VIEW COMPARISON:  None. FINDINGS: Orogastric tube passes below the diaphragm with its tip projecting in the distal stomach. Normal bowel gas pattern. IMPRESSION: Orogastric tube tip projects in the distal stomach. Electronically Signed   By: Lajean Manes M.D.   On: 03/10/2017 17:23     Assessment/Plan: MRSE epidural abscess = still is supratherapeutic on vancomycin at 31 based on today's labs. Continue to follow random vanco level tomorrow. If subtherapeutic, would do daptomycin 22m/kg QOD. Plan to treat for 6-8 wk using 5/12 as day of 1 of treatment   Fever= if he has further fevers, please get repeat blood cultures  aki - thought to be multifactorial due to hypotension/contrast/nsaid/vanco. Continue uop. Will be on CRRT per  renal's recommendation  SLiberty [facility]yp[facility] Hospitalfor [facility] Cell: [phone]: [phone]  03/11/2017, 2:45 PM

## 2017-03-11 NOTE — Progress Notes (Signed)
Subjective:  Did respond to lasix again with 1700 of UOP- but did get 2700 in :(  Kidney numbers worsening- hemodynamically stable  Objective Vital signs in last 24 hours: Vitals:   03/11/17 0401 03/11/17 0402 03/11/17 0500 03/11/17 0600  BP:   (!) 96/40 (!) 106/37  Pulse:      Resp:   (!) 35 (!) 35  Temp: 100.3 F (37.9 C)     TempSrc: Oral     SpO2:   94% 94%  Weight:  108.5 kg (239 lb 3.2 oz)    Height:       Weight change: 9.5 kg (20 lb 15.1 oz)  Intake/Output Summary (Last 24 hours) at 03/11/17 0700 Last data filed at 03/11/17 0600  Gross per 24 hour  Intake          2711.41 ml  Output             1695 ml  Net          1016.41 ml    Assessment/Plan  66 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 He's also has developed acute kidney injury in the setting of hypotension/contrasted study/NSAIDs/supratherapeutic vanc level  1. Renal- baseline creatinine good at 1.1. Starting on 5/12- began to develop AKI thought to be due to hypotension/ATN/contrasted study/NSAIDS and supratherapuetic vanc level, and numbers have worsened daily.  Previously oliguric but now making better urine with the assistance of lasix.  There are no absolute indications for dialysis but with BUN over 100 with no sign of decreasing I recommend that we proceed today.   I would favor for CRRT given low BPs and need to remove volume.  Hopeful for eventual recovery given baseline creatinine.  He would be short term HD candidate but I do not think would be long term HD candidate due to cognitive deficits described by the sister.  Will talk to CCM regarding access placement some time today   2. HTN/vol- is overloaded- Fio2 50% on vent.  However is responding some to lasix, to continue for now with hopes of improvement still 3. Anemia- not a significant issue at this time- hgb over 10 4. Metabolic acidosis- improved some on low dose IV  bicarb- will stop when starting CRRT  5. Epidural abscess- on vanc- last level 31 6. Pan hypopit- on steroids and synthroid- also history of DI but since UOP is not heavy does not need treatment for that at this time 7. VDRF- mostly due to volume but also some MS- per CCM   Armentha Branagan A    Labs: Basic Metabolic Panel:  Recent Labs Lab 03/09/17 0628  03/10/17 0433 03/10/17 1804 03/11/17 0223  NA 137  --  139  --  139  K 4.8  --  4.2  --  3.7  CL 109  --  105  --  101  CO2 13*  --  21*  --  23  GLUCOSE 70  --  121*  --  119*  BUN 67*  --  84*  --  106*  CREATININE 6.72*  --  7.14*  --  7.47*  CALCIUM 8.1*  --  8.4*  --  8.3*  PHOS 7.1*  < > 7.4* 6.4* 6.2*  < > = values in this interval not displayed. Liver Function Tests:  Recent Labs Lab 03/06/17 2033 03/07/17 0505 03/08/17 0322 03/09/17 0628 03/11/17 0223  AST 102* 82* 67*  --   --  ALT 120* 108* 82*  --   --   ALKPHOS 106 112 119  --   --   BILITOT 2.1* 1.1 0.8  --   --   PROT 5.4* 5.4* 5.5*  --   --   ALBUMIN 2.6* 2.6* 2.6* 2.4* 2.0*   No results for input(s): LIPASE, AMYLASE in the last 168 hours.  Recent Labs Lab 03/06/17 2033 03/07/17 1031 03/10/17 0949  AMMONIA 66* 26 40*   CBC:  Recent Labs Lab 03/21/2017 1530  03/06/17 2258 03/07/17 0505 03/08/17 0322 03/09/17 0628 03/10/17 0433 03/10/17 1144 03/11/17 0223  WBC 13.5*  < > 16.1* 14.1* 15.2* 16.2* 12.4*  --  10.8*  NEUTROABS 10.5*  --  15.1*  --  14.7*  --   --   --   --   HGB 13.8  < > 12.7* 11.9* 11.7* 12.2* 10.5* 10.3* 10.1*  HCT 41.5  < > 37.3* 35.7* 33.7* 35.7* 31.7* 30.3* 30.0*  MCV 89.4  < > 91.0 91.5 88.9 90.6 90.1  --  88.5  PLT 160  < > 134* 122* 133* 118* 121* 123* 151  < > = values in this interval not displayed. Cardiac Enzymes: No results for input(s): CKTOTAL, CKMB, CKMBINDEX, TROPONINI in the last 168 hours. CBG:  Recent Labs Lab 03/10/17 1131 03/10/17 1558 03/10/17 1940 03/10/17 2331 03/11/17 0359   GLUCAP 108* 119* 115* 121* 115*    Iron Studies: No results for input(s): IRON, TIBC, TRANSFERRIN, FERRITIN in the last 72 hours. Studies/Results: Dg Chest Port 1 View  Result Date: 03/10/2017 CLINICAL DATA:  Ventilator dependent respiratory failure, acute and chronic. History of COPD, CHF, former smoker. EXAM: PORTABLE CHEST 1 VIEW COMPARISON:  Chest x-ray of Mar 09, 2017. FINDINGS: The lungs are reasonably well inflated. Fluffy alveolar opacities persist bilaterally but are slightly less conspicuous today. The cardiac silhouette remains enlarged. The pulmonary vascularity remains engorged but is also less conspicuous. The endotracheal tube tip lies 2.3 cm above the carina. The esophagogastric tube tip projects below the inferior margin of the image. There is calcification in the wall of the aortic arch. The observed bony thorax is unremarkable. IMPRESSION: CHF with pulmonary interstitial and alveolar edema slightly improved over yesterday's study. The support tubes are in reasonable position. Thoracic aortic atherosclerosis. Electronically Signed   By: David  Martinique M.D.   On: 03/10/2017 07:22   Dg Abd Portable 1v  Result Date: 03/10/2017 CLINICAL DATA:  Encounter for orogastric tube placement. EXAM: PORTABLE ABDOMEN - 1 VIEW COMPARISON:  None. FINDINGS: Orogastric tube passes below the diaphragm with its tip projecting in the distal stomach. Normal bowel gas pattern. IMPRESSION: Orogastric tube tip projects in the distal stomach. Electronically Signed   By: Lajean Manes M.D.   On: 03/10/2017 17:23   Medications: Infusions: . famotidine (PEPCID) IV Stopped (03/10/17 1045)  . feeding supplement (VITAL HIGH PROTEIN) 1,000 mL (03/10/17 1442)  . fentaNYL infusion INTRAVENOUS 250 mcg/hr (03/11/17 0600)  . furosemide Stopped (03/11/17 0612)  .  sodium bicarbonate  infusion 1000 mL 50 mL/hr at 03/11/17 0511    Scheduled Medications: . chlorhexidine gluconate (MEDLINE KIT)  15 mL Mouth Rinse BID   . fentaNYL (SUBLIMAZE) injection  50 mcg Intravenous Once  . heparin subcutaneous  5,000 Units Subcutaneous Q8H  . hydrocortisone sod succinate (SOLU-CORTEF) inj  25 mg Intravenous Q12H  . levothyroxine  75 mcg Intravenous Daily  . mouth rinse  15 mL Mouth Rinse QID  . thiamine injection  100 mg  Intravenous Daily    have reviewed scheduled and prn medications.  Physical Exam: General:sedated, intubated Heart:RRR Lungs: mostly clear Abdomen: distended , non tender Extremities: pitting edema    03/11/2017,7:00 AM  LOS: 7 days

## 2017-03-11 NOTE — Progress Notes (Signed)
No vent SBT d/t fio2 50% w/ sat 90%.  Also, pt unresponsive during my eval & set vent rate at 35.  Waiting on MD to round for plans today.

## 2017-03-11 NOTE — Progress Notes (Signed)
PULMONARY / CRITICAL CARE MEDICINE   Name: Lawrence Moore MRN: [id number] DOB: Jun 16, 1951    ADMISSION DATE:  03/10/2017 CONSULTATION DATE:  03/08/2017  REFERRING MD:  Dr. Kendrick Fries   CHIEF COMPLAINT:  Spinal abscess post right lumbar microdiscectomy  Brief:   66 year old male with PMH of anxiety, asthma, depression, DM, GERD, Hyperlipidemia, hypothyroidism, COPD. Admitted 5/10 with a spinal epidural abscess after a routine lumbar laminectomy for a disc rupture one week prior. MRI revealed new L4-L5 abscess. Underwent IR aspiration on 5/11. Pain had been hard to control and patient was requiring multiple PRN doses of dilaudid. Later in the day on 5/11 patient was found unresponsive with oxygen saturation in the 40s. He was given Narcan and responded well. However, later in the day patient was found aphasic and altered, again responsive to narcan, Neurology consulted, Head CT negative. Patient transferred to ICU and placed on BiPAP, ABG 7.2/50/82. Patient with liver and renal failure with elevated ammonia level. Nephrology consulted. Overnight 5/14 patient SOB requiring lasix administration and ultimately intubation.   SUBJECTIVE:  Remains intubated, less than adequate response to lasix. Renal function not improving.   VITAL SIGNS: BP (!) 116/49   Pulse 79   Temp 99.3 F (37.4 C) (Oral)   Resp (!) 35   Ht 5\' 4"  (1.626 m)   Wt 108.5 kg (239 lb 3.2 oz)   SpO2 93%   BMI 41.06 kg/m   HEMODYNAMICS:    VENTILATOR SETTINGS: Vent Mode: PRVC FiO2 (%):  [50 %] 50 % Set Rate:  [24 bmp-35 bmp] 35 bmp Vt Set:  [480 mL] 480 mL PEEP:  [5 cmH20] 5 cmH20 Plateau Pressure:  [32 cmH20-35 cmH20] 33 cmH20  INTAKE / OUTPUT: I/O last 3 completed shifts: In: 4365.9 [I.V.:2165.9; NG/GT:1870; IV Piggyback:330] Out: 2425 [Urine:2425]  PHYSICAL EXAMINATION:  General:  Obese male in NAD on vent Neuro:  Sedated HEENT:  [place]/AT, PERRL, unable to appreciate JVD due to neck girth Cardiovascular:  RRR,  no MRG. Diffuse edema/anasarca.  Lungs:  Coarse bilateral Abdomen:  Soft, non-distende Musculoskeletal:  No acute deformity Skin:  Intact, MMM    LABS:  BMET  Recent Labs Lab 03/09/17 0628 03/10/17 0433 03/11/17 0223  NA 137 139 139  K 4.8 4.2 3.7  CL 109 105 101  CO2 13* 21* 23  BUN 67* 84* 106*  CREATININE 6.72* 7.14* 7.47*  GLUCOSE 70 121* 119*    Electrolytes  Recent Labs Lab 03/09/17 0628  03/10/17 0433 03/10/17 1804 03/11/17 0223  CALCIUM 8.1*  --  8.4*  --  8.3*  MG  --   < > 2.3 2.2 2.1  PHOS 7.1*  < > 7.4* 6.4* 6.2*  < > = values in this interval not displayed.  CBC  Recent Labs Lab 03/09/17 0628 03/10/17 0433 03/10/17 1144 03/11/17 0223  WBC 16.2* 12.4*  --  10.8*  HGB 12.2* 10.5* 10.3* 10.1*  HCT 35.7* 31.7* 30.3* 30.0*  PLT 118* 121* 123* 151    Coag's  Recent Labs Lab 03/05/17 0641 03/10/17 1144  APTT 32 34  INR 1.16 1.20    Sepsis Markers  Recent Labs Lab 03/10/2017 2041 03/06/17 2033 03/07/17 0048 03/07/17 1031 03/08/17 0322 03/09/17 0628  LATICACIDVEN 1.01 2.2* 1.7  --   --   --   PROCALCITON  --   --   --  38.31 33.36 20.21    ABG  Recent Labs Lab 03/09/17 0118 03/09/17 1240 03/10/17 1150  PHART 7.145* 7.219*  7.267*  PCO2ART 53.9* 45.9 50.4*  PO2ART 153* 118* 74.3*    Liver Enzymes  Recent Labs Lab 03/06/17 2033 03/07/17 0505 03/08/17 0322 03/09/17 0628 03/11/17 0223  AST 102* 82* 67*  --   --   ALT 120* 108* 82*  --   --   ALKPHOS 106 112 119  --   --   BILITOT 2.1* 1.1 0.8  --   --   ALBUMIN 2.6* 2.6* 2.6* 2.4* 2.0*    Cardiac Enzymes No results for input(s): TROPONINI, PROBNP in the last 168 hours.  Glucose  Recent Labs Lab 03/10/17 1131 03/10/17 1558 03/10/17 1940 03/10/17 2331 03/11/17 0359 03/11/17 0740  GLUCAP 108* 119* 115* 121* 115* 120*    Imaging Dg Chest Port 1 View  Result Date: 03/11/2017 CLINICAL DATA:  Respiratory failure. EXAM: PORTABLE CHEST 1 VIEW COMPARISON:   03/10/2017. FINDINGS: Endotracheal tube and NG tube in stable position. Cardiomegaly with diffuse bilateral pulmonary infiltrates/ edema noted. No interim change. Small right pleural effusion. No pneumothorax . IMPRESSION: 1. Lines and tubes in stable position. 2. Cardiomegaly with diffuse bilateral pulmonary infiltrates/ edema. Small right pleural effusion. Findings are unchanged from prior exam. Electronically Signed   By: Marcello Moores  Register   On: 03/11/2017 06:40   Dg Abd Portable 1v  Result Date: 03/10/2017 CLINICAL DATA:  Encounter for orogastric tube placement. EXAM: PORTABLE ABDOMEN - 1 VIEW COMPARISON:  None. FINDINGS: Orogastric tube passes below the diaphragm with its tip projecting in the distal stomach. Normal bowel gas pattern. IMPRESSION: Orogastric tube tip projects in the distal stomach. Electronically Signed   By: Lajean Manes M.D.   On: 03/10/2017 17:23     STUDIES:  MRI L-SPINE W/ & W/O 5/10: Status post right L4 laminectomy with fluid collection in the operative bed causing mass effect on the dorsal aspect of the thecal sac with worsened narrowing of thecal sac compared to the preoperative study. 2. Unchanged distortion of the right lateral recess at L5-S1 secondary to the presence of granulation tissue. PORT CXR 5/11:  Personally reviewed by me. Patchy opacification within right mid and lower lung zone. Full left hilum. No pleural effusion appreciated. CT HEAD/ANGIO NECK/HEAD 5/12: Negative for emergent large vessel occlusion. 2. Diminutive intracranial vessels and moderate distal basilar stenosis. 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 3. High-grade stenosis at the origin of the non dominant right vertebral artery. 4. No carotid stenosis in the neck. COMPLETE ABD U/S 5/13:  Surgical absence of the gallbladder. No bile duct dilatation. Severe fatty infiltration of the liver. Minimal perinephric fluid noted along the lower pole of the left kidney,  nonspecific. EEG 5/14 > Moderate diffuse slowing of the background, focal slowing over the right frontal region   CULTURES: MRSA PCR 4/30:  Negative  Blood Cultures x2 5/10 >>>neg Urine Culture 5/10:  Negative  HIV 5/11:  Nonreactive  L-4 Laminectomy Bed Culture 5/11 > MDR Staph epidermidis (sens to [place], [place], tetracycline Blood Cultures x2 5/12 >>> Stool C diff PCR 5/12:  Negative   ANTIBIOTICS: Rocephin 5/11 >>>5/15 Vancomycin 5/11 >>>  SIGNIFICANT EVENTS: 5/2 > L4-5 microdiscectomy  5/10> Admitted  5/11 > IR aspiration, respiratory distress responded to narcan, another event later in day did not respond, code stroke called > negative  5/12 > Solu-Cortef 100 q8h (Patient on Prednisone 5 mg outpatient)  5/13 > Solu-Cortef decreased to basal dosing  5/15 > Intubated  5/17 > start CRRT  LINES/TUBES: ETT 5/15 >>   DISCUSSION: 65  year old male with epidural abscess now in acute liver and renal failure. On abx per ID. Renal following and will most likely need HD.  ASSESSMENT / PLAN:  PULMONARY A: Acute on Chronic Hypercarbic respiratory Failure in sitting of pulmonary edema with refractory volume overload H/O COPD  P:   Vent bundle, doubt he can wean unitl volume status corrected Daily wua/sbt Daily c x r while on vent CRRT for volume removal VAP bundle  CARDIOVASCULAR A:  HTN P:  Cardiac Monitoring  PRN labetalol for SBP > 142mmHg  RENAL A:   Acute renal failure (Bas Cr 1.1) Non anion gap metabolic acidosis  +Volume Overload  P:   Nephrology following > will start CRRT today. Consent obtained for catheter placement.  Trend BMP Avoid nephrotoxic medications  Bicarb with dextrose at 50 ml/hr   GASTROINTESTINAL A:   Hepatic Failure  Elevated Ammonia  P:   Trend ammonia: 40 on 5/16 Trend LFT  HEMATOLOGIC  A:   Thrombocytopenia P:  Trend CBC   INFECTIOUS A:   Epidural Abscess s/p discectomy on 5/2, IR on 5/11 for aspiration -Procal 33.36 on  5/14  -> 20.21 5/15 P:   Trend WBC and Fever Curve Continue vancomycin, consider change with renal failure Consider re-involve ID with cultures back  ENDOCRINE A:   Hypopituitarism  Hypothyroid    P:   Continue Synthroid Continue Solu-Cortef basal dose  NEUROLOGIC A:   Metabolic Encephalopathy in setting of uremia and elevated ammonia  EEG with diffuse slowing, non-specific P:   RASS goal: 0/-1  Monitor  Neurology following  Hold outpatient Prozac and Cymbalta  Wean fentanyl gtt for RASS goal  Haldol PRN  FAMILY  - Updates: Family updated at bedside   - Inter-disciplinary family meet or Palliative Care meeting due by: 03/13/2017   App CCT 30 mins  Georgann Housekeeper, AGACNP-BC [facility] Pager [phone] or [phone]  03/11/2017 10:50 AM  STAFF NOTE: Linwood Dibbles, MD FACP have personally reviewed patient's available data, including medical history, events of note, physical examination and test results as part of my evaluation. I have discussed with resident/NP and other care providers such as pharmacist, RN and RRT. In addition, I personally evaluated patient and elicited key findings of: not awake, rass -1 to pos 2, not fc, lungs crackles, abdo soft, edema increasing, pcxr which I reviewed, reveals pulm edema incerasing, ett low, retract ett 1 cm, agree need cvvhd and volume removal, will place HD cath now, NO weaning with high MV need, repeat abg now, pcxr in am with hope neg balance, staph epi, to have ID revisit to assess options with vanc in setting arf, cubicin?, T f tolerated, last BM not noted, add lactulose, I updated med poa in room in full The patient is critically ill with multiple organ systems failure and requires high complexity decision making for assessment and support, frequent evaluation and titration of therapies, application of advanced monitoring technologies and extensive interpretation of multiple databases.   Critical  Care Time devoted to patient care services described in this note is 30 Minutes. This time reflects time of care of this signee: Merrie Roof, MD FACP. This critical care time does not reflect procedure time, or teaching time or supervisory time of PA/NP/Med student/Med Resident etc but could involve care discussion time. Rest per NP/medical resident whose note is outlined above and that I agree with   [name]. Titus Mould, MD, [place] Pgr: [facility] 03/11/2017  11:15 AM

## 2017-03-11 NOTE — Procedures (Signed)
Central Venous Catheter Insertion Procedure Note Lawrence Moore [id number] 01/09/1951  Procedure: Insertion of Central Venous Catheter Indications: hd  Procedure Details Consent: Risks of procedure as well as the alternatives and risks of each were explained to the (patient/caregiver).  Consent for procedure obtained. Time Out: Verified patient identification, verified procedure, site/side was marked, verified correct patient position, special equipment/implants available, medications/allergies/relevent history reviewed, required imaging and test results available.  Performed  Maximum sterile technique was used including antiseptics, cap, gloves, gown, hand hygiene, mask and sheet. Skin prep: Chlorhexidine; local anesthetic administered A antimicrobial bonded/coated triple lumen catheter was placed in the right internal jugular vein using the Seldinger technique.  Evaluation Blood flow good Complications: No apparent complications Patient did tolerate procedure well. Chest X-ray ordered to verify placement.  CXR: pending.  Lawrence Moore 03/11/2017, 12:28 PM  US guidance  Lawrence Moore. Lawrence Mould, MD, [place] Pgr: [facility]

## 2017-03-12 ENCOUNTER — Inpatient Hospital Stay (HOSPITAL_COMMUNITY): Payer: Medicare Other

## 2017-03-12 DIAGNOSIS — N179 Acute kidney failure, unspecified: Secondary | ICD-10-CM

## 2017-03-12 DIAGNOSIS — R509 Fever, unspecified: Secondary | ICD-10-CM

## 2017-03-12 LAB — CBC
HEMATOCRIT: 32.1 % — AB (ref 39.0–52.0)
HEMOGLOBIN: 10.8 g/dL — AB (ref 13.0–17.0)
MCH: 29.8 pg (ref 26.0–34.0)
MCHC: 33.6 g/dL (ref 30.0–36.0)
MCV: 88.7 fL (ref 78.0–100.0)
Platelets: 177 10*3/uL (ref 150–400)
RBC: 3.62 MIL/uL — ABNORMAL LOW (ref 4.22–5.81)
RDW: 14.9 % (ref 11.5–15.5)
WBC: 12.6 10*3/uL — AB (ref 4.0–10.5)

## 2017-03-12 LAB — GLUCOSE, CAPILLARY
GLUCOSE-CAPILLARY: 85 mg/dL (ref 65–99)
GLUCOSE-CAPILLARY: 91 mg/dL (ref 65–99)
Glucose-Capillary: 110 mg/dL — ABNORMAL HIGH (ref 65–99)
Glucose-Capillary: 79 mg/dL (ref 65–99)
Glucose-Capillary: 79 mg/dL (ref 65–99)

## 2017-03-12 LAB — BLOOD GAS, ARTERIAL
ACID-BASE EXCESS: 3.6 mmol/L — AB (ref 0.0–2.0)
Bicarbonate: 27.8 mmol/L (ref 20.0–28.0)
DRAWN BY: 244851
FIO2: 50
MECHVT: 480 mL
O2 Saturation: 92.4 %
PEEP/CPAP: 5 cmH2O
PO2 ART: 66.8 mmHg — AB (ref 83.0–108.0)
Patient temperature: 98.7
RATE: 35 resp/min
pCO2 arterial: 43.7 mmHg (ref 32.0–48.0)
pH, Arterial: 7.421 (ref 7.350–7.450)

## 2017-03-12 LAB — CULTURE, BLOOD (ROUTINE X 2)
CULTURE: NO GROWTH
CULTURE: NO GROWTH
SPECIAL REQUESTS: ADEQUATE
SPECIAL REQUESTS: ADEQUATE

## 2017-03-12 LAB — POCT ACTIVATED CLOTTING TIME
ACTIVATED CLOTTING TIME: 191 s
ACTIVATED CLOTTING TIME: 191 s
ACTIVATED CLOTTING TIME: 191 s
ACTIVATED CLOTTING TIME: 197 s
ACTIVATED CLOTTING TIME: 202 s
ACTIVATED CLOTTING TIME: 197 s
Activated Clotting Time: 186 seconds
Activated Clotting Time: 186 seconds
Activated Clotting Time: 191 seconds
Activated Clotting Time: 197 seconds

## 2017-03-12 LAB — VANCOMYCIN, RANDOM: VANCOMYCIN RM: 18

## 2017-03-12 LAB — MAGNESIUM: Magnesium: 2.3 mg/dL (ref 1.7–2.4)

## 2017-03-12 LAB — RENAL FUNCTION PANEL
ALBUMIN: 1.8 g/dL — AB (ref 3.5–5.0)
ANION GAP: 12 (ref 5–15)
Albumin: 2.2 g/dL — ABNORMAL LOW (ref 3.5–5.0)
Anion gap: 8 (ref 5–15)
BUN: 76 mg/dL — AB (ref 6–20)
BUN: 54 mg/dL — AB (ref 6–20)
CALCIUM: 7.3 mg/dL — AB (ref 8.9–10.3)
CHLORIDE: 99 mmol/L — AB (ref 101–111)
CO2: 28 mmol/L (ref 22–32)
CO2: 25 mmol/L (ref 22–32)
CREATININE: 2.55 mg/dL — AB (ref 0.61–1.24)
Calcium: 8.4 mg/dL — ABNORMAL LOW (ref 8.9–10.3)
Chloride: 110 mmol/L (ref 101–111)
Creatinine, Ser: 4.08 mg/dL — ABNORMAL HIGH (ref 0.61–1.24)
GFR calc Af Amer: 29 mL/min — ABNORMAL LOW (ref >60)
GFR calc non Af Amer: 14 mL/min — ABNORMAL LOW (ref >60)
GFR calc non Af Amer: 25 mL/min — ABNORMAL LOW (ref >60)
GFR, EST AFRICAN AMERICAN: 16 mL/min — AB (ref >60)
GLUCOSE: 103 mg/dL — AB (ref 65–99)
Glucose, Bld: 120 mg/dL — ABNORMAL HIGH (ref 65–99)
PHOSPHORUS: 5.4 mg/dL — AB (ref 2.5–4.6)
PHOSPHORUS: 3.3 mg/dL (ref 2.5–4.6)
POTASSIUM: 3.9 mmol/L (ref 3.5–5.1)
Potassium: 3.2 mmol/L — ABNORMAL LOW (ref 3.5–5.1)
SODIUM: 143 mmol/L (ref 135–145)
Sodium: 139 mmol/L (ref 135–145)

## 2017-03-12 LAB — COMPREHENSIVE METABOLIC PANEL
ALT: 67 U/L — ABNORMAL HIGH (ref 17–63)
AST: 96 U/L — AB (ref 15–41)
Albumin: 2.2 g/dL — ABNORMAL LOW (ref 3.5–5.0)
Alkaline Phosphatase: 141 U/L — ABNORMAL HIGH (ref 38–126)
Anion gap: 13 (ref 5–15)
BUN: 74 mg/dL — AB (ref 6–20)
CHLORIDE: 99 mmol/L — AB (ref 101–111)
CO2: 28 mmol/L (ref 22–32)
Calcium: 8.4 mg/dL — ABNORMAL LOW (ref 8.9–10.3)
Creatinine, Ser: 4.12 mg/dL — ABNORMAL HIGH (ref 0.61–1.24)
GFR calc Af Amer: 16 mL/min — ABNORMAL LOW (ref >60)
GFR, EST NON AFRICAN AMERICAN: 14 mL/min — AB (ref >60)
Glucose, Bld: 120 mg/dL — ABNORMAL HIGH (ref 65–99)
POTASSIUM: 3.9 mmol/L (ref 3.5–5.1)
Sodium: 140 mmol/L (ref 135–145)
Total Bilirubin: 0.7 mg/dL (ref 0.3–1.2)
Total Protein: 5.8 g/dL — ABNORMAL LOW (ref 6.5–8.1)

## 2017-03-12 LAB — APTT: aPTT: 68 seconds — ABNORMAL HIGH (ref 24–36)

## 2017-03-12 MED ORDER — SODIUM CHLORIDE 0.9 % IV SOLN
INTRAVENOUS | Status: DC
  Administered 2017-03-12: 10:00:00 via INTRAVENOUS

## 2017-03-12 MED ORDER — DAPTOMYCIN 500 MG IV SOLR
8.0000 mg/kg | INTRAVENOUS | Status: DC
  Administered 2017-03-12 – 2017-03-14 (×2): 849.5 mg via INTRAVENOUS
  Filled 2017-03-12 (×3): qty 16.99

## 2017-03-12 NOTE — Progress Notes (Signed)
Subjective:  CRRT started yesterday - still making urine 1500 last 24 hours- lasix Objective Vital signs in last 24 hours: Vitals:   03/12/17 0700 03/12/17 0739 03/12/17 0745 03/12/17 0800  BP: (!) 118/56  (!) 109/55 (!) 116/56  Pulse:   69 63  Resp: (!) 35  (!) 28 (!) 35  Temp:  98.7 F (37.1 C)    TempSrc:  Oral    SpO2: 94%  96% 94%  Weight:      Height:       Weight change: -2.3 kg (-5 lb 1.1 oz)  Intake/Output Summary (Last 24 hours) at 03/12/17 4098 Last data filed at 03/12/17 0800  Gross per 24 hour  Intake          3139.75 ml  Output             5782 ml  Net         -2642.25 ml    Assessment/Plan  66 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 He's also has developed acute kidney injury in the setting of hypotension/contrasted study/NSAIDs/supratherapeutic vanc level  1. Renal- baseline creatinine good at 1.1. Starting on 5/12- began to develop AKI thought to be due to hypotension/ATN/contrasted study/NSAIDS and supratherapuetic vanc level, and numbers have worsened daily.  Previously oliguric but now making better urine with the assistance of lasix.  CRRT started 5/17- I wanted CRRT because lots of fluid to remove and soft BP.   Hopeful for eventual recovery given baseline creatinine.  He would be short term HD candidate but I do not think would be long term HD candidate due to cognitive deficits described by the sister.  2. HTN/vol- is overloaded- Fio2 50% on vent. Is 2 liters negative with UOP and CRRT- continue with volume removal-100 per hour 3. Anemia- not a significant issue at this time- hgb over 10 4. Metabolic acidosis- improved some on low dose IV bicarb- will stop extra bicarb 5. Epidural abscess- on vanc- last level 18- to start dapto per ID 6. Pan hypopit- on steroids and synthroid- also history of DI but since UOP is not heavy does not need treatment for that at this time 7.  VDRF- mostly due to volume but also some MS- per CCM   Antaeus Karel A    Labs: Basic Metabolic Panel:  Recent Labs Lab 03/11/17 0223 03/11/17 1533 03/12/17 0321  NA 139 141 140  139  K 3.7 3.7 3.9  3.9  CL 101 101 99*  99*  CO2 23 25 28  28   GLUCOSE 119* 127* 120*  120*  BUN 106* 112* 74*  76*  CREATININE 7.47* 6.65* 4.12*  4.08*  CALCIUM 8.3* 8.1* 8.4*  8.4*  PHOS 6.2* 7.1* 5.4*   Liver Function Tests:  Recent Labs Lab 03/07/17 0505 03/08/17 0322  03/11/17 0223 03/11/17 1533 03/12/17 0321  AST 82* 67*  --   --   --  96*  ALT 108* 82*  --   --   --  67*  ALKPHOS 112 119  --   --   --  141*  BILITOT 1.1 0.8  --   --   --  0.7  PROT 5.4* 5.5*  --   --   --  5.8*  ALBUMIN 2.6* 2.6*  < > 2.0* 1.8* 2.2*  2.2*  < > = values in this interval not displayed. No results for input(s): LIPASE, AMYLASE in the last 168 hours.  Recent  Labs Lab 03/06/17 2033 03/07/17 1031 03/10/17 0949  AMMONIA 66* 26 40*   CBC:  Recent Labs Lab 03/06/17 2258  03/08/17 0322 03/09/17 0628 03/10/17 0433 03/10/17 1144 03/11/17 0223 03/12/17 0321  WBC 16.1*  < > 15.2* 16.2* 12.4*  --  10.8* 12.6*  NEUTROABS 15.1*  --  14.7*  --   --   --   --   --   HGB 12.7*  < > 11.7* 12.2* 10.5* 10.3* 10.1* 10.8*  HCT 37.3*  < > 33.7* 35.7* 31.7* 30.3* 30.0* 32.1*  MCV 91.0  < > 88.9 90.6 90.1  --  88.5 88.7  PLT 134*  < > 133* 118* 121* 123* 151 177  < > = values in this interval not displayed. Cardiac Enzymes: No results for input(s): CKTOTAL, CKMB, CKMBINDEX, TROPONINI in the last 168 hours. CBG:  Recent Labs Lab 03/11/17 1511 03/11/17 1937 03/11/17 2349 03/12/17 0341 03/12/17 0741  GLUCAP 102* 74 82 110* 85    Iron Studies: No results for input(s): IRON, TIBC, TRANSFERRIN, FERRITIN in the last 72 hours. Studies/Results: Dg Chest Port 1 View  Result Date: 03/12/2017 CLINICAL DATA:  Intubation . EXAM: PORTABLE CHEST 1 VIEW COMPARISON:  03/11/2017 . FINDINGS:  Endotracheal tube, NG tube, right IJ line stable position. Heart size normal. Diffuse bilateral pulmonary infiltrates/edema. No change from prior exam. Small bilateral pleural effusions. No pneumothorax . IMPRESSION: 1. Lines and tubes in stable position. 2. Diffuse bilateral pulmonary infiltrates/edema. No change from prior exam. Electronically Signed   By: Marcello Moores  Register   On: 03/12/2017 07:03   Dg Chest Port 1 View  Result Date: 03/11/2017 CLINICAL DATA:  Encounter for central line placement. EXAM: PORTABLE CHEST 1 VIEW COMPARISON:  03/11/2017 FINDINGS: The ET tube tip is above the carina. Right IJ catheter tip is in the projection of the proximal SVC. No pneumothorax visualized. Nasogastric tube tip is below the GE junction. Mild cardiac enlargement. Diffuse bilateral pulmonary opacities are unchanged compared with previous exam. IMPRESSION: 1. Right IJ catheter placement with tip in the projection of the SVC. No pneumothorax identified. 2. No change in ARDS pattern. Electronically Signed   By: Kerby Moors M.D.   On: 03/11/2017 14:00   Dg Chest Port 1 View  Result Date: 03/11/2017 CLINICAL DATA:  Respiratory failure. EXAM: PORTABLE CHEST 1 VIEW COMPARISON:  03/10/2017. FINDINGS: Endotracheal tube and NG tube in stable position. Cardiomegaly with diffuse bilateral pulmonary infiltrates/ edema noted. No interim change. Small right pleural effusion. No pneumothorax . IMPRESSION: 1. Lines and tubes in stable position. 2. Cardiomegaly with diffuse bilateral pulmonary infiltrates/ edema. Small right pleural effusion. Findings are unchanged from prior exam. Electronically Signed   By: Marcello Moores  Register   On: 03/11/2017 06:40   Dg Abd Portable 1v  Result Date: 03/10/2017 CLINICAL DATA:  Encounter for orogastric tube placement. EXAM: PORTABLE ABDOMEN - 1 VIEW COMPARISON:  None. FINDINGS: Orogastric tube passes below the diaphragm with its tip projecting in the distal stomach. Normal bowel gas pattern.  IMPRESSION: Orogastric tube tip projects in the distal stomach. Electronically Signed   By: Lajean Manes M.D.   On: 03/10/2017 17:23   Medications: Infusions: . DAPTOmycin (CUBICIN)  IV    . famotidine (PEPCID) IV Stopped (03/11/17 1014)  . feeding supplement (VITAL HIGH PROTEIN) 1,000 mL (03/12/17 0800)  . fentaNYL infusion INTRAVENOUS 400 mcg/hr (03/12/17 0800)  . furosemide Stopped (03/12/17 0701)  . heparin 10,000 units/ 20 mL infusion syringe 1,000 Units/hr (03/12/17 0800)  .  heparin    . dialysis replacement fluid (prismasate) 300 mL/hr at 03/12/17 0824  . dialysis replacement fluid (prismasate) 350 mL/hr at 03/12/17 0825  . dialysate (PRISMASATE) 1,500 mL/hr at 03/12/17 0823  .  sodium bicarbonate  infusion 1000 mL 50 mL/hr at 03/12/17 0800    Scheduled Medications: . chlorhexidine gluconate (MEDLINE KIT)  15 mL Mouth Rinse BID  . fentaNYL (SUBLIMAZE) injection  50 mcg Intravenous Once  . heparin subcutaneous  5,000 Units Subcutaneous Q8H  . hydrocortisone sod succinate (SOLU-CORTEF) inj  25 mg Intravenous Q12H  . lactulose  30 g Per Tube BID  . levothyroxine  75 mcg Intravenous Daily  . mouth rinse  15 mL Mouth Rinse QID  . thiamine injection  100 mg Intravenous Daily    have reviewed scheduled and prn medications.  Physical Exam: General:sedated, intubated Heart:RRR Lungs: mostly clear Abdomen: distended , non tender Extremities: pitting edema    03/12/2017,8:38 AM  LOS: 8 days

## 2017-03-12 NOTE — Progress Notes (Signed)
PULMONARY / CRITICAL CARE MEDICINE   Name: Lawrence Moore MRN: [id number] DOB: Mar 23, 1951    ADMISSION DATE:  03/07/2017 CONSULTATION DATE:  03/08/2017  REFERRING MD:  Dr. Kendrick Fries   CHIEF COMPLAINT:  Spinal abscess post right lumbar microdiscectomy  Brief:   66 year old male with PMH of anxiety, asthma, depression, DM, GERD, Hyperlipidemia, hypothyroidism, COPD. Admitted 5/10 with a spinal epidural abscess after a routine lumbar laminectomy for a disc rupture one week prior. MRI revealed new L4-L5 abscess. Underwent IR aspiration on 5/11. Pain had been hard to control and patient was requiring multiple PRN doses of dilaudid. Later in the day on 5/11 patient was found unresponsive with oxygen saturation in the 40s. He was given Narcan and responded well. However, later in the day patient was found aphasic and altered, again responsive to narcan, Neurology consulted, Head CT negative. Patient transferred to ICU and placed on BiPAP, ABG 7.2/50/82. Patient with liver and renal failure with elevated ammonia level. Nephrology consulted. Overnight 5/14 patient SOB requiring lasix administration and ultimately intubation.   SUBJECTIVE:  Remains intubated and sedated.  HD catheter placed yesterday and started on CRRT.   VITAL SIGNS: BP (!) 109/55   Pulse 69   Temp 98.7 F (37.1 C) (Oral)   Resp (!) 28   Ht 5\' 4"  (1.626 m)   Wt 234 lb 2.1 oz (106.2 kg)   SpO2 96%   BMI 40.19 kg/m   HEMODYNAMICS:    VENTILATOR SETTINGS: Vent Mode: PRVC FiO2 (%):  [50 %-100 %] 50 % Set Rate:  [35 bmp] 35 bmp Vt Set:  [480 mL] 480 mL PEEP:  [5 cmH20] 5 cmH20 Plateau Pressure:  [30 cmH20-32 cmH20] 30 cmH20  INTAKE / OUTPUT: I/O last 3 completed shifts: In: 4580.3 [I.V.:2015.3; Other:140; NG/GT:2095; IV Piggyback:330] Out: 6295 [Urine:2516; Other:3121; Stool:800]  PHYSICAL EXAMINATION:  General:  Obese male in NAD on vent Neuro:  Sedated HEENT:  [place]/AT, PERRL, right HD catheter  Cardiovascular:   RRR, no MRG. Diffuse edema/anasarca.  Lungs:  Coarse bilateral Abdomen:  Soft, non-distende Musculoskeletal:  No acute deformity Skin:  Intact, MMM    LABS:  BMET  Recent Labs Lab 03/11/17 0223 03/11/17 1533 03/12/17 0321  NA 139 141 140  139  K 3.7 3.7 3.9  3.9  CL 101 101 99*  99*  CO2 23 25 28  28   BUN 106* 112* 74*  76*  CREATININE 7.47* 6.65* 4.12*  4.08*  GLUCOSE 119* 127* 120*  120*    Electrolytes  Recent Labs Lab 03/10/17 1804 03/11/17 0223 03/11/17 1533 03/12/17 0321  CALCIUM  --  8.3* 8.1* 8.4*  8.4*  MG 2.2 2.1  --  2.3  PHOS 6.4* 6.2* 7.1* 5.4*    CBC  Recent Labs Lab 03/10/17 0433 03/10/17 1144 03/11/17 0223 03/12/17 0321  WBC 12.4*  --  10.8* 12.6*  HGB 10.5* 10.3* 10.1* 10.8*  HCT 31.7* 30.3* 30.0* 32.1*  PLT 121* 123* 151 177    Coag's  Recent Labs Lab 03/10/17 1144 03/12/17 0321  APTT 34 68*  INR 1.20  --     Sepsis Markers  Recent Labs Lab 03/06/17 2033 03/07/17 0048 03/07/17 1031 03/08/17 0322 03/09/17 0628  LATICACIDVEN 2.2* 1.7  --   --   --   PROCALCITON  --   --  38.31 33.36 20.21    ABG  Recent Labs Lab 03/09/17 1240 03/10/17 1150 03/11/17 1250  PHART 7.219* 7.267* 7.387  PCO2ART 45.9 50.4* 44.2  PO2ART 118* 74.3* 323*    Liver Enzymes  Recent Labs Lab 03/07/17 0505 03/08/17 0322  03/11/17 0223 03/11/17 1533 03/12/17 0321  AST 82* 67*  --   --   --  96*  ALT 108* 82*  --   --   --  67*  ALKPHOS 112 119  --   --   --  141*  BILITOT 1.1 0.8  --   --   --  0.7  ALBUMIN 2.6* 2.6*  < > 2.0* 1.8* 2.2*  2.2*  < > = values in this interval not displayed.  Cardiac Enzymes No results for input(s): TROPONINI, PROBNP in the last 168 hours.  Glucose  Recent Labs Lab 03/11/17 1148 03/11/17 1511 03/11/17 1937 03/11/17 2349 03/12/17 0341 03/12/17 0741  GLUCAP 107* 102* 74 82 110* 85    Imaging Dg Chest Port 1 View  Result Date: 03/12/2017 CLINICAL DATA:  Intubation . EXAM:  PORTABLE CHEST 1 VIEW COMPARISON:  03/11/2017 . FINDINGS: Endotracheal tube, NG tube, right IJ line stable position. Heart size normal. Diffuse bilateral pulmonary infiltrates/edema. No change from prior exam. Small bilateral pleural effusions. No pneumothorax . IMPRESSION: 1. Lines and tubes in stable position. 2. Diffuse bilateral pulmonary infiltrates/edema. No change from prior exam. Electronically Signed   By: Marcello Moores  Register   On: 03/12/2017 07:03   Dg Chest Port 1 View  Result Date: 03/11/2017 CLINICAL DATA:  Encounter for central line placement. EXAM: PORTABLE CHEST 1 VIEW COMPARISON:  03/11/2017 FINDINGS: The ET tube tip is above the carina. Right IJ catheter tip is in the projection of the proximal SVC. No pneumothorax visualized. Nasogastric tube tip is below the GE junction. Mild cardiac enlargement. Diffuse bilateral pulmonary opacities are unchanged compared with previous exam. IMPRESSION: 1. Right IJ catheter placement with tip in the projection of the SVC. No pneumothorax identified. 2. No change in ARDS pattern. Electronically Signed   By: Kerby Moors M.D.   On: 03/11/2017 14:00     STUDIES:  MRI L-SPINE W/ & W/O 5/10: Status post right L4 laminectomy with fluid collection in the operative bed causing mass effect on the dorsal aspect of the thecal sac with worsened narrowing of thecal sac compared to the preoperative study. 2. Unchanged distortion of the right lateral recess at L5-S1 secondary to the presence of granulation tissue. PORT CXR 5/11:  Personally reviewed by me. Patchy opacification within right mid and lower lung zone. Full left hilum. No pleural effusion appreciated. CT HEAD/ANGIO NECK/HEAD 5/12: Negative for emergent large vessel occlusion. 2. Diminutive intracranial vessels and moderate distal basilar stenosis. 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 3. High-grade stenosis at the origin of the non dominant right vertebral artery. 4. No carotid  stenosis in the neck. COMPLETE ABD U/S 5/13:  Surgical absence of the gallbladder. No bile duct dilatation. Severe fatty infiltration of the liver. Minimal perinephric fluid noted along the lower pole of the left kidney, nonspecific. EEG 5/14 > Moderate diffuse slowing of the background, focal slowing over the right frontal region   CULTURES: MRSA PCR 4/30:  Negative  Blood Cultures x2 5/10 >>>neg Urine Culture 5/10:  Negative  HIV 5/11:  Nonreactive  L-4 Laminectomy Bed Culture 5/11 > MDR Staph epidermidis (sens to [place], [place], tetracycline Blood Cultures x2 5/12 >>> Stool C diff PCR 5/12:  Negative   ANTIBIOTICS: Rocephin 5/11 >>>5/15 Vancomycin 5/11 >>>  SIGNIFICANT EVENTS: 5/2 > L4-5 microdiscectomy  5/10> Admitted  5/11 > IR aspiration, respiratory distress  responded to narcan, another event later in day did not respond, code stroke called > negative  5/12 > Solu-Cortef 100 q8h (Patient on Prednisone 5 mg outpatient)  5/13 > Solu-Cortef decreased to basal dosing  5/15 > Intubated  5/17 > start CRRT  LINES/TUBES: ETT 5/15 >>   DISCUSSION: 66 year old male with epidural abscess now in acute liver and renal failure. On abx per ID. Renal following and will most likely need HD.  ASSESSMENT / PLAN:  PULMONARY A: Acute on Chronic Hypercarbic respiratory Failure in sitting of pulmonary edema with refractory volume overload H/O COPD  P:   Vent bundle Daily c x r while on vent CRRT for volume removal VAP bundle  CARDIOVASCULAR A:  HTN P:  Cardiac Monitoring  PRN labetalol for SBP > 188mmHg  RENAL A:   Acute renal failure (Bas Cr 1.1) Non anion gap metabolic acidosis  +Volume Overload  P:   Nephrology following > on CRRT  Trend BMP Avoid nephrotoxic medications  Bicarb with dextrose at 50 ml/hr   GASTROINTESTINAL A:   Hepatic Failure  Elevated Ammonia  P:   Trend LFT  HEMATOLOGIC  A:   Thrombocytopenia > resolved Normocytic anemia > hgb stable  P:   Trend CBC   INFECTIOUS A:   MRSE Epidural Abscess s/p discectomy on 5/2, IR on 5/11 for aspiration -Procal 33.36 on 5/14  -> 20.21 5/15 P:   Trend WBC and Fever Curve Continue dapto ID following, treat with vanc for 6-8 weeks  ENDOCRINE A:   Hypopituitarism  Hypothyroid    P:   Continue Synthroid Continue Solu-Cortef basal dose  NEUROLOGIC A:   Metabolic Encephalopathy in setting of uremia and elevated ammonia  EEG with diffuse slowing, non-specific P:   RASS goal: 0/-1  Monitor  Neurology following  Hold outpatient Prozac and Cymbalta  Wean fentanyl gtt for RASS goal  Haldol PRN   -Kalman Shan PGY-1  STAFF NOTE: Linwood Dibbles, MD FACP have personally reviewed patient's available data, including medical history, events of note, physical examination and test results as part of my evaluation. I have discussed with resident/NP and other care providers such as pharmacist, RN and RRT. In addition, I personally evaluated patient and elicited key findings of: awakens, dos not fc, grimacing to pain, moves all ext equally, lungs less coasre, reduced, abdo obese, soft, edema, was neg 2 liters on cvvhd, remains on HIGH mV needs on vent, obtain abg now to ensure need rate 35, NO SBT with high MV needs currently, maintain neg balance cvvhd, some improvement in edema I reviewed on pcxr, bicarb off, lasix on going, chem in am , if we ca nlower MV then SBT likely, if neurostaus not improved after BUN down, will need MRI brain to r/o abscess, eeg done prior and I reviewed, feeding tolerated, WUA mandatory,m avoid benzo as able, dapto today as avoiding vanc with renal failure, I updated med poa The patient is critically ill with multiple organ systems failure and requires high complexity decision making for assessment and support, frequent evaluation and titration of therapies, application of advanced monitoring technologies and extensive interpretation of multiple databases.    Critical Care Time devoted to patient care services described in this note is 35 Minutes. This time reflects time of care of this signee: Merrie Roof, MD FACP. This critical care time does not reflect procedure time, or teaching time or supervisory time of PA/NP/Med student/Med Resident etc but could involve care discussion time. Rest per  NP/medical resident whose note is outlined above and that I agree with   [name]. Titus Mould, MD, [place] Pgr: [facility] 03/12/2017 9:05 AM

## 2017-03-13 ENCOUNTER — Inpatient Hospital Stay (HOSPITAL_COMMUNITY): Payer: Medicare Other

## 2017-03-13 LAB — BLOOD GAS, ARTERIAL
Acid-Base Excess: 3.4 mmol/L — ABNORMAL HIGH (ref 0.0–2.0)
Bicarbonate: 28.3 mmol/L — ABNORMAL HIGH (ref 20.0–28.0)
DRAWN BY: 502221
FIO2: 50
MECHVT: 480 mL
O2 Saturation: 90.2 %
PEEP: 5 cmH2O
Patient temperature: 98.6
RATE: 35 resp/min
pCO2 arterial: 49.7 mmHg — ABNORMAL HIGH (ref 32.0–48.0)
pH, Arterial: 7.374 (ref 7.350–7.450)
pO2, Arterial: 64.7 mmHg — ABNORMAL LOW (ref 83.0–108.0)

## 2017-03-13 LAB — GLUCOSE, CAPILLARY
GLUCOSE-CAPILLARY: 81 mg/dL (ref 65–99)
GLUCOSE-CAPILLARY: 83 mg/dL (ref 65–99)
Glucose-Capillary: 101 mg/dL — ABNORMAL HIGH (ref 65–99)
Glucose-Capillary: 101 mg/dL — ABNORMAL HIGH (ref 65–99)
Glucose-Capillary: 102 mg/dL — ABNORMAL HIGH (ref 65–99)
Glucose-Capillary: 66 mg/dL (ref 65–99)
Glucose-Capillary: 73 mg/dL (ref 65–99)
Glucose-Capillary: 93 mg/dL (ref 65–99)

## 2017-03-13 LAB — POCT ACTIVATED CLOTTING TIME
ACTIVATED CLOTTING TIME: 197 s
ACTIVATED CLOTTING TIME: 197 s
ACTIVATED CLOTTING TIME: 213 s
Activated Clotting Time: 202 seconds
Activated Clotting Time: 208 seconds
Activated Clotting Time: 213 seconds
Activated Clotting Time: 219 seconds

## 2017-03-13 LAB — CBC
HEMATOCRIT: 32.2 % — AB (ref 39.0–52.0)
Hemoglobin: 10.6 g/dL — ABNORMAL LOW (ref 13.0–17.0)
MCH: 29.9 pg (ref 26.0–34.0)
MCHC: 32.9 g/dL (ref 30.0–36.0)
MCV: 90.7 fL (ref 78.0–100.0)
Platelets: 206 10*3/uL (ref 150–400)
RBC: 3.55 MIL/uL — AB (ref 4.22–5.81)
RDW: 15.2 % (ref 11.5–15.5)
WBC: 16.2 10*3/uL — AB (ref 4.0–10.5)

## 2017-03-13 LAB — RENAL FUNCTION PANEL
Albumin: 2.2 g/dL — ABNORMAL LOW (ref 3.5–5.0)
Anion gap: 9 (ref 5–15)
BUN: 48 mg/dL — ABNORMAL HIGH (ref 6–20)
CALCIUM: 8.6 mg/dL — AB (ref 8.9–10.3)
CO2: 26 mmol/L (ref 22–32)
CREATININE: 1.84 mg/dL — AB (ref 0.61–1.24)
Chloride: 104 mmol/L (ref 101–111)
GFR, EST AFRICAN AMERICAN: 43 mL/min — AB (ref >60)
GFR, EST NON AFRICAN AMERICAN: 37 mL/min — AB (ref >60)
Glucose, Bld: 118 mg/dL — ABNORMAL HIGH (ref 65–99)
PHOSPHORUS: 3.2 mg/dL (ref 2.5–4.6)
Potassium: 4.1 mmol/L (ref 3.5–5.1)
SODIUM: 139 mmol/L (ref 135–145)

## 2017-03-13 LAB — BASIC METABOLIC PANEL
ANION GAP: 9 (ref 5–15)
BUN: 55 mg/dL — ABNORMAL HIGH (ref 6–20)
CO2: 28 mmol/L (ref 22–32)
Calcium: 8.4 mg/dL — ABNORMAL LOW (ref 8.9–10.3)
Chloride: 102 mmol/L (ref 101–111)
Creatinine, Ser: 2.24 mg/dL — ABNORMAL HIGH (ref 0.61–1.24)
GFR calc Af Amer: 34 mL/min — ABNORMAL LOW (ref >60)
GFR, EST NON AFRICAN AMERICAN: 29 mL/min — AB (ref >60)
GLUCOSE: 107 mg/dL — AB (ref 65–99)
POTASSIUM: 3.8 mmol/L (ref 3.5–5.1)
Sodium: 139 mmol/L (ref 135–145)

## 2017-03-13 LAB — MAGNESIUM: MAGNESIUM: 2.4 mg/dL (ref 1.7–2.4)

## 2017-03-13 LAB — CK: Total CK: 240 U/L (ref 49–397)

## 2017-03-13 LAB — APTT: aPTT: 102 seconds — ABNORMAL HIGH (ref 24–36)

## 2017-03-13 MED ORDER — FAMOTIDINE 40 MG/5ML PO SUSR
20.0000 mg | Freq: Every day | ORAL | Status: DC
  Administered 2017-03-14: 20 mg
  Filled 2017-03-13: qty 2.5

## 2017-03-13 MED ORDER — NOREPINEPHRINE BITARTRATE 1 MG/ML IV SOLN
0.0000 ug/min | INTRAVENOUS | Status: DC
  Administered 2017-03-13: 10 ug/min via INTRAVENOUS
  Filled 2017-03-13: qty 4

## 2017-03-13 MED ORDER — FAMOTIDINE 40 MG/5ML PO SUSR: 20.0000 mg | Freq: Two times a day (BID) | ORAL | Status: DC

## 2017-03-13 MED ORDER — SODIUM CHLORIDE 0.9 % IV BOLUS (SEPSIS)
1000.0000 mL | Freq: Once | INTRAVENOUS | Status: AC
  Administered 2017-03-13: 1000 mL via INTRAVENOUS

## 2017-03-13 MED ORDER — NOREPINEPHRINE BITARTRATE 1 MG/ML IV SOLN
0.0000 ug/min | INTRAVENOUS | Status: DC
  Administered 2017-03-13: 10 ug/min via INTRAVENOUS
  Filled 2017-03-13: qty 16

## 2017-03-13 MED ORDER — PROPOFOL 1000 MG/100ML IV EMUL
5.0000 ug/kg/min | INTRAVENOUS | Status: DC
  Administered 2017-03-13: 20 ug/kg/min via INTRAVENOUS
  Filled 2017-03-13: qty 100

## 2017-03-13 NOTE — Progress Notes (Signed)
PULMONARY / CRITICAL CARE MEDICINE   Name: Lawrence Moore MRN: [id number] DOB: [birth year]    ADMISSION DATE:  03/01/2017 CONSULTATION DATE:  03/08/2017  REFERRING MD:  Dr. Kendrick Fries   CHIEF COMPLAINT:  Spinal abscess post right lumbar microdiscectomy  Brief:   66 year old male with PMH of anxiety, asthma, depression, DM, GERD, Hyperlipidemia, hypothyroidism, COPD. Admitted 5/10 with a spinal epidural abscess after a routine lumbar laminectomy for a disc rupture one week prior. MRI revealed new L4-L5 abscess. Underwent IR aspiration on 5/11. Pain had been hard to control and patient was requiring multiple PRN doses of dilaudid. Later in the day on 5/11 patient was found unresponsive with oxygen saturation in the 40s. He was given Narcan and responded well. However, later in the day patient was found aphasic and altered, again responsive to narcan, Neurology consulted, Head CT negative. Patient transferred to ICU and placed on BiPAP, ABG 7.2/50/82. Patient with liver and renal failure with elevated ammonia level. Nephrology consulted. Overnight 5/14 patient SOB requiring lasix administration and ultimately intubation.   SUBJECTIVE:  Remains on full vent support and CRRT Agitated at times. Will pull at ETT when sedation decreased  VITAL SIGNS: BP (!) 148/56 (BP Location: Right Arm)   Pulse 72   Temp 97.9 F (36.6 C) (Oral)   Resp (!) 35   Ht 5\' 4"  (1.626 m)   Wt 226 lb 3.1 oz (102.6 kg)   SpO2 93%   BMI 38.83 kg/m   HEMODYNAMICS:    VENTILATOR SETTINGS: Vent Mode: PRVC FiO2 (%):  [50 %] 50 % Set Rate:  [35 bmp] 35 bmp Vt Set:  [480 mL] 480 mL PEEP:  [5 cmH20] 5 cmH20 Plateau Pressure:  [25 EHM09-47 cmH20] 30 cmH20  INTAKE / OUTPUT:  Intake/Output Summary (Last 24 hours) at 03/13/17 0911 Last data filed at 03/13/17 0900  Gross per 24 hour  Intake          2350.99 ml  Output             4426 ml  Net         -2075.01 ml     General appearance:  66 Year old  Male, obese.  Sedated. Chronically ill appearing Eyes: anicteric sclerae, moist conjunctivae; PERRL, EOMI bilaterally. Mouth:  membranes and no mucosal ulcerations; normal hard and soft palate, orally intubated Neck: Trachea midline; neck supple, no JVD Lungs/chest: scattered rhonchi, with normal respiratory effort and no intercostal retractions CV: RRR, no MRGs  Abdomen: Soft, non-tender; no masses or HSM Extremities: massive anasarca Skin: Normal temperature, turgor and texture; no rash, ulcers or subcutaneous nodules Neuro/Psych: sedated. No distress. Moves all extremities    LABS:  BMET  Recent Labs Lab 03/12/17 0321 03/12/17 1635 03/13/17 0317  NA 140  139 143 139  K 3.9  3.9 3.2* 3.8  CL 99*  99* 110 102  CO2 28  28 25 28   BUN 74*  76* 54* 55*  CREATININE 4.12*  4.08* 2.55* 2.24*  GLUCOSE 120*  120* 103* 107*    Electrolytes  Recent Labs Lab 03/11/17 0223 03/11/17 1533 03/12/17 0321 03/12/17 1635 03/13/17 0317  CALCIUM 8.3* 8.1* 8.4*  8.4* 7.3* 8.4*  MG 2.1  --  2.3  --  2.4  PHOS 6.2* 7.1* 5.4* 3.3  --     CBC  Recent Labs Lab 03/11/17 0223 03/12/17 0321 03/13/17 0317  WBC 10.8* 12.6* 16.2*  HGB 10.1* 10.8* 10.6*  HCT 30.0* 32.1* 32.2*  PLT  [address] [place] Lab 03/10/17 1144 03/12/17 0321 03/13/17 0317  APTT 34 68* 102*  INR 1.20  --   --     Sepsis Markers  Recent Labs Lab 03/06/17 2033 03/07/17 0048 03/07/17 1031 03/08/17 0322 03/09/17 0628  LATICACIDVEN 2.2* 1.7  --   --   --   PROCALCITON  --   --  38.31 33.36 20.21    ABG  Recent Labs Lab 03/11/17 1250 03/12/17 0930 03/13/17 0349  PHART 7.387 7.421 7.374  PCO2ART 44.2 43.7 49.7*  PO2ART 323* 66.8* 64.7*    Liver Enzymes  Recent Labs Lab 03/07/17 0505 03/08/17 0322  03/11/17 1533 03/12/17 0321 03/12/17 1635  AST 82* 67*  --   --  96*  --   ALT 108* 82*  --   --  67*  --   ALKPHOS 112 119  --   --  141*  --   BILITOT 1.1 0.8  --   --  0.7   --   ALBUMIN 2.6* 2.6*  < > 1.8* 2.2*  2.2* 1.8*  < > = values in this interval not displayed.  Cardiac Enzymes No results for input(s): TROPONINI, PROBNP in the last 168 hours.  Glucose  Recent Labs Lab 03/12/17 1130 03/12/17 1545 03/12/17 2019 03/13/17 0007 03/13/17 0355 03/13/17 0751  GLUCAP 79 79 91 81 101* 73    Imaging Dg Chest Port 1 View  Result Date: 03/13/2017 CLINICAL DATA:  ET tube EXAM: PORTABLE CHEST 1 VIEW COMPARISON:  03/12/2017 FINDINGS: Support devices are stable a. Cardiomegaly. Diffuse bilateral airspace disease again noted, not significantly changed. Possible small right effusion. IMPRESSION: Diffuse bilateral airspace disease is stable. Question small right effusion. Electronically Signed   By: Rolm Baptise M.D.   On: 03/13/2017 07:35    STUDIES:  MRI L-SPINE W/ & W/O 5/10: Status post right L4 laminectomy with fluid collection in the operative bed causing mass effect on the dorsal aspect of the thecal sac with worsened narrowing of thecal sac compared to the preoperative study. 2. Unchanged distortion of the right lateral recess at L5-S1 secondary to the presence of granulation tissue. PORT CXR 5/11:  Personally reviewed by me. Patchy opacification within right mid and lower lung zone. Full left hilum. No pleural effusion appreciated. CT HEAD/ANGIO NECK/HEAD 5/12: Negative for emergent large vessel occlusion. 2. Diminutive intracranial vessels and moderate distal basilar stenosis. 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 3. High-grade stenosis at the origin of the non dominant right vertebral artery. 4. No carotid stenosis in the neck. COMPLETE ABD U/S 5/13:  Surgical absence of the gallbladder. No bile duct dilatation. Severe fatty infiltration of the liver. Minimal perinephric fluid noted along the lower pole of the left kidney, nonspecific. EEG 5/14 > Moderate diffuse slowing of the background, focal slowing over the right frontal region    CULTURES: MRSA PCR 4/30:  Negative  Blood Cultures x2 5/10 >>>neg Urine Culture 5/10:  Negative  HIV 5/11:  Nonreactive  L-4 Laminectomy Bed Culture 5/11 > MDR Staph epidermidis (sens to [place], [place], tetracycline Blood Cultures x2 5/12 >>>neg  Stool C diff PCR 5/12:  Negative   ANTIBIOTICS: Rocephin 5/11 >>>5/15 Vancomycin 5/11 >>> dapto 5/11>>>  SIGNIFICANT EVENTS: 5/2 > L4-5 microdiscectomy  5/10> Admitted  5/11 > IR aspiration, respiratory distress responded to narcan, another event later in day did not respond, code stroke called > negative  5/12 > Solu-Cortef 100 q8h (Patient on  Prednisone 5 mg outpatient)  5/13 > Solu-Cortef decreased to basal dosing  5/15 > Intubated  5/17 > start CRRT 5/19  LINES/TUBES: ETT 5/15 >>  Right IJ HD: 5/18>>  ASSESSMENT / PLAN:  PULMONARY A: Acute on Chronic Hypercarbic respiratory Failure in sitting of pulmonary edema with refractory volume overload H/O COPD  -PCXR personally reviewed 5/19: support lines & tubes are unremarkable. Patchy bilateral infiltrates persist  P:   Full vent support PAD protocol F/u am cxr 5/20 Negative volume goal per nephrology   CARDIOVASCULAR A:  HTN P:  Cont tele PRN labetolol for SBP >170  RENAL A:   Acute renal failure (Bas Cr 1.1): d/t hypotension, ATN, contrast and NSAIDs +/- vanc Non anion gap metabolic acidosis  +Volume Overload  - 2 liters yesterday. Making urine.  -crrt started 5/17 P:   Removal goal increased to 200/hr Cont CRRT F/u chem in am and per renal protocol   GASTROINTESTINAL A:   Hepatic Failure  Elevated Ammonia  Protein calorie malnutrition P:   Trend LFTs Cont tubefeeds  HEMATOLOGIC A:   Thrombocytopenia-->resolved.  Normocytic anemia P:  Trend CBC  [place] heparin for VTE prophylaxis  INFECTIOUS A:   MRSE Epidural Abscess s/p discectomy on 5/2, IR on 5/11 for aspiration P:   Cont  dapto per ID. (planning on treatment course of 6-8  weeks)  ENDOCRINE A:   Hypopituitarism  Hypothyroid    P:   Cont basal solucortef Cont synthroid   NEUROLOGIC A:   Acute Metabolic Encephalopathy in setting of uremia and elevated ammonia  EEG with diffuse slowing, non-specific - P:   RAS goal 0 to -1 PAD protocol-->no change in fent for now Holding prozac and cybalta  Cont lactulose   FAMILY -->sister updated.   Discussion Remains massively volume overloaded. Agitated at times. Think volume & encephalopathy are the major barriers to weaning. For today: net neg goal increased, no change in PAD goals, abx per ID.   My ccm time 35 minutes.   Erick Colace ACNP-BC [place] Pager # [phone] OR # [phone] if no answer

## 2017-03-13 NOTE — Progress Notes (Signed)
eLink Physician-Brief Progress Note Patient Name: CHOU BUSLER DOB: Jan 29, 1951 MRN: [id number]   Date of Service  03/13/2017  HPI/Events of Note  Hypotension - BP = 78/61. Propofol IV infusion now held.   eICU Interventions  Will order: 1. 0.9 NaCl 1 liter IV over 1 hour now. 2. Norepinephrine IV infusion. Titrate to MAP > 65. 3. CRRT - will adjust for even fluid pull until BP improved.      Intervention Category Major Interventions: Hypotension - evaluation and management  Nadiyah Zeis [place] 03/13/2017, 5:46 PM

## 2017-03-13 NOTE — Progress Notes (Signed)
Subjective:  CRRT continues - still making urine 917 last 24 hours- lasix.  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 He's also has developed acute kidney injury in the setting of hypotension/contrasted study/NSAIDs/supratherapeutic vanc level  1. Renal- baseline creatinine good at 1.1. Starting on 5/12- began to develop AKI thought to be due to hypotension/ATN/contrasted study/NSAIDS and supratherapuetic vanc level, and numbers have worsened daily.  Previously oliguric but now making better urine with the assistance of lasix.  CRRT started 5/17- I wanted CRRT because lots of fluid to remove and soft BP.   Hopeful for eventual recovery given baseline creatinine.  He would be short term HD candidate but I do not think would be long term HD candidate due to cognitive deficits described by the sister.  2. HTN/vol- is overloaded- Fio2 50% on vent. Is 2.4 liters negative with UOP and CRRT- continue with volume removal-will increase to 200 per hour 3. Anemia- not a significant issue at this time- hgb over 10 4. Metabolic acidosis- improved some on low dose IV bicarb- will stop extra bicarb 5. Epidural abscess- on vanc- last level 18- to start dapto per ID 6. Pan hypopit- on steroids and synthroid- also history of DI but since UOP is  not heavy does not need treatment for that at this time 7. VDRF- mostly due to volume but also some MS- per CCM   Tavie Haseman A    Labs: Basic Metabolic Panel:  Recent Labs Lab 03/11/17 1533 03/12/17 0321 03/12/17 1635 03/13/17 0317  NA 141 140  139 143 139  K 3.7 3.9  3.9 3.2* 3.8  CL 101 99*  99* 110 102  CO2 _0 GLUCOSE 127* 120*  120* 103* 107*  BUN 112* 74*  76* 54* 55*  CREATININE 6.65* 4.12*  4.08* 2.55* 2.24*  CALCIUM 8.1* 8.4*  8.4* 7.3* 8.4*  PHOS 7.1* 5.4* 3.3  --    Liver Function Tests:  Recent Labs Lab 03/07/17 0505 03/08/17 0322  03/11/17 1533 03/12/17 0321 03/12/17 1635  AST 82* 67*  --   --  96*  --   ALT 108* 82*  --   --  67*  --   ALKPHOS 112 119  --   --  141*  --   BILITOT 1.1 0.8  --   --  0.7  --   PROT 5.4* 5.5*  --   --  5.8*  --   ALBUMIN 2.6* 2.6*  < > 1.8* 2.2*  2.2* 1.8*  < > = values in this interval not displayed. No results for input(s): LIPASE, AMYLASE in the last 168 hours.  Recent Labs Lab 03/06/17 2033 03/07/17 1031 03/10/17 0949  AMMONIA 66* 26 40*   CBC:  Recent Labs Lab 03/06/17 2258  03/08/17 0322 03/09/17 0628 03/10/17 0433  03/11/17 0223 03/12/17 0321 03/13/17 0317  WBC 16.1*  < > 15.2* 16.2* 12.4*  --  10.8* 12.6* 16.2*  NEUTROABS 15.1*  --  14.7*  --   --   --   --   --   --   HGB 12.7*  < > 11.7* 12.2* 10.5*  < > 10.1* 10.8* 10.6*  HCT 37.3*  < > 33.7* 35.7* 31.7*  < > 30.0* 32.1* 32.2*  MCV 91.0  < > 88.9 90.6 90.1  --  88.5 88.7 90.7  PLT 134*  < > 133* 118* 121*  < > 151 177 206  < > = values in this interval not displayed. Cardiac Enzymes:  Recent Labs Lab 03/13/17 0317  CKTOTAL 240   CBG:  Recent Labs Lab 03/12/17 1130 03/12/17 1545 03/12/17 2019 03/13/17 0007 03/13/17 0355  GLUCAP 79 79 91 81 101*    Iron Studies: No results for input(s): IRON, TIBC, TRANSFERRIN, FERRITIN in the last 72 hours. Studies/Results: Dg Chest Port 1 View  Result Date:  03/12/2017 CLINICAL DATA:  Intubation . EXAM: PORTABLE CHEST 1 VIEW COMPARISON:  03/11/2017 . FINDINGS: Endotracheal tube, NG tube, right IJ line stable position. Heart size normal. Diffuse bilateral pulmonary infiltrates/edema. No change from prior exam. Small bilateral pleural effusions. No pneumothorax . IMPRESSION: 1. Lines and tubes in stable position. 2. Diffuse bilateral pulmonary infiltrates/edema. No change from prior exam. Electronically Signed   By: Marcello Moores  Register   On: 03/12/2017 07:03   Dg Chest Port 1 View  Result Date: 03/11/2017 CLINICAL DATA:  Encounter for central line placement. EXAM: PORTABLE CHEST 1 VIEW COMPARISON:  03/11/2017 FINDINGS: The ET tube tip is above the carina. Right IJ catheter tip is in the projection of the proximal SVC. No pneumothorax visualized. Nasogastric tube tip is below the GE junction. Mild cardiac enlargement. Diffuse bilateral pulmonary opacities are unchanged compared with previous exam. IMPRESSION: 1. Right IJ catheter placement with tip in the projection of the SVC. No pneumothorax identified. 2. No change in ARDS pattern. Electronically Signed   By: Kerby Moors M.D.   On: 03/11/2017 14:00   Medications: Infusions: . sodium chloride 10 mL/hr at 03/12/17 1020  . DAPTOmycin (CUBICIN)  IV Stopped (03/12/17 1311)  . famotidine (PEPCID) IV Stopped (03/12/17 1033)  . feeding supplement (VITAL HIGH PROTEIN) 1,000 mL (03/13/17 0100)  . fentaNYL infusion INTRAVENOUS 400 mcg/hr (03/12/17 2322)  . furosemide Stopped (03/13/17 0656)  . heparin 10,000 units/ 20 mL infusion syringe 1,000 Units/hr (03/12/17 2115)  . heparin    . dialysis replacement fluid (prismasate) 300 mL/hr at 03/13/17 0103  . dialysis replacement fluid (prismasate) 350 mL/hr at 03/12/17 2025  . dialysate (PRISMASATE) 1,500 mL/hr at 03/13/17 0424    Scheduled Medications: . chlorhexidine gluconate (MEDLINE KIT)  15 mL Mouth Rinse BID  . fentaNYL (SUBLIMAZE) injection  50 mcg  Intravenous Once  . heparin subcutaneous  5,000 Units Subcutaneous Q8H  . hydrocortisone sod succinate (SOLU-CORTEF) inj  25 mg Intravenous Q12H  . lactulose  30 g Per Tube BID  . levothyroxine  75 mcg Intravenous Daily  . mouth rinse  15 mL Mouth Rinse QID  . thiamine  injection  100 mg Intravenous Daily    have reviewed scheduled and prn medications.  Physical Exam: General:sedated, intubated Heart:RRR Lungs: mostly clear Abdomen: distended , non tender Extremities: pitting edema Right IJ vascath placed 5/17    03/13/2017,7:33 AM  LOS: 9 days

## 2017-03-13 NOTE — Progress Notes (Signed)
MD Oletta Darter called to make aware that patient has become hypotensive (SBP in 50-60's) despite turning off propofol drip and decreasing fentanyl drip. New orders received. Will monitor  Lorre Munroe

## 2017-03-14 ENCOUNTER — Inpatient Hospital Stay (HOSPITAL_COMMUNITY): Payer: Medicare Other

## 2017-03-14 LAB — GLUCOSE, CAPILLARY
Glucose-Capillary: 102 mg/dL — ABNORMAL HIGH (ref 65–99)
Glucose-Capillary: 104 mg/dL — ABNORMAL HIGH (ref 65–99)
Glucose-Capillary: 85 mg/dL (ref 65–99)
Glucose-Capillary: 86 mg/dL (ref 65–99)
Glucose-Capillary: 97 mg/dL (ref 65–99)
Glucose-Capillary: 99 mg/dL (ref 65–99)

## 2017-03-14 LAB — CBC
HCT: 34.8 % — ABNORMAL LOW (ref 39.0–52.0)
Hemoglobin: 11.3 g/dL — ABNORMAL LOW (ref 13.0–17.0)
MCH: 29.9 pg (ref 26.0–34.0)
MCHC: 32.5 g/dL (ref 30.0–36.0)
MCV: 92.1 fL (ref 78.0–100.0)
PLATELETS: 231 10*3/uL (ref 150–400)
RBC: 3.78 MIL/uL — AB (ref 4.22–5.81)
RDW: 15 % (ref 11.5–15.5)
WBC: 16.7 10*3/uL — AB (ref 4.0–10.5)

## 2017-03-14 LAB — RENAL FUNCTION PANEL
ANION GAP: 10 (ref 5–15)
ANION GAP: 8 (ref 5–15)
Albumin: 2.3 g/dL — ABNORMAL LOW (ref 3.5–5.0)
Albumin: 2.6 g/dL — ABNORMAL LOW (ref 3.5–5.0)
BUN: 42 mg/dL — ABNORMAL HIGH (ref 6–20)
BUN: 41 mg/dL — ABNORMAL HIGH (ref 6–20)
CALCIUM: 8.7 mg/dL — AB (ref 8.9–10.3)
CHLORIDE: 103 mmol/L (ref 101–111)
CO2: 27 mmol/L (ref 22–32)
CO2: 25 mmol/L (ref 22–32)
Calcium: 8.8 mg/dL — ABNORMAL LOW (ref 8.9–10.3)
Chloride: 101 mmol/L (ref 101–111)
Creatinine, Ser: 1.48 mg/dL — ABNORMAL HIGH (ref 0.61–1.24)
Creatinine, Ser: 1.55 mg/dL — ABNORMAL HIGH (ref 0.61–1.24)
GFR calc non Af Amer: 48 mL/min — ABNORMAL LOW (ref >60)
GFR calc non Af Amer: 46 mL/min — ABNORMAL LOW (ref >60)
GFR, EST AFRICAN AMERICAN: 56 mL/min — AB (ref >60)
GFR, EST AFRICAN AMERICAN: 53 mL/min — AB (ref >60)
Glucose, Bld: 113 mg/dL — ABNORMAL HIGH (ref 65–99)
Glucose, Bld: 130 mg/dL — ABNORMAL HIGH (ref 65–99)
PHOSPHORUS: 3.3 mg/dL (ref 2.5–4.6)
POTASSIUM: 4 mmol/L (ref 3.5–5.1)
Phosphorus: 4.1 mg/dL (ref 2.5–4.6)
Potassium: 3.9 mmol/L (ref 3.5–5.1)
SODIUM: 138 mmol/L (ref 135–145)
Sodium: 136 mmol/L (ref 135–145)

## 2017-03-14 LAB — POCT ACTIVATED CLOTTING TIME
ACTIVATED CLOTTING TIME: 175 s
ACTIVATED CLOTTING TIME: 213 s
ACTIVATED CLOTTING TIME: 208 s
ACTIVATED CLOTTING TIME: 219 s
ACTIVATED CLOTTING TIME: 219 s
ACTIVATED CLOTTING TIME: 224 s
ACTIVATED CLOTTING TIME: 224 s
ACTIVATED CLOTTING TIME: 219 s
ACTIVATED CLOTTING TIME: 208 s
ACTIVATED CLOTTING TIME: 219 s
ACTIVATED CLOTTING TIME: 213 s
Activated Clotting Time: 235 seconds
Activated Clotting Time: 235 seconds

## 2017-03-14 LAB — APTT: APTT: 114 s — AB (ref 24–36)

## 2017-03-14 LAB — MAGNESIUM: Magnesium: 2.5 mg/dL — ABNORMAL HIGH (ref 1.7–2.4)

## 2017-03-14 MED ORDER — NOREPINEPHRINE BITARTRATE 1 MG/ML IV SOLN: 0.0000 ug/min | INTRAVENOUS | Status: DC

## 2017-03-14 MED ORDER — HYDROCORTISONE 5 MG PO TABS
25.0000 mg | ORAL_TABLET | Freq: Two times a day (BID) | ORAL | Status: DC
  Administered 2017-03-14 (×2): 25 mg
  Filled 2017-03-14 (×3): qty 1

## 2017-03-14 MED ORDER — PANTOPRAZOLE SODIUM 40 MG PO PACK
40.0000 mg | PACK | Freq: Every day | ORAL | Status: DC
  Administered 2017-03-14: 40 mg
  Filled 2017-03-14 (×2): qty 20

## 2017-03-14 MED ORDER — NOREPINEPHRINE BITARTRATE 1 MG/ML IV SOLN
0.0000 ug/min | INTRAVENOUS | Status: DC
  Administered 2017-03-14: 3 ug/min via INTRAVENOUS
  Administered 2017-03-15: 40 ug/min via INTRAVENOUS
  Filled 2017-03-14 (×2): qty 16

## 2017-03-14 MED ORDER — ATROPINE SULFATE 1 MG/10ML IJ SOSY
PREFILLED_SYRINGE | INTRAMUSCULAR | Status: AC
  Filled 2017-03-14: qty 10

## 2017-03-14 MED ORDER — SODIUM CHLORIDE 0.9 % IV SOLN
0.0000 ug/kg/h | INTRAVENOUS | Status: DC
  Administered 2017-03-14: 0.4 ug/kg/h via INTRAVENOUS
  Administered 2017-03-14: 0.2 ug/kg/h via INTRAVENOUS
  Filled 2017-03-14 (×3): qty 2

## 2017-03-14 MED ORDER — QUETIAPINE FUMARATE 50 MG PO TABS
50.0000 mg | ORAL_TABLET | Freq: Two times a day (BID) | ORAL | Status: DC
  Administered 2017-03-14: 50 mg
  Filled 2017-03-14 (×2): qty 1

## 2017-03-14 MED ORDER — MIDAZOLAM HCL 2 MG/2ML IJ SOLN
1.0000 mg | INTRAMUSCULAR | Status: DC | PRN
  Administered 2017-03-14 – 2017-03-15 (×3): 2 mg via INTRAVENOUS
  Filled 2017-03-14 (×3): qty 2

## 2017-03-14 MED ORDER — QUETIAPINE FUMARATE 50 MG PO TABS: 50.0000 mg | ORAL_TABLET | Freq: Every day | ORAL | Status: DC

## 2017-03-14 MED ORDER — HYDROCORTISONE 5 MG/ML ORAL SUSPENSION
25.0000 mg | Freq: Two times a day (BID) | ORAL | Status: DC
  Filled 2017-03-14: qty 5

## 2017-03-14 MED ORDER — FLUOXETINE HCL 20 MG/5ML PO SOLN
40.0000 mg | Freq: Every day | ORAL | Status: DC
  Administered 2017-03-14: 40 mg
  Filled 2017-03-14 (×2): qty 10

## 2017-03-14 NOTE — Progress Notes (Addendum)
Chaplain did not receive page immediately due to pager and communication issues.  Upon discovering this unit's attempt to reach Spiritual Care, chaplain reported to unit.  RN Caryl Pina provided helpful background to situation as well as contact information for the sister, [name] Schultz (note that spelling in family contacts is incorrect),  and sister's friend, Jan.  Chaplain offered prayer bedside with the patient, who is incommunicative, and reached out to sister and friend via provided telephone numbers.  After some time, chaplain was able to connect with sister, who was very appreciative of the efforts toward outreach and did not at all seem perturbed by the delay.  Via telephone, chaplain provided empathy and emotional support, and affirmed sister's strong sense of God's presence.  Sister requested prayer by a Catholic priest tomorrow and stated that she will be at the hospital by 8 AM.  RN believes that patient is likely to survive the night. Chaplain left messages for priest on call (from [facility]) requesting a visit in the morning after 8 AM.  Will recommend that the unit chaplain follow up with the sister and RN in the morning and check on the status of visit by priest.   Note that the regular chaplain pager is not working properly.  Note temporary pager number below, and check [email or web address] under "Spiritual Care" for updated pager information.  Please call as needed or requested.  Minus Liberty, [place] (temporary pager)     03/14/17 1800  Clinical Encounter Type  Visited With Patient  Visit Type Initial  Referral From Family  Consult/Referral To Chaplain  Stress Factors  Patient Stress Factors (Recently made DNR)  Family Stress Factors Family relationships;Major life changes

## 2017-03-14 NOTE — Progress Notes (Signed)
[place] Progress Note Patient Name: Lawrence Moore DOB: 01/22/1951 MRN: [id number]   Date of Service  03/14/2017  HPI/Events of Note  Patient is currently on Norepinephrine IV infusion for hemodynamic support. No current order for Norepinephrine IV infusion.   eICU Interventions  Will order: 1. Norepinephrine IV infusion. Titrate to MAP >= 65.      Intervention Category Major Interventions: Hypotension - evaluation and management  Sommer,Steven Cornelia Copa 03/14/2017, 8:46 PM

## 2017-03-14 NOTE — Progress Notes (Signed)
Intermittent bradycardia. Symptomatic  Had him on precedex but seems to be vagal stimulation when he bites on tube  Plan Dc precedex Cont levophed for MAP >65 Will likely need trach Discussed plan of care w/ family. He is now DNR should she arrest but they are still hopeful that he will improve.   Erick Colace ACNP-BC [place] Pager # [phone] OR # [phone] if no answer

## 2017-03-14 NOTE — Progress Notes (Signed)
Subjective:  CRRT continues - still making urine 919 last 24 hours- lasix.  Minus almost 4  last 24 hours- did require some transient pressor support last night - attempt at wean this AM did not go well Objective Vital signs in last 24 hours: Vitals:   03/14/17 0615 03/14/17 0630 03/14/17 0645 03/14/17 0700  BP: 125/68 121/67 122/65 125/65  Pulse:      Resp: (!) 35 (!) 35 (!) 35 (!) 35  Temp:      TempSrc:      SpO2: 97% 98% 99%   Weight:      Height:       Weight change: -4.9 kg (-10 lb 12.8 oz)  Intake/Output Summary (Last 24 hours) at 03/14/17 0747 Last data filed at 03/14/17 0700  Gross per 24 hour  Intake          2856.75 ml  Output             6771 ml  Net         -3914.25 ml    Assessment/Plan  66 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 He's also has developed acute kidney injury in the setting of hypotension/contrasted study/NSAIDs/supratherapeutic vanc level  1. Renal- baseline creatinine good at 1.1. Starting on 5/12- began to develop AKI thought to be due to hypotension/ATN/contrasted study/NSAIDS and supratherapuetic vanc level, and numbers have worsened daily.  Previously oliguric but now making better urine with the assistance of lasix.  CRRT started 5/17- I wanted CRRT because lots of fluid to remove and soft BP.   Hopeful for eventual recovery given baseline creatinine.  He would be short term HD candidate but I do not think would be long term HD candidate due to cognitive deficits described by the sister. Since was off pressors I thought could transition to IHD BUT now with transient pressors and need for different sedation that will cause low BP will continue CRRT  2. HTN/vol- is overloaded- Fio2 40% on vent. Is almost 4 liters negative with UOP and CRRT- continue with volume removal-based on BP per nursing 3. Anemia- not a significant issue at this time- hgb over 10 4. Metabolic  acidosis- resolved 5. Epidural abscess- on vanc- last level 18- to start dapto per ID 6. Pan hypopit- on steroids and synthroid- also history of DI but since UOP is not heavy does not need treatment for that at this time 7. VDRF- mostly due to volume but also some MS- per CCM- have been able to wean FI02   Lawrence Moore A    Labs: Basic Metabolic Panel:  Recent Labs Lab 03/12/17 1635 03/13/17 0317 03/13/17 1517 03/14/17 0402  NA 143 139 139 138  K 3.2* 3.8 4.1 4.0  CL 110 102 104 101  CO2 _0 GLUCOSE 103* 107* 118* 113*  BUN 54* 55* 48* 42*  CREATININE 2.55* 2.24* 1.84* 1.48*  CALCIUM 7.3* 8.4* 8.6* 8.7*  PHOS 3.3  --  3.2 3.3   Liver Function Tests:  Recent Labs Lab 03/08/17 0322  03/12/17 0321 03/12/17 1635 03/13/17 1517 03/14/17 0402  AST 67*  --  96*  --   --   --   ALT 82*  --  67*  --   --   --   ALKPHOS 119  --  141*  --   --   --   BILITOT 0.8  --  0.7  --   --   --  PROT 5.5*  --  5.8*  --   --   --   ALBUMIN 2.6*  < > 2.2*  2.2* 1.8* 2.2* 2.3*  < > = values in this interval not displayed. No results for input(s): LIPASE, AMYLASE in the last 168 hours.  Recent Labs Lab 03/07/17 1031 03/10/17 0949  AMMONIA 26 40*   CBC:  Recent Labs Lab 03/08/17 0322  03/10/17 0433  03/11/17 0223 03/12/17 0321 03/13/17 0317 03/14/17 0402  WBC 15.2*  < > 12.4*  --  10.8* 12.6* 16.2* 16.7*  NEUTROABS 14.7*  --   --   --   --   --   --   --   HGB 11.7*  < > 10.5*  < > 10.1* 10.8* 10.6* 11.3*  HCT 33.7*  < > 31.7*  < > 30.0* 32.1* 32.2* 34.8*  MCV 88.9  < > 90.1  --  88.5 88.7 90.7 92.1  PLT 133*  < > 121*  < > 151 177 206 231  < > = values in this interval not displayed. Cardiac Enzymes:  Recent Labs Lab 03/13/17 0317  CKTOTAL 240   CBG:  Recent Labs Lab 03/13/17 1146 03/13/17 1606 03/13/17 1937 03/13/17 2337 03/14/17 0442  GLUCAP 101* 83 102* 93 104*    Iron Studies: No results for input(s): IRON, TIBC, TRANSFERRIN,  FERRITIN in the last 72 hours. Studies/Results: Dg Chest Port 1 View  Result Date: 03/14/2017 CLINICAL DATA:  Central line, ET tube in place EXAM: PORTABLE CHEST 1 VIEW COMPARISON:  03/13/2017 FINDINGS: There has been improvement in lung aeration since the previous day's study. Central and lung base interstitial and hazy airspace opacities have improved. Residual opacity is most evident in the lung bases, right greater than left, which may reflect atelectasis. There are no new lung abnormalities. No pneumothorax. Endotracheal tube, right internal jugular central venous line and nasal/orogastric tube are stable. IMPRESSION: 1. Improved lung aeration when compared to the previous day's study. No new abnormalities. 2. Support apparatus is stable and well positioned. Electronically Signed   By: Lajean Manes M.D.   On: 03/14/2017 07:28   Dg Chest Port 1 View  Result Date: 03/13/2017 CLINICAL DATA:  ET tube EXAM: PORTABLE CHEST 1 VIEW COMPARISON:  03/12/2017 FINDINGS: Support devices are stable a. Cardiomegaly. Diffuse bilateral airspace disease again noted, not significantly changed. Possible small right effusion. IMPRESSION: Diffuse bilateral airspace disease is stable. Question small right effusion. Electronically Signed   By: Rolm Baptise M.D.   On: 03/13/2017 07:35   Medications: Infusions: . sodium chloride 10 mL/hr at 03/12/17 1020  . DAPTOmycin (CUBICIN)  IV Stopped (03/12/17 1311)  . feeding supplement (VITAL HIGH PROTEIN) 1,000 mL (03/13/17 1941)  . fentaNYL infusion INTRAVENOUS 350 mcg/hr (03/13/17 2103)  . furosemide Stopped (03/14/17 0657)  . heparin 10,000 units/ 20 mL infusion syringe 1,100 Units/hr (03/14/17 0405)  . heparin    . norepinephrine (LEVOPHED) Adult infusion Stopped (03/14/17 0002)  . dialysis replacement fluid (prismasate) 300 mL/hr at 03/13/17 1805  . dialysis replacement fluid (prismasate) 350 mL/hr at 03/14/17 0141  . dialysate (PRISMASATE) 1,500 mL/hr at 03/14/17  0739  . propofol (DIPRIVAN) infusion Stopped (03/13/17 1734)    Scheduled Medications: . chlorhexidine gluconate (MEDLINE KIT)  15 mL Mouth Rinse BID  . famotidine  20 mg Per Tube Daily  . fentaNYL (SUBLIMAZE) injection  50 mcg Intravenous Once  . heparin subcutaneous  5,000 Units Subcutaneous Q8H  . hydrocortisone sod succinate (SOLU-CORTEF) inj  25 mg Intravenous Q12H  . lactulose  30 g Per Tube BID  . levothyroxine  75 mcg Intravenous Daily  . mouth rinse  15 mL Mouth Rinse QID  . thiamine injection  100 mg Intravenous Daily    have reviewed scheduled and prn medications.  Physical Exam: General:sedated, intubated Heart:RRR Lungs: mostly clear Abdomen: distended , non tender Extremities: pitting edema Right IJ vascath placed 5/17    03/14/2017,7:47 AM  LOS: 10 days

## 2017-03-14 NOTE — Progress Notes (Signed)
Attempted vent weaning per CCM request.  Pt became bradycardic HR 20's, desat 80%.  Pt placed back on full vent support.

## 2017-03-14 NOTE — Progress Notes (Signed)
PULMONARY / CRITICAL CARE MEDICINE   Name: Lawrence Moore MRN: [id number] DOB: [birth year]    ADMISSION DATE:  03/10/2017 CONSULTATION DATE:  03/08/2017  REFERRING MD:  Dr. Kendrick Fries   CHIEF COMPLAINT:  Spinal abscess post right lumbar microdiscectomy  Brief:   66 year old male with PMH of anxiety, asthma, depression, DM, GERD, Hyperlipidemia, hypothyroidism, COPD. Admitted 5/10 with a spinal epidural abscess after a routine lumbar laminectomy for a disc rupture one week prior. MRI revealed new L4-L5 abscess. Underwent IR aspiration on 5/11. Pain had been hard to control and patient was requiring multiple PRN doses of dilaudid. Later in the day on 5/11 patient was found unresponsive with oxygen saturation in the 40s. He was given Narcan and responded well. However, later in the day patient was found aphasic and altered, again responsive to narcan, Neurology consulted, Head CT negative. Patient transferred to ICU and placed on BiPAP, ABG 7.2/50/82. Patient with liver and renal failure with elevated ammonia level. Nephrology consulted. Overnight 5/14 patient SOB requiring lasix administration and ultimately intubation.   SUBJECTIVE:  No distress. Still sedated. Volume status -3 liters. Hypotensive last night. Now off pressors   VITAL SIGNS: BP 125/65   Pulse 74   Temp 98.1 F (36.7 C) (Oral)   Resp (!) 35   Ht 5\' 4"  (1.626 m)   Wt 215 lb 6.2 oz (97.7 kg)   SpO2 99%   BMI 36.97 kg/m   HEMODYNAMICS:    VENTILATOR SETTINGS: Vent Mode: PRVC FiO2 (%):  [40 %-50 %] 40 % Set Rate:  [35 bmp] 35 bmp Vt Set:  [480 mL] 480 mL PEEP:  [5 cmH20] 5 cmH20 Plateau Pressure:  [27 cmH20-29 cmH20] 28 cmH20  INTAKE / OUTPUT:  Intake/Output Summary (Last 24 hours) at 03/14/17 0749 Last data filed at 03/14/17 0700  Gross per 24 hour  Intake          2856.75 ml  Output             6771 ml  Net         -3914.25 ml   General appearance:  66 Year old  Male, chronically ill, sedated on vent  NAD,  does not follow commands Eyes: anicteric sclerae, moist conjunctivae; PERRL, EOMI bilaterally. Mouth:  membranes and no mucosal ulcerations; normal hard and soft palate, orally intubated Neck: Trachea midline; neck supple, no JVD, right IJ HD cath unremarkable  Lungs/chest: scattered rhonchi, with normal respiratory effort and no intercostal retractions, did not trigger vent when placed on SBT  CV: RRR, no MRGs, bradycardia briefly during SBT attempt Abdomen: Soft, non-tender; no masses or HSM Extremities: peripheral edema improved some Skin: Normal temperature, turgor and texture; no rash, ulcers or subcutaneous nodules Neuro/Psych: moves all extremities. No focal def. Confused. Does not f/c LABS:  BMET  Recent Labs Lab 03/13/17 0317 03/13/17 1517 03/14/17 0402  NA 139 139 138  K 3.8 4.1 4.0  CL 102 104 101  CO2 28 26 27   BUN 55* 48* 42*  CREATININE 2.24* 1.84* 1.48*  GLUCOSE 107* 118* 113*    Electrolytes  Recent Labs Lab 03/12/17 0321 03/12/17 1635 03/13/17 0317 03/13/17 1517 03/14/17 0402  CALCIUM 8.4*  8.4* 7.3* 8.4* 8.6* 8.7*  MG 2.3  --  2.4  --  2.5*  PHOS 5.4* 3.3  --  3.2 3.3    CBC  Recent Labs Lab 03/12/17 0321 03/13/17 0317 03/14/17 0402  WBC 12.6* 16.2* 16.7*  HGB 10.8* 10.6* 11.3*  HCT 32.1* 32.2* 34.8*  PLT 177 206 231    Coag's  Recent Labs Lab 03/10/17 1144 03/12/17 0321 03/13/17 0317 03/14/17 0402  APTT 34 68* 102* 114*  INR 1.20  --   --   --     Sepsis Markers  Recent Labs Lab 03/07/17 1031 03/08/17 0322 03/09/17 0628  PROCALCITON 38.31 33.36 20.21    ABG  Recent Labs Lab 03/11/17 1250 03/12/17 0930 03/13/17 0349  PHART 7.387 7.421 7.374  PCO2ART 44.2 43.7 49.7*  PO2ART 323* 66.8* 64.7*    Liver Enzymes  Recent Labs Lab 03/08/17 0322  03/12/17 0321 03/12/17 1635 03/13/17 1517 03/14/17 0402  AST 67*  --  96*  --   --   --   ALT 82*  --  67*  --   --   --   ALKPHOS 119  --  141*  --   --   --    BILITOT 0.8  --  0.7  --   --   --   ALBUMIN 2.6*  < > 2.2*  2.2* 1.8* 2.2* 2.3*  < > = values in this interval not displayed.  Cardiac Enzymes No results for input(s): TROPONINI, PROBNP in the last 168 hours.  Glucose  Recent Labs Lab 03/13/17 1143 03/13/17 1146 03/13/17 1606 03/13/17 1937 03/13/17 2337 03/14/17 0442  GLUCAP 66 101* 83 102* 93 104*    Imaging Dg Chest Port 1 View  Result Date: 03/14/2017 CLINICAL DATA:  Central line, ET tube in place EXAM: PORTABLE CHEST 1 VIEW COMPARISON:  03/13/2017 FINDINGS: There has been improvement in lung aeration since the previous day's study. Central and lung base interstitial and hazy airspace opacities have improved. Residual opacity is most evident in the lung bases, right greater than left, which may reflect atelectasis. There are no new lung abnormalities. No pneumothorax. Endotracheal tube, right internal jugular central venous line and nasal/orogastric tube are stable. IMPRESSION: 1. Improved lung aeration when compared to the previous day's study. No new abnormalities. 2. Support apparatus is stable and well positioned. Electronically Signed   By: Lajean Manes M.D.   On: 03/14/2017 07:28    STUDIES:  MRI L-SPINE W/ & W/O 5/10: Status post right L4 laminectomy with fluid collection in the operative bed causing mass effect on the dorsal aspect of the thecal sac with worsened narrowing of thecal sac compared to the preoperative study. 2. Unchanged distortion of the right lateral recess at L5-S1 secondary to the presence of granulation tissue. PORT CXR 5/11:  Personally reviewed by me. Patchy opacification within right mid and lower lung zone. Full left hilum. No pleural effusion appreciated. CT HEAD/ANGIO NECK/HEAD 5/12: Negative for emergent large vessel occlusion. 2. Diminutive intracranial vessels and moderate distal basilar stenosis. 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 3. High-grade stenosis at the  origin of the non dominant right vertebral artery. 4. No carotid stenosis in the neck. COMPLETE ABD U/S 5/13:  Surgical absence of the gallbladder. No bile duct dilatation. Severe fatty infiltration of the liver. Minimal perinephric fluid noted along the lower pole of the left kidney, nonspecific. EEG 5/14 > Moderate diffuse slowing of the background, focal slowing over the right frontal region   CULTURES: MRSA PCR 4/30:  Negative  Blood Cultures x2 5/10 >>>neg Urine Culture 5/10:  Negative  HIV 5/11:  Nonreactive  L-4 Laminectomy Bed Culture 5/11 > MDR Staph epidermidis (sens to [place], [place], tetracycline Blood Cultures x2 5/12 >>>neg  Stool C diff PCR  5/12:  Negative   ANTIBIOTICS: Rocephin 5/11 >>>5/15 Vancomycin 5/11 >>>5/11 dapto 5/11>>>  SIGNIFICANT EVENTS: 5/2 > L4-5 microdiscectomy  5/10> Admitted  5/11 > IR aspiration, respiratory distress responded to narcan, another event later in day did not respond, code stroke called > negative  5/12 > Solu-Cortef 100 q8h (Patient on Prednisone 5 mg outpatient)  5/13 > Solu-Cortef decreased to basal dosing  5/15 > Intubated  5/17 > start CRRT 5/19 > hypotensive. Required vasopressor support.  5/20 off pressors. ? R/t diprivan ?? Starting precedex. Supporting w/ neo. Attempting to cut fent back.  LINES/TUBES: ETT 5/15 >>  Right IJ HD: 5/18>>  ASSESSMENT / PLAN:  PULMONARY A: Acute on Chronic Hypercarbic respiratory Failure in sitting of pulmonary edema with refractory volume overload H/O COPD  -PCXR: personally reviewed 5/20-> aeration improved w/ decreasing edema -failed SBT w/ episode of apnea and associated bradycardia P:   Cont full vent support for now but re-attempt PSV when RAS will support Change sedation to precedex w/ goal RAS 0 to -1 Cont volume removal w/ CRRT Suspect that delirium and baseline cognitive fxn will be a challenge.   CARDIOVASCULAR A:  HTN Hypotension (transient 5/19) Episodic bradycardia P:   Cont tele Neo IF needed to support transition to precedex.   RENAL A:   Acute renal failure (Bas Cr 1.1): d/t hypotension, ATN, contrast and NSAIDs +/- vanc Non anion gap metabolic acidosis -->resolved +Volume Overload  Intermittent fluid and electrolyte d/o -crrt started 5/17 P:   Cont CRRT per renal F/u am labs  GASTROINTESTINAL A:   Hepatic Failure  Elevated Ammonia  Protein calorie malnutrition P:   Cont TFs ppi vt Trend LFTs (ordered for 5/21)  HEMATOLOGIC A:   Thrombocytopenia-->resolved.  Normocytic anemia Leukocytosis (? Steroid??) P:  Trend cbc [place] heparin Transfuse per protocol   INFECTIOUS A:   MRSE Epidural Abscess s/p discectomy on 5/2, IR on 5/11 for aspiration P:   dapto per ID (6-8 week course)  ENDOCRINE A:   Hypopituitarism  Hypothyroid    P:   No change in solucortef Cont syntrhoid  NEUROLOGIC A:   Acute Metabolic Encephalopathy in setting of uremia and elevated ammonia  EEG with diffuse slowing, non-specific -over sedated this am P:   Transition to precedex Add back prozac Add back seroquel qhs  FAMILY -->sister updated 5/19  Discussion Volume overload and delirium major barriers to progress. For today: start precedex w/ major goal to at-least get fent gtt to 1/2. Add home seroquel and prozac, cont volume removal as tolerates. WIll support w/ neo to allow [place] to use precedex POC d/w RN at bedside.  My ccm time 38 minutes.   Erick Colace ACNP-BC [place] Pager # [phone] OR # [phone] if no answer

## 2017-03-14 NOTE — Progress Notes (Signed)
eLink Physician-Brief Progress Note Patient Name: Lawrence Moore DOB: [birth year] MRN: [id number]   Date of Service  03/14/2017  HPI/Events of Note  Agitation - Nurse requests increasing Versed dose to Q 1 hour.  eICU Interventions  Will order: 1. Incrase Versed to 1-2 mg IV Q 1 hour PRN agitation.     Intervention Category Minor Interventions: Agitation / anxiety - evaluation and management  [name] 03/14/2017, 5:44 PM

## 2017-03-14 NOTE — Progress Notes (Signed)
MD Oletta Darter called to make aware that pt's PRN versed order to maintain RASS is insufficient. Pt is requiring versed more that q2 hours. During his periods of agitation, the pt becomes bradycardic (in 20-30's) and hypoxic (SaO2 <83%). Request for an increase in versed frequency or change to drip made  Lorre Munroe

## 2017-03-15 ENCOUNTER — Inpatient Hospital Stay (HOSPITAL_COMMUNITY): Payer: Medicare Other

## 2017-03-15 DIAGNOSIS — J9602 Acute respiratory failure with hypercapnia: Secondary | ICD-10-CM

## 2017-03-15 LAB — RENAL FUNCTION PANEL
ANION GAP: 12 (ref 5–15)
Albumin: 3.1 g/dL — ABNORMAL LOW (ref 3.5–5.0)
BUN: 37 mg/dL — ABNORMAL HIGH (ref 6–20)
CHLORIDE: 99 mmol/L — AB (ref 101–111)
CO2: 24 mmol/L (ref 22–32)
Calcium: 9.1 mg/dL (ref 8.9–10.3)
Creatinine, Ser: 1.46 mg/dL — ABNORMAL HIGH (ref 0.61–1.24)
GFR calc non Af Amer: 49 mL/min — ABNORMAL LOW (ref >60)
GFR, EST AFRICAN AMERICAN: 57 mL/min — AB (ref >60)
GLUCOSE: 143 mg/dL — AB (ref 65–99)
POTASSIUM: 4.1 mmol/L (ref 3.5–5.1)
Phosphorus: 4.1 mg/dL (ref 2.5–4.6)
Sodium: 135 mmol/L (ref 135–145)

## 2017-03-15 LAB — HEPATIC FUNCTION PANEL
ALK PHOS: 218 U/L — AB (ref 38–126)
ALT: 152 U/L — AB (ref 17–63)
AST: 134 U/L — AB (ref 15–41)
Albumin: 3.1 g/dL — ABNORMAL LOW (ref 3.5–5.0)
Bilirubin, Direct: 0.2 mg/dL (ref 0.1–0.5)
Indirect Bilirubin: 0.7 mg/dL (ref 0.3–0.9)
TOTAL PROTEIN: 8.1 g/dL (ref 6.5–8.1)
Total Bilirubin: 0.9 mg/dL (ref 0.3–1.2)

## 2017-03-15 LAB — GLUCOSE, CAPILLARY
GLUCOSE-CAPILLARY: 106 mg/dL — AB (ref 65–99)
GLUCOSE-CAPILLARY: 83 mg/dL (ref 65–99)
Glucose-Capillary: 107 mg/dL — ABNORMAL HIGH (ref 65–99)

## 2017-03-15 LAB — CBC
HCT: 42.6 % (ref 39.0–52.0)
HEMOGLOBIN: 13.7 g/dL (ref 13.0–17.0)
MCH: 30.2 pg (ref 26.0–34.0)
MCHC: 32.2 g/dL (ref 30.0–36.0)
MCV: 93.8 fL (ref 78.0–100.0)
Platelets: 297 10*3/uL (ref 150–400)
RBC: 4.54 MIL/uL (ref 4.22–5.81)
RDW: 15.1 % (ref 11.5–15.5)
WBC: 28.7 10*3/uL — AB (ref 4.0–10.5)

## 2017-03-15 LAB — APTT: aPTT: 99 seconds — ABNORMAL HIGH (ref 24–36)

## 2017-03-15 LAB — POCT ACTIVATED CLOTTING TIME
ACTIVATED CLOTTING TIME: 202 s
ACTIVATED CLOTTING TIME: 208 s
Activated Clotting Time: 213 seconds
Activated Clotting Time: 197 seconds

## 2017-03-15 LAB — MAGNESIUM: Magnesium: 2.8 mg/dL — ABNORMAL HIGH (ref 1.7–2.4)

## 2017-03-15 MED ORDER — SODIUM CHLORIDE 0.9 % IV SOLN
0.0300 [IU]/min | INTRAVENOUS | Status: DC
Start: 2017-03-15 — End: 2017-03-15
  Administered 2017-03-15: 0.03 [IU]/min via INTRAVENOUS
  Filled 2017-03-15: qty 2

## 2017-03-15 MED ORDER — PANTOPRAZOLE SODIUM 40 MG IV SOLR
40.0000 mg | INTRAVENOUS | Status: DC
  Administered 2017-03-15: 40 mg via INTRAVENOUS
  Filled 2017-03-15: qty 40

## 2017-03-15 MED ORDER — HYDROCORTISONE NA SUCCINATE PF 100 MG IJ SOLR
50.0000 mg | Freq: Four times a day (QID) | INTRAMUSCULAR | Status: DC
  Administered 2017-03-15: 50 mg via INTRAVENOUS
  Filled 2017-03-15: qty 2
  Filled 2017-03-15: qty 1

## 2017-03-16 ENCOUNTER — Telehealth: Payer: Self-pay

## 2017-03-16 ENCOUNTER — Ambulatory Visit (HOSPITAL_COMMUNITY): Payer: Self-pay | Admitting: Psychiatry

## 2017-03-16 NOTE — Telephone Encounter (Signed)
On 03/16/2017 I received a death certificate from [facility] (original). The death certificate is for cremation. The patient is a patient of Doctor Sood. The death certificate will be taken to [facility] ([address]) this pm for signature.  On 2017-03-21 I received the death certificate back from [facility] [place]. I got the death certificate ready and called the funeral home to let them know the death certificate is ready for pickup. I also faxed a copy to the funeral home per the funeral home request.

## 2017-03-26 NOTE — Addendum Note (Signed)
Addendum  created 03/26/17 1433 by Rica Koyanagi, MD   Sign clinical note

## 2017-03-26 NOTE — Discharge Summary (Signed)
Lawrence Moore was a 66 y.o. male admitted on 03/24/2017 with back pain.  He had Rt lumbar 4-5 microdiscectomy on 02/24/17.  He was found to have spinal epidural abscess.  He was started on antibiotics.  He had this drained by IR.  He developed altered mental status and progressive hypoxia.  He required intubation.  He was seen by nephrology for acute renal failure.  He was started on CRRT.  He had refractory hypotension and required solu cortef.  He did not show signs of improvement and continued to get worse.  Family opted for DNR status.  He subsequently expired on 2017-03-17.  Final diagnoses: Spinal epidural abscess with Staph epidermidis Acute hypoxic/hypercapnic respiratory failure Acute pulmonary edema Acute renal failure with ATN Hx of COPD Hx of DM type II Hypopituitarism Hypotension secondary to relative adrenal insufficiency Hepatic failure Elevated ammonia Moderate protein calorie malnutrition Thrombocytopenia Anemia of critical illness Acute metabolic encephalopathy Hepatic encephalopathy   Chesley Mires, MD [facility] Pulmonary/Critical Care 03/07/2017, 2:54 PM

## 2017-03-26 NOTE — Progress Notes (Signed)
PULMONARY / CRITICAL CARE MEDICINE   Name: Lawrence Moore MRN: [id number] DOB: Jul 17, 1951    ADMISSION DATE:  02/27/2017 CONSULTATION DATE:  03/08/2017  REFERRING MD:  Dr. Kendrick Fries   CHIEF COMPLAINT:  Spinal abscess post right lumbar microdiscectomy  Brief:   66 year old male with PMH of anxiety, asthma, depression, DM, GERD, Hyperlipidemia, hypothyroidism, COPD. Admitted 5/10 with a spinal epidural abscess after a routine lumbar laminectomy for a disc rupture one week prior. MRI revealed new L4-L5 abscess. Underwent IR aspiration on 5/11. Pain had been hard to control and patient was requiring multiple PRN doses of dilaudid. Later in the day on 5/11 patient was found unresponsive with oxygen saturation in the 40s. He was given Narcan and responded well. However, later in the day patient was found aphasic and altered, again responsive to narcan, Neurology consulted, Head CT negative. Patient transferred to ICU and placed on BiPAP, ABG 7.2/50/82. Patient with liver and renal failure with elevated ammonia level. Nephrology consulted. Overnight 5/14 patient SOB requiring lasix administration and ultimately intubation.   SUBJECTIVE:  Progressive hypotension.  VITAL SIGNS: BP (!) 57/40   Pulse (!) 57   Temp (!) 95.9 F (35.5 C) (Axillary)   Resp (!) 37   Ht 5\' 4"  (1.626 m)   Wt 211 lb 3.2 oz (95.8 kg)   SpO2 (!) 85%   BMI 36.25 kg/m   VENTILATOR SETTINGS: Vent Mode: PRVC FiO2 (%):  [30 %-40 %] 40 % Set Rate:  [35 bmp] 35 bmp Vt Set:  [480 mL] 480 mL PEEP:  [5 cmH20] 5 cmH20 Plateau Pressure:  [24 cmH20-31 cmH20] 28 cmH20  INTAKE / OUTPUT:  Intake/Output Summary (Last 24 hours) at 2017-03-25 0959 Last data filed at 03-25-2017 0900  Gross per 24 hour  Intake          2265.77 ml  Output             7048 ml  Net         -4782.23 ml    General - unresponsive Eyes - pupils pinpoint ENT - ETT in place Cardiac - regular, no murmur Chest - b/l rhonchi Abd - soft, decreased bowel  sounds Ext - 1+ edema Skin - cool Neuro - not following commands  LABS:  BMET  Recent Labs Lab 03/14/17 0402 03/14/17 1520 03-25-2017 0425  NA 138 136 135  K 4.0 3.9 4.1  CL 101 103 99*  CO2 27 25 24   BUN 42* 41* 37*  CREATININE 1.48* 1.55* 1.46*  GLUCOSE 113* 130* 143*    Electrolytes  Recent Labs Lab 03/13/17 0317  03/14/17 0402 03/14/17 1520 03-25-2017 0420 2017-03-25 0425  CALCIUM 8.4*  < > 8.7* 8.8*  --  9.1  MG 2.4  --  2.5*  --  2.8*  --   PHOS  --   < > 3.3 4.1  --  4.1  < > = values in this interval not displayed.  CBC  Recent Labs Lab 03/13/17 0317 03/14/17 0402 03-25-2017 0420  WBC 16.2* 16.7* 28.7*  HGB 10.6* 11.3* 13.7  HCT 32.2* 34.8* 42.6  PLT 206 231 297    Coag's  Recent Labs Lab 03/10/17 1144  03/13/17 0317 03/14/17 0402 03-25-17 0420  APTT 34  < > 102* 114* 99*  INR 1.20  --   --   --   --   < > = values in this interval not displayed.  Sepsis Markers  Recent Labs Lab 03/09/17 [phone]  PROCALCITON 20.21    ABG  Recent Labs Lab 03/11/17 1250 03/12/17 0930 03/13/17 0349  PHART 7.387 7.421 7.374  PCO2ART 44.2 43.7 49.7*  PO2ART 323* 66.8* 64.7*    Liver Enzymes  Recent Labs Lab 03/12/17 0321  03/14/17 1520 04/12/17 0420 April 12, 2017 0425  AST 96*  --   --  134*  --   ALT 67*  --   --  152*  --   ALKPHOS 141*  --   --  218*  --   BILITOT 0.7  --   --  0.9  --   ALBUMIN 2.2*  2.2*  < > 2.6* 3.1* 3.1*  < > = values in this interval not displayed.  Cardiac Enzymes No results for input(s): TROPONINI, PROBNP in the last 168 hours.  Glucose  Recent Labs Lab 03/14/17 1218 03/14/17 1626 03/14/17 2014 03/14/17 2351 04-12-17 0438 2017/04/12 0749  GLUCAP 102* 99 85 86 106* 107*    Imaging Dg Chest Port 1 View  Result Date: April 12, 2017 CLINICAL DATA:  Pulmonary edema, shortness of breath. EXAM: PORTABLE CHEST 1 VIEW COMPARISON:  Radiograph of Mar 22, 2017. FINDINGS: Stable cardiomediastinal silhouette. No  pneumothorax is noted. Right internal jugular catheter is stable with distal tip in expected position of the SVC. Endotracheal and nasogastric tubes are unchanged in position. Left lung is clear. Mildly improved right basilar opacity is noted suggesting improving atelectasis or pneumonia. Bony thorax is unremarkable. IMPRESSION: Stable support apparatus. Slightly decreased right basilar opacity suggesting improving atelectasis or pneumonia. Electronically Signed   By: Marijo Conception, M.D.   On: April 12, 2017 07:13    STUDIES:  MRI L-SPINE W/ & W/O 5/10: Status post right L4 laminectomy with fluid collection in the operative bed causing mass effect on the dorsal aspect of the thecal sac with worsened narrowing of thecal sac compared to the preoperative study. 2. Unchanged distortion of the right lateral recess at L5-S1 secondary to the presence of granulation tissue. PORT CXR 5/11:  Personally reviewed by me. Patchy opacification within right mid and lower lung zone. Full left hilum. No pleural effusion appreciated. CT HEAD/ANGIO NECK/HEAD 5/12: Negative for emergent large vessel occlusion. 2. Diminutive intracranial vessels and moderate distal basilar stenosis. 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 3. High-grade stenosis at the origin of the non dominant right vertebral artery. 4. No carotid stenosis in the neck. COMPLETE ABD U/S 5/13:  Surgical absence of the gallbladder. No bile duct dilatation. Severe fatty infiltration of the liver. Minimal perinephric fluid noted along the lower pole of the left kidney, nonspecific. EEG 5/14 > Moderate diffuse slowing of the background, focal slowing over the right frontal region   CULTURES: MRSA PCR 4/30:  Negative  Blood Cultures x2 5/10 >>>neg Urine Culture 5/10:  Negative  HIV 5/11:  Nonreactive  L-4 Laminectomy Bed Culture 5/11 > MDR Staph epidermidis (sens to [place], [place], tetracycline Blood Cultures x2 5/12 >>>neg  Stool C diff PCR  5/12:  Negative   ANTIBIOTICS: Rocephin 5/11 >>>5/15 Vancomycin 5/11 >>>5/11 dapto 5/11>>>  SIGNIFICANT EVENTS: 5/2 > L4-5 microdiscectomy  5/10> Admitted  5/11 > IR aspiration, respiratory distress responded to narcan, another event later in day did not respond, code stroke called > negative  5/12 > Solu-Cortef 100 q8h (Patient on Prednisone 5 mg outpatient)  5/13 > Solu-Cortef decreased to basal dosing  5/15 > Intubated  5/17 > start CRRT 5/19 > hypotensive. Required vasopressor support.  5/20 > off pressors. ? R/t diprivan ?? Starting  precedex. Supporting w/ neo. Attempting to cut fent back.  DNR. 5/21 > back on pressors, worse mental status  LINES/TUBES: ETT 5/15 >>  Right IJ HD 5/18>>  ASSESSMENT / PLAN:  PULMONARY A: Acute on Chronic Hypercarbic respiratory Failure in sitting of pulmonary edema with refractory volume overload. H/O COPD. P:   Continue full vent support Not stable for trach >> family would not want this  CARDIOVASCULAR A:  Refractory hypotension. P:  Pressors to keep MAP > 65, added vasopression 5/21  RENAL A:   Acute renal failure (Bas Cr 1.1): d/t hypotension, ATN, contrast and NSAIDs +/- vanc P:   CRRT >> unable to remove volume 5/21 due to hypotension  GASTROINTESTINAL A:   Hepatic Failure  Elevated Ammonia  Protein calorie malnutrition P:   F/u LFTs  HEMATOLOGIC A:   Thrombocytopenia-->resolved.  Normocytic anemia P:  Trend cbc  INFECTIOUS A:   MRSE Epidural Abscess s/p discectomy on 5/2, IR on 5/11 for aspiration P:   dapto per ID (6-8 week course)  ENDOCRINE A:   Hypopituitarism  Hypothyroid    P:   Change solu cortef to IV and increase to 50 mg q6h in setting of hypotenstion 5/21 Continue synthroid F/u TSH  NEUROLOGIC A:   Acute Metabolic Encephalopathy in setting of uremia and elevated ammonia. P:   RASS goal 0 to -1  Updated pt's sister (POA) at bedside.  Explained that he is not making progress, and is  in fact getting worse.  Family would not want long term support w/o hope of meaningful functional status.  They had relative pass away last week, and have funeral arrangements tomorrow.  They would like to delay any further decisions about goals of care until after this, but understand he might not be able to sustain himself until then.  CC time 33 minutes  D/w Dr. Marcellus Scott, MD [place] Mar 21, 2017, 10:06 AM Pager:  [phone] After 3pm call: [phone]

## 2017-03-26 NOTE — Progress Notes (Signed)
   2017-04-07 1320  Clinical Encounter Type  Visited With Patient and family together  Visit Type Death  Spiritual Encounters  Spiritual Needs Prayer  Stress Factors  Patient Stress Factors Major life changes  Family Stress Factors Family relationships  Introduction to family. [place] for departed Pt. Offered grief support to family.

## 2017-03-26 NOTE — Progress Notes (Signed)
Patient TOD 1310. Pronounced by [place], RN3 and Laurena Spies, RN. No cardiac or breath sounds auscultated. No carotid or femoral pulse. Pupils non-reactive. Dr. Halford Chessman and [place] notified. Family at bedside.

## 2017-03-26 NOTE — Progress Notes (Signed)
50cc Fentanyl gtt wasted in sink. Witnessed by Leanord Hawking, RN, AD

## 2017-03-26 NOTE — Procedures (Signed)
Patient on CRRT  Sister caregiver in room  UF 150-200   Hypotension requiring pressors  Vent Dependent  - looking at trach  K 4.1  Na 135  CO2  24     Ca 9.1   Phos 4.1  H 13.7   Ongoing end of life discussions

## 2017-03-26 DEATH — deceased

## 2017-07-14 ENCOUNTER — Ambulatory Visit: Payer: Medicare Other | Admitting: Internal Medicine

## 2019-01-19 IMAGING — MR MR LUMBAR SPINE WO/W CM
4 of 8 series · 24 of 48 positions shown · IV contrast (multihance)
Comparison: Lumbar MRI 12/12/2015, and earlier

CLINICAL DATA: 65-year-old male with lumbar pain radiating down the
right leg to the foot for 2 months. Prior surgery, most recently in
December 2015.

EXAM:
MRI LUMBAR SPINE WITHOUT AND WITH CONTRAST
TECHNIQUE: Multiplanar and multiecho pulse sequences of the lumbar spine were
obtained without and with intravenous contrast.
CONTRAST:  20mL MULTIHANCE GADOBENATE DIMEGLUMINE 529 MG/ML IV SOLN

[Series 3: T2 post-contrast · sagittal · 4.0mm · 0.55mm/px · 5 of 13 slices shown]
[im 1/13]
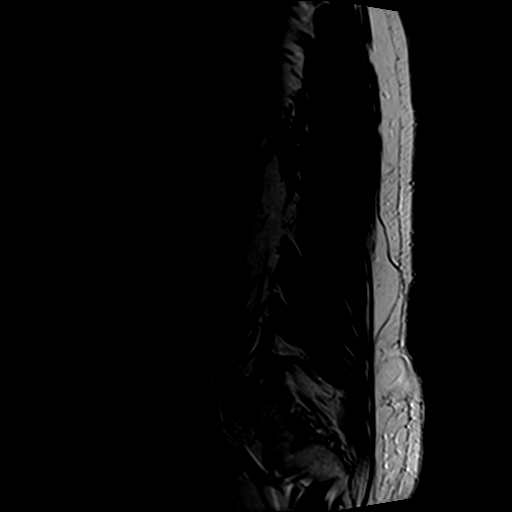
[im 4/13]
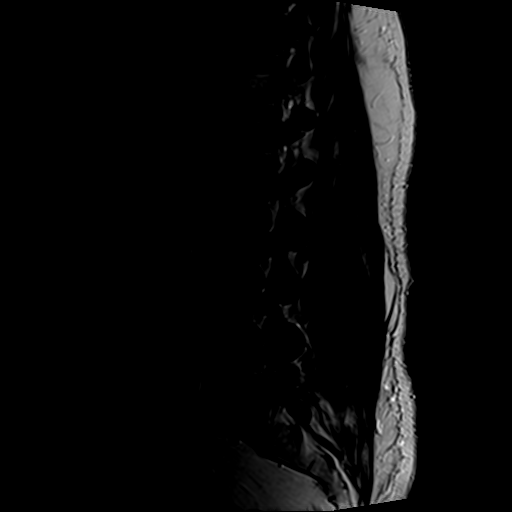
[im 7/13]
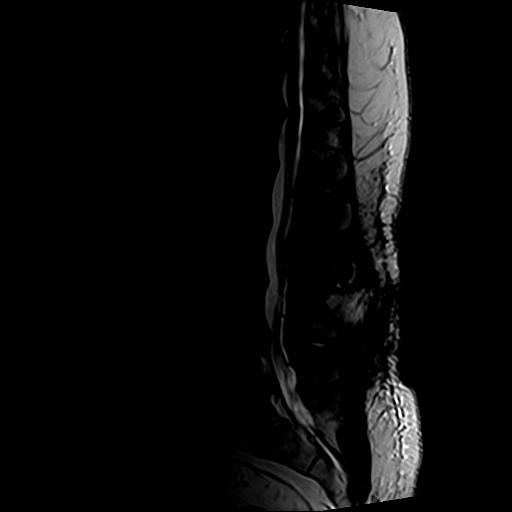
[im 10/13]
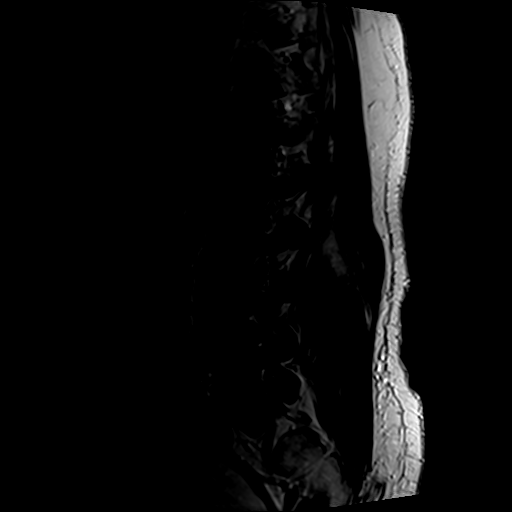
[im 13/13]
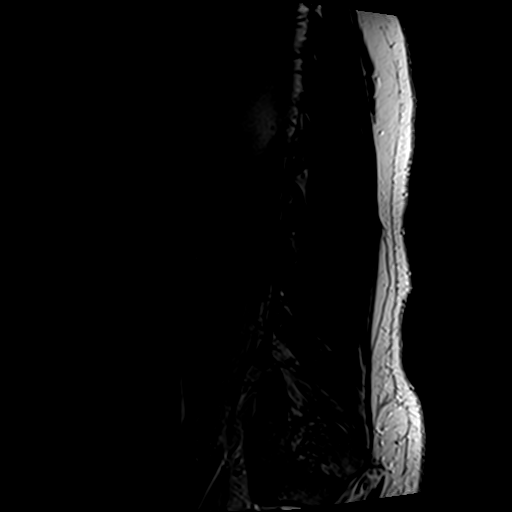

[Series 5: T1 · sagittal · 4.0mm · 0.55mm/px · 4 of 13 slices shown (1 of 2)]
[im 1/13]
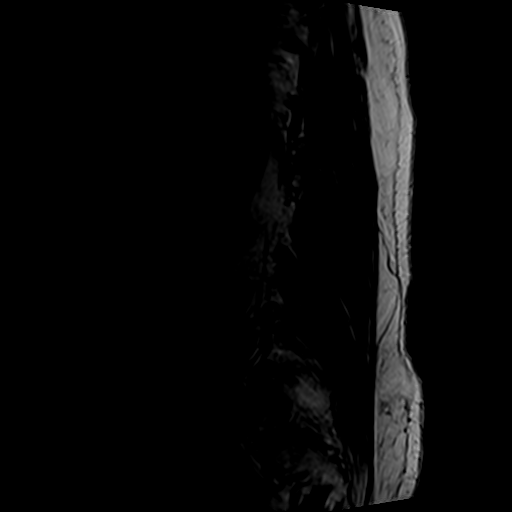
[im 5/13]
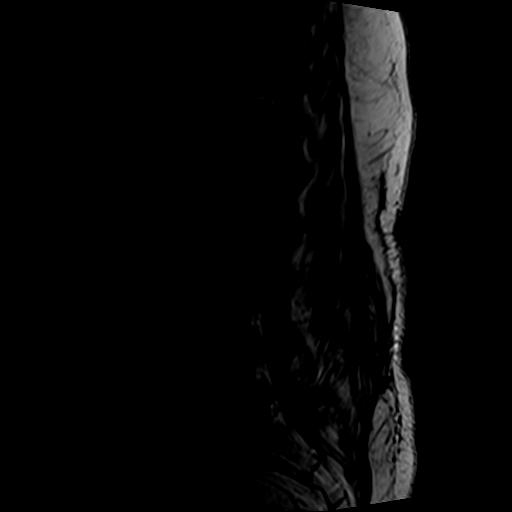
[im 9/13]
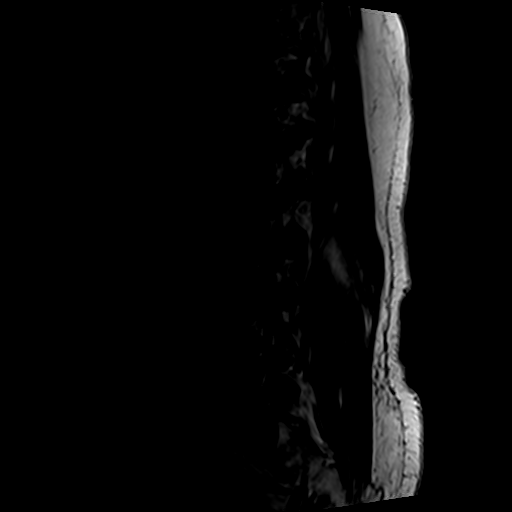
[im 13/13]
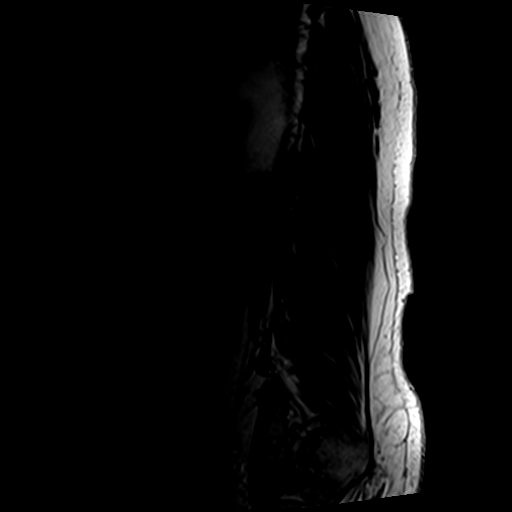

[Series 6: T1 · axial · 4.0mm · 0.35mm/px · z∈[-130,+16]mm · 6 of 31 slices shown (2 of 2)]
[im 1/31]
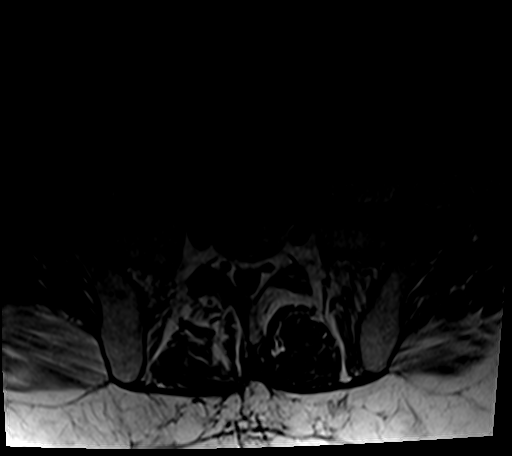
[im 4/31]
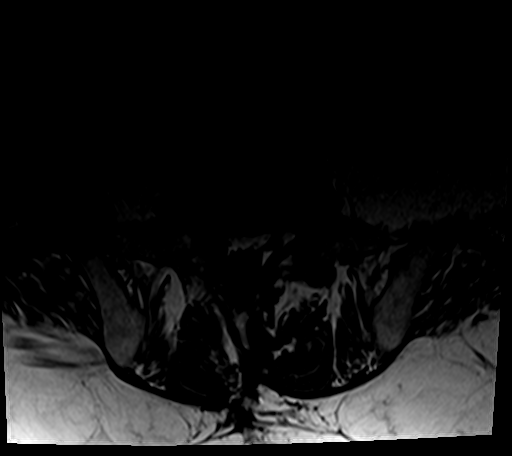
[im 8/31]
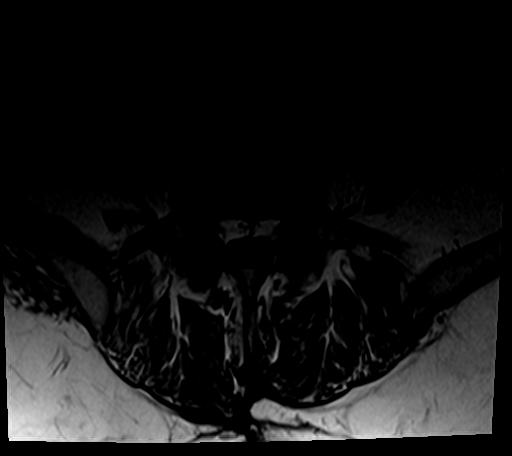
[im 12/31]
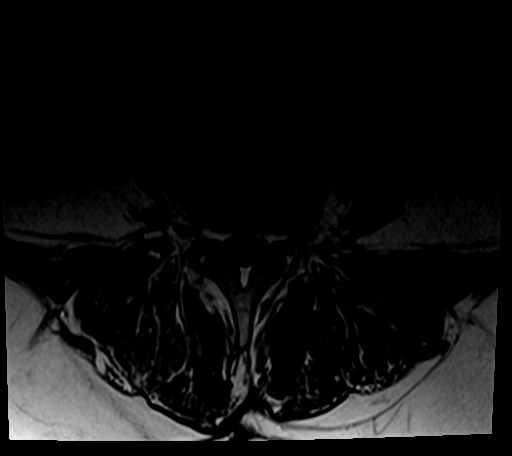
[im 16/31]
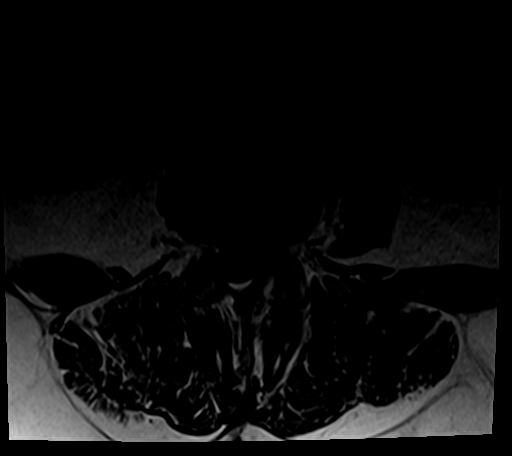
[im 27/31]
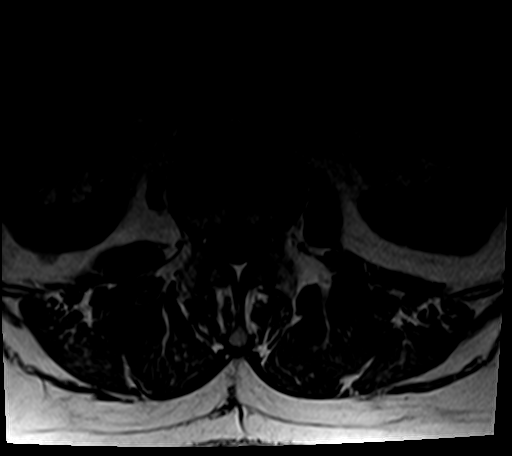

[Series 7: T2 · axial · 4.0mm · 0.70mm/px · z∈[-130,+36]mm · 9 of 31 slices shown]
[im 1/31]
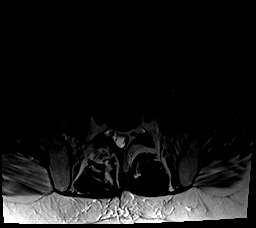
[im 4/31]
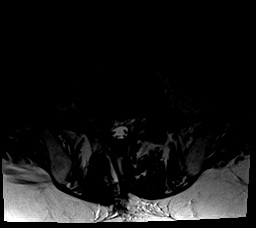
[im 8/31]
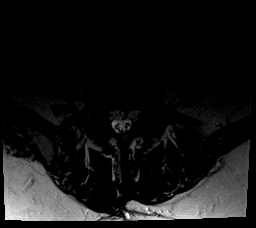
[im 12/31]
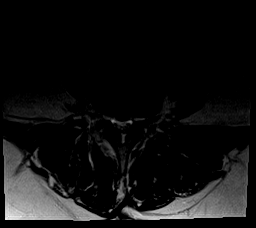
[im 16/31]
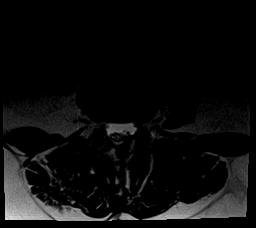
[im 19/31]
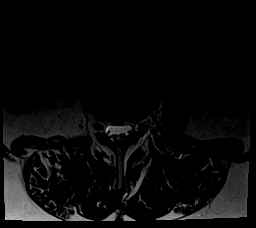
[im 23/31]
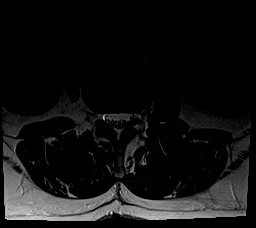
[im 27/31]
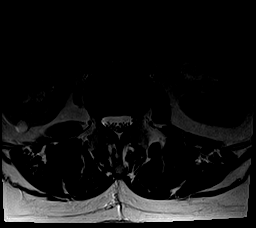
[im 31/31]
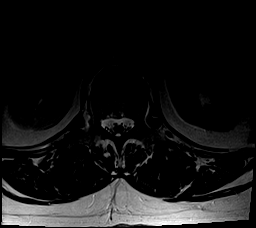

[24 of 48 positions shown; findings below may reference images not displayed]

FINDINGS: Segmentation: Normal, the same numbering system used on the prior
MRIs.

Alignment: Chronic straightening of lumbar lordosis. Stable
vertebral height and alignment.

Vertebrae: No marrow edema or evidence of acute osseous abnormality.
Visualized bone marrow signal is within normal limits. Negative
visible sacrum.

Conus medullaris: Extends to the L1-L2 level and appears normal. No
abnormal intradural enhancement.

Paraspinal and other soft tissues: Negative; mild postoperative
changes to the posterior paraspinal soft tissues at L4 and L5.

Disc levels:

T10-T11: Mild disc bulge.  Mild facet hypertrophy.

T11-T12:  Stable mild broad-based posterior disc bulge.

T12-L1: Stable circumferential disc bulge with broad-based posterior
component resulting in borderline to mild spinal stenosis (series 3,
image 7).

L1-L2:  Stable mild mostly far lateral disc bulge.  No stenosis.

L2-L3: Stable mild circumferential disc bulge and endplate spurring.
No stenosis.

L3-L4: Previous right laminectomy. Stable mild circumferential disc
bulge with broad-based posterior component. Stable mild endplate
spurring. Stable mild left facet hypertrophy. No stenosis.

L4-L5: Chronic circumferential disc bulge. Interval increased
central to slightly right paracentral broad-based disc protrusion
(series 7, image 21). Increased mass effect on the ventral thecal
sac. Stable mild facet and ligament flavum hypertrophy. Stable mild
epidural lipomatosis. Increased mild to moderate spinal stenosis.
Disc material in proximity to the descending right L5 nerve roots in
the lateral recess on series 7, image 22. Mild endplate spurring.
Stable mild left L4 foraminal stenosis.

L5-S1: Postoperative changes on the right including laminectomy and
partial discectomy at the right lateral recess. Architectural
distortion at the right lateral recess is homogeneous Kari-Pekka enhancing
suggesting granulation tissue despite an area of T2 heterogeneity
adjacent to the descending right S1 nerve roots. See series 10,
image 28 and series 7, image 27. Underlying circumferential disc
bulge and endplate spurring with broad-based posterior component.
Moderate chronic facet hypertrophy. No spinal or definite lateral
recess stenosis. Mild to moderate right L5 foraminal stenosis in
part due to endplate spurring has not significantly changed.
IMPRESSION: 1. Since the prior MRI in November 2015 a broad-based L4-L5 disc
protrusion has progressed along with mild to moderate spinal
stenosis at that level, and this could be a source for right L5
radiculitis.
2. Postoperative changes on the right at L5-S1 with right lateral
recess architectural distortion felt due to granulation tissue. No
spinal or convincing lateral recess stenosis at that level. There is
mild to moderate multifactorial right L5 foraminal stenosis.
3. Other lumbar levels are stable without stenosis.
4. Chronic mild spinal stenosis at T12-L1 related to stable
broad-based disc protrusion.
